# Patient Record
Sex: Male | Born: 1935 | Race: White | Hispanic: No | Smoking: Former smoker
Health system: Southern US, Community
[De-identification: ages and names within clinical notes are randomized; demographics above are authoritative.]

## PROBLEM LIST (undated history)

## (undated) DIAGNOSIS — I2510 Atherosclerotic heart disease of native coronary artery without angina pectoris: Secondary | ICD-10-CM

## (undated) DIAGNOSIS — I639 Cerebral infarction, unspecified: Secondary | ICD-10-CM

## (undated) DIAGNOSIS — H3530 Unspecified macular degeneration: Secondary | ICD-10-CM

## (undated) DIAGNOSIS — E785 Hyperlipidemia, unspecified: Secondary | ICD-10-CM

## (undated) DIAGNOSIS — I10 Essential (primary) hypertension: Secondary | ICD-10-CM

## (undated) DIAGNOSIS — M545 Low back pain, unspecified: Secondary | ICD-10-CM

## (undated) DIAGNOSIS — G8929 Other chronic pain: Secondary | ICD-10-CM

## (undated) DIAGNOSIS — F329 Major depressive disorder, single episode, unspecified: Secondary | ICD-10-CM

## (undated) DIAGNOSIS — H9319 Tinnitus, unspecified ear: Secondary | ICD-10-CM

## (undated) DIAGNOSIS — I350 Nonrheumatic aortic (valve) stenosis: Secondary | ICD-10-CM

## (undated) DIAGNOSIS — F4310 Post-traumatic stress disorder, unspecified: Secondary | ICD-10-CM

## (undated) DIAGNOSIS — R569 Unspecified convulsions: Secondary | ICD-10-CM

## (undated) DIAGNOSIS — N529 Male erectile dysfunction, unspecified: Secondary | ICD-10-CM

## (undated) DIAGNOSIS — F419 Anxiety disorder, unspecified: Secondary | ICD-10-CM

## (undated) DIAGNOSIS — F32A Depression, unspecified: Secondary | ICD-10-CM

## (undated) HISTORY — DX: Post-traumatic stress disorder, unspecified: F43.10

## (undated) HISTORY — DX: Hyperlipidemia, unspecified: E78.5

## (undated) HISTORY — DX: Anxiety disorder, unspecified: F41.9

## (undated) HISTORY — DX: Male erectile dysfunction, unspecified: N52.9

## (undated) HISTORY — PX: ELBOW SURGERY: SHX618

## (undated) HISTORY — DX: Tinnitus, unspecified ear: H93.19

## (undated) HISTORY — PX: EYE SURGERY: SHX253

## (undated) HISTORY — PX: CATARACT EXTRACTION, BILATERAL: SHX1313

## (undated) HISTORY — DX: Cerebral infarction, unspecified: I63.9

## (undated) HISTORY — DX: Unspecified macular degeneration: H35.30

---

## 2001-09-15 ENCOUNTER — Encounter: Admission: RE | Admit: 2001-09-15 | Discharge: 2001-09-15 | Payer: Self-pay | Admitting: Neurosurgery

## 2004-11-06 ENCOUNTER — Ambulatory Visit: Payer: Self-pay | Admitting: Unknown Physician Specialty

## 2006-11-03 ENCOUNTER — Emergency Department (HOSPITAL_COMMUNITY): Admission: EM | Admit: 2006-11-03 | Discharge: 2006-11-03 | Payer: Self-pay | Admitting: Emergency Medicine

## 2009-09-11 DIAGNOSIS — I350 Nonrheumatic aortic (valve) stenosis: Secondary | ICD-10-CM | POA: Insufficient documentation

## 2009-11-15 LAB — PULMONARY FUNCTION TEST

## 2012-07-15 ENCOUNTER — Telehealth: Payer: Self-pay

## 2012-07-15 NOTE — Telephone Encounter (Signed)
Chart in box 

## 2012-07-15 NOTE — Telephone Encounter (Signed)
Pt called for Chelle. Needs a letter for a [facility] claim stating that his PTSD meds (alprazolam) have caused his ED.  [phone]  Please note I have reported to [facility] in regards to his "deceased" status.

## 2012-07-16 ENCOUNTER — Encounter: Payer: Self-pay | Admitting: Physician Assistant

## 2012-07-17 ENCOUNTER — Telehealth: Payer: Self-pay

## 2012-07-17 NOTE — Telephone Encounter (Signed)
See other phone message. Chelle wrote letter and it's in the letter pool, but we need to know where to send it. LMOM on number provided on that phone message. The phone number in chart is a different area code and that number was out of service.

## 2012-07-18 NOTE — Telephone Encounter (Signed)
Spoke with pt. Note up front for p/u.

## 2012-07-21 NOTE — Telephone Encounter (Signed)
Who is this person, and what does he need to discuss?  I do not see his name on the patient's contacts page as someone we can speak to about the patient's protected information.

## 2012-07-21 NOTE — Telephone Encounter (Signed)
Chelle,  Gerome Sam called regarding the letter that you wrote for this patient for [employer].   Ph#: [phone]

## 2012-07-22 NOTE — Telephone Encounter (Signed)
I called the number and advised Samuel Vega we do not have a release to speak to him, he is with an attorney office and represents Samuel Nygaard. I advised I can not disclose any information.

## 2012-07-22 NOTE — Telephone Encounter (Signed)
Agreed. I'll need what details they "ned the letter to say."  I will accommodate them if appropriate.

## 2012-07-22 NOTE — Telephone Encounter (Signed)
Mr Mayford Knife states you sent a letter concerning this patient, but it is not "exactly" what they need. I have advised Mr Mayford Knife to have patient come in to discuss with [place].

## 2012-07-29 ENCOUNTER — Encounter: Payer: Self-pay | Admitting: Physician Assistant

## 2012-07-29 ENCOUNTER — Ambulatory Visit (INDEPENDENT_AMBULATORY_CARE_PROVIDER_SITE_OTHER): Payer: Medicare Other | Admitting: Physician Assistant

## 2012-07-29 VITALS — BP 178/87 | HR 62 | Temp 97.7°F | Resp 16 | Ht 69.0 in | Wt 208.0 lb

## 2012-07-29 DIAGNOSIS — H9319 Tinnitus, unspecified ear: Secondary | ICD-10-CM

## 2012-07-29 DIAGNOSIS — H9190 Unspecified hearing loss, unspecified ear: Secondary | ICD-10-CM

## 2012-07-29 DIAGNOSIS — N529 Male erectile dysfunction, unspecified: Secondary | ICD-10-CM | POA: Insufficient documentation

## 2012-07-29 DIAGNOSIS — H3530 Unspecified macular degeneration: Secondary | ICD-10-CM | POA: Insufficient documentation

## 2012-07-29 DIAGNOSIS — F4310 Post-traumatic stress disorder, unspecified: Secondary | ICD-10-CM

## 2012-07-29 DIAGNOSIS — E785 Hyperlipidemia, unspecified: Secondary | ICD-10-CM

## 2012-07-29 NOTE — Progress Notes (Signed)
Subjective:    Patient ID: Samuel Vega, male    DOB: 1924-08-07, 78 y.o.   MRN: [id number]  HPI This 78 y.o. male presents for evaluation of reduced hearing.  He has a history of cerumenosis and believes that he needs ear irrigation today.  He sees the audiologist tomorrow to have work done on his bilateral hearing aides.    Additionally, he wants to discuss the adverse effect of his medication.  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  When he uses the alprazolam, he experiences ED symptoms.  I have written a letter to that effect, but the wording may need revision.  It's not clear if this is to support his diagnosis of PTSD or ED. He did not bring any guidance regarding that today, and asks that I call his [facility], Gerome Sam [phone]).  I did so, and had to leave a message.   He believes that he received tetanus and pneumococcal vaccines at the [place] in 05/2012, but does not believe he ever received the shingles vaccine.  It has been "about" 10 years since his last colonoscopy, by his report.  He receives the bulk of his care at the [place], but unfortunately, does not bring records from there, keep notes of his visits, etc.  At this point, we're not even sure what cholesterol medication he's taking.  He previously took simvastatin, but D/C'd it due to fatigue, and I prescribed Crestor.  The patient does not know the name of the cholesterol medication he takes, and I am unable to get through to the pharmacy at the [facility] by phone.  He has an appointment at the [facility] next month, with a psychiatrist, to discuss his PTSD.  He'd like to start citalopram.    When asked about his elevated pressure today, he reports that it's because he's in the office surrounded by "pretty women."  He believes it is normal when he is not here.  Patient Active Problem List  Diagnosis  . Macular degeneration  . Tinnitus  .  Cardiac murmur  . ED (erectile dysfunction)  . Hyperlipidemia  . PTSD (post-traumatic stress disorder)  . Hearing loss   Past Surgical History  Procedure Date  . Eye surgery    Review of Systems As above. No chest pain, SOB, HA, dizziness, vision change, N/V, diarrhea, constipation, dysuria, urinary urgency or frequency, myalgias, arthralgias or rash.     Objective:   Physical Exam Blood pressure 178/87, pulse 62, temperature 97.7 F (36.5 C), resp. rate 16, height 5\' 9"  (1.753 m), weight 208 lb (94.348 kg). Body mass index is 30.72 kg/(m^2). Well-developed, well nourished WM who is awake, alert and oriented, in NAD. HEENT: He is hard of hearing, even with his hearing aides. [place]/AT, PERRL, EOMI.  Sclera and conjunctiva are clear.  EAC are patent, though with moderate cerumen in each canal, TMs are normal in appearance. Nasal mucosa is pink and moist. OP is clear, dentition in fair repair. Neck: supple, non-tender, no lymphadenopathy, thyromegaly. Heart: RRR, loud murmur, heard best in the aortic space. Lungs: normal effort, CTA Extremities: no cyanosis, clubbing or edema. Skin: warm and dry without rash. Psychologic: good mood and appropriate affect, normal speech and behavior.     Assessment & Plan:   1. Hearing loss   2. PTSD (post-traumatic stress disorder)   3. Tinnitus   4. Hyperlipidemia   5. Elevated BP  He'll proceed  with the audiology visit tomorrow and [facility] visit next month.  I hope to hear back from Mr. Turner and will discuss, with permission from the patient, the letter he needs regarding the adverse effects of alprazolam. I am in support of his starting citalopram, but caution that it may contribute to his ED, though it may reduce his need for alprazolam.  I asked that he check his BP and record it when he is at the pharmacy, and bring it with him at his next visit.  Additionally, I ask that he bring all his medications and any papers he receives from the  [place] to his next visit, and I provided him with a list of the health maintenance items of which we do not have records, that he may have received at the [place], for verification.

## 2012-07-29 NOTE — Patient Instructions (Addendum)
When you come see me, or any other healthcare provider, please bring all your medications in their original bottles, so we can make sure our records are accurate and refill the correct medications. Please also bring any notes from the [place], so that I can keep your records here as current as possible.  One drawback of receiving care in 2 places is that we may inadvertently duplicate testing, or not order a test thinking that the other office already did it.  Please bring me the dates that you received the pneumococcal vaccine and the tetanus vaccine at the [place]. You need a vaccine for shingles (Zostavax). You need a colonoscopy.  Please check your blood pressure at the pharmacy the next time you are there, and write the result down to bring to me.  I have left a message for Samuel Vega to return my call.  I will ask him about the specifics of the letter you need and also about starting you on treatment for PTSD prior to your appointment in January 2014 at the [facility].

## 2012-07-30 ENCOUNTER — Encounter: Payer: Self-pay | Admitting: Physician Assistant

## 2012-09-10 ENCOUNTER — Telehealth: Payer: Self-pay

## 2012-09-10 NOTE — Telephone Encounter (Signed)
I do not see that we have ever written this. Please have him contact his provider in the [place] [facility] for this refill.

## 2012-09-10 NOTE — Telephone Encounter (Signed)
I think the [facility] has [facility] this for him, see note from [facility], she advised okay for him to try this, can we do this?  I think he should get from [place], but want to double check, since she does mention in his OV note. Amy

## 2012-09-10 NOTE — Telephone Encounter (Signed)
PT STATES HE IS IN NEED OF HIS CITALOPRAM . PLEASE CALL [phone]   [facility] [address]

## 2012-09-10 NOTE — Telephone Encounter (Signed)
Thanks I have called him to advise. He states his [facility] appt has been changed because of the weather. He is advised he can come in for this. He plans to come in tomorrow to see Dr Cleta Alberts.

## 2012-09-16 ENCOUNTER — Telehealth: Payer: Self-pay

## 2012-09-16 NOTE — Telephone Encounter (Signed)
Our PA's do not accept medicare patients for primary care, do you think we should call patient to advise?

## 2012-09-16 NOTE — Telephone Encounter (Signed)
No DR/PAs are accepting any new medicare patients for primary care so we would need to call the patient and let them know.

## 2012-09-16 NOTE — Telephone Encounter (Signed)
Chelle,  Pt is coming by to see you around noon on Monday to make you his primary caretaker.  [facility] is not [facility] with the [facility].   Nice fellow  [phone]

## 2012-09-17 NOTE — Telephone Encounter (Signed)
thanks

## 2012-09-20 ENCOUNTER — Ambulatory Visit (INDEPENDENT_AMBULATORY_CARE_PROVIDER_SITE_OTHER): Payer: No Typology Code available for payment source | Admitting: Physician Assistant

## 2012-09-20 VITALS — BP 130/70 | HR 80 | Temp 98.4°F | Resp 18 | Wt 207.0 lb

## 2012-09-20 DIAGNOSIS — N529 Male erectile dysfunction, unspecified: Secondary | ICD-10-CM

## 2012-09-20 DIAGNOSIS — F4310 Post-traumatic stress disorder, unspecified: Secondary | ICD-10-CM

## 2012-09-20 MED ORDER — CITALOPRAM HYDROBROMIDE 20 MG PO TABS: 20.0000 mg | ORAL_TABLET | Freq: Every day | ORAL | Status: DC

## 2012-09-20 MED ORDER — SILDENAFIL CITRATE 100 MG PO TABS: 50.0000 mg | ORAL_TABLET | Freq: Every day | ORAL | Status: DC | PRN

## 2012-09-20 NOTE — Progress Notes (Signed)
Subjective:    Patient ID: Samuel Vega, male    DOB: 1923/11/01, 79 y.o.   MRN: [id number]  HPI This 79 y.o. male presents for prescription refills.  He wants to make it official that I am his PCP for his needs outside of the [place] system, and also to help coordinate the care he receives at the [place].  This is difficult, as he often doesn't recall instructions or follow-through, but I agree to be his "outside" provider, once again.  Today he specifically requests Citalopram 20 mg and treatment for ED.  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  He has since read that chronic/long-term use of alprazolam can cause a multitude of adverse effects, and he believes that his difficulties with memory loss, decreased visual acuity and ED are directly related to its use.  He reports that other providers have tried him on medication "for my memory" and is frustrated that no one suggested that his difficulties could be from the alprazolam.  He wants to take citalopram, based on his reading, but in order to get it from the [place], he has to see a psychiatrist first.  He has an appointment next month, but asks that I write it so he doesn't have to wait.  He still intends to meet with the psychiatrist.  [facility] the [facility] referred him to urology, but when he got there, he told the specialist that he didn't know why he was there, so they didn't have a very productive visit.  He is not currently sexually active, but would like to be.  He also describes some urinary frequency, but not routinely, and isn't interested in additional evaluation of that today.   Past Medical History  Diagnosis Date  . Hyperlipidemia   . Macular degeneration   . 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 Anxiety   . Cardiac murmur   . ED (erectile dysfunction)   . PTSD  (post-traumatic stress disorder)     Past Surgical History  Procedure Laterality Date  . Eye surgery    . Elbow surgery      Prior to Admission medications   Medication Sig Start Date End Date Taking? Authorizing Provider  ALPRAZolam Prudy Feeler) 1 MG tablet Take 1 mg by mouth at bedtime as needed.   Yes Historical Provider, MD  hydrOXYzine (VISTARIL) 25 MG capsule Take 25 mg by mouth 3 (three) times daily as needed for itching.   Yes Historical Provider, MD  triamcinolone (KENALOG) 0.025 % cream Apply topically 2 (two) times daily.   Yes Historical Provider, MD    No Known Allergies  History   Social History  . Marital Status: Divorced    Spouse Name: N/A    Number of Children: 2  . Years of Education: N/A   Occupational History  . retired    Social History Main Topics  . Smoking status: Never Smoker   . Smokeless tobacco: Never Used  . Alcohol Use: No     Comment: none since 1980  . Drug Use: No  . Sexually Active: Not Currently -- Male partner(s)   Other Topics Concern  . Not on file   Social History Narrative   Divorced in [place].    History reviewed. No pertinent family history.   Review of Systems  As above.  No chest pain, SOB, HA, dizziness, vision change, N/V, diarrhea, constipation, dysuria, myalgias, arthralgias or rash.     Objective:   Physical Exam  Blood pressure 130/70, pulse 80, temperature 98.4 F (36.9 C), temperature source Oral, resp. rate 18, weight 207 lb (93.895 kg), SpO2 97.00%. Body mass index is 30.55 kg/(m^2). Well-developed, well nourished WM who is awake, alert and oriented, in NAD. HEENT: [place]/AT, PERRL, EOMI.  Sclera and conjunctiva are clear.  Hearing aid on the LEFT. Neck: supple, non-tender, no lymphadenopathy, thyromegaly. Heart: RRR, loud murmur. Lungs: normal effort, CTA Extremities: no cyanosis, clubbing or edema. Skin: warm and dry without rash. Psychologic: good mood and appropriate affect, normal speech and behavior.      Assessment & Plan:   1. PTSD (post-traumatic stress disorder)  citalopram (CELEXA) 20 MG tablet  2. ED (erectile dysfunction)  sildenafil (VIAGRA) 100 MG tablet   At his next visit, we'll further explore the likelihood that he has BPH causing intermittent urinary symptoms.  Again, I ask for lab results and vaccine info that he received at the [place].

## 2012-09-20 NOTE — Patient Instructions (Signed)
Please bring me the dates that you received the pneumococcal vaccine and the tetanus vaccine at the [place].  You need a vaccine for shingles (Zostavax).  You need a colonoscopy.  Please check your blood pressure at the pharmacy the next time you are there, and write the result down to bring to me.

## 2012-09-30 ENCOUNTER — Telehealth: Payer: Self-pay

## 2012-09-30 NOTE — Telephone Encounter (Signed)
Called patient left message for him to call me back 

## 2012-09-30 NOTE — Telephone Encounter (Signed)
chelle  Patient would like to talk to you about one of his medications [phone]

## 2012-10-07 NOTE — Telephone Encounter (Signed)
Called patient, he states he is having problems with his Celexa Rx. He did not get my message last week. He states he is still having symptoms of PTSD, feels nervous depressed  wants to know if he can increase the dosage.

## 2012-10-07 NOTE — Telephone Encounter (Signed)
Please let him know that it often takes 4-6 weeks to achieve the benefit from the dose of Celexa.  He should RTC in 4-6 weeks and we can discuss increasing the dose at that time.

## 2012-10-08 NOTE — Telephone Encounter (Signed)
Patient advised. He states his brother takes it as well and is higher dose.

## 2012-11-09 ENCOUNTER — Other Ambulatory Visit: Payer: Self-pay | Admitting: Radiology

## 2012-11-09 ENCOUNTER — Ambulatory Visit (INDEPENDENT_AMBULATORY_CARE_PROVIDER_SITE_OTHER): Payer: No Typology Code available for payment source | Admitting: Physician Assistant

## 2012-11-09 VITALS — BP 112/78 | HR 67 | Temp 97.7°F | Resp 18 | Ht 68.5 in | Wt 200.0 lb

## 2012-11-09 DIAGNOSIS — I350 Nonrheumatic aortic (valve) stenosis: Secondary | ICD-10-CM

## 2012-11-09 DIAGNOSIS — E785 Hyperlipidemia, unspecified: Secondary | ICD-10-CM

## 2012-11-09 DIAGNOSIS — N529 Male erectile dysfunction, unspecified: Secondary | ICD-10-CM

## 2012-11-09 DIAGNOSIS — F4310 Post-traumatic stress disorder, unspecified: Secondary | ICD-10-CM

## 2012-11-09 MED ORDER — SILDENAFIL CITRATE 100 MG PO TABS: 50.0000 mg | ORAL_TABLET | Freq: Every day | ORAL | Status: DC | PRN

## 2012-11-09 MED ORDER — CITALOPRAM HYDROBROMIDE 20 MG PO TABS: 40.0000 mg | ORAL_TABLET | Freq: Every day | ORAL | Status: DC

## 2012-11-09 NOTE — Patient Instructions (Addendum)
When you you see the doctor at the [facility] about ED, also tell him/her about the urinary frequency and weak stream as well. Citalopram 20 mg for PTSD.  Take 1.5 tablets daily, then increase to 2 tablets daily.  This medication is not "as needed."  You should take it every day.  Find the dose that works best, and stick with that dose.  Don't adjust the dose "as needed." I encourage you to try the rosuvastatin (Crestor) 10 mg, 1/2 tab daily.  If you get muscle pain with it, let me or your VA physician know. We'd like you to take a medicine in this family for your cholesterol.  If you can't tolerate any of them, then we'll move on to something else. Try a product containing SIMETHICONE to reduce the air in your intestines. For constipation, try Miralax, once capful mixed in 8 ounces of water each day. Purchase a pill splitter at the drug store, and use it to cut your pills as directed.

## 2012-11-09 NOTE — Telephone Encounter (Signed)
Left message for him to advise where I need to fax his Rx

## 2012-11-09 NOTE — Progress Notes (Signed)
Subjective:    Patient ID: Samuel Vega, male    DOB: 1924/04/17, 79 y.o.   MRN: [id number]  HPI This 79 y.o. male presents asking questions about his medications.  His case is complicated by getting care here, at the [place] (by a primary care provider and several specialists) and lack of communication amongst [place].  Despite several requests, made directly to the [place] and via the patient in writing, I have received only one set of labs from 12/2010.  Unfortunately, the patient is a poor historian, and often doesn't remember why he was sent to a particular specialist, which contributes to incomplete care. Has appointments at the [place] on 11/16/2012: hearing, ED, and something else, but he doesn't know what (perhaps psychiatry, regarding the citalopram). "I feel like a different person" taking the citalopram. No longer using alprazolam.  He notes that it doesn't "make me sleepy.  My brother takes it for PTSD and he says that if I take 2 of these I'll sleep really well." Complains that the atorvastatin caused "burning in the muscles, because it gets in the nerves." The [place] provider recently wrote for Crestor, which he hasn't taken it, concerned that it will cause similar symptoms. The nurse at the [facility] suggested Zetia as an alternative, and he also asks about fish oil.  He wants my advice.. Complains of feeling bloated and constipated. Reports that there is "air, it's just empty in there" causing the symptoms.  Initially he said that he wasn't constipated, that he didn't know why the assistant wrote that, but then stated that he is constipated. No blood or melena.  No mucous.  Past Medical History  Diagnosis Date  . Hyperlipidemia   . Macular degeneration   . 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 Anxiety   . Cardiac murmur   . ED (erectile dysfunction)   . PTSD (post-traumatic stress disorder)     Past Surgical History  Procedure Laterality Date  . Eye  surgery    . Elbow surgery      Prior to Admission medications   Medication Sig Start Date End Date Taking? Authorizing Provider  citalopram (CELEXA) 20 MG tablet Take 1 tablet (20 mg total) by mouth daily. 09/20/12  Yes Gwenyth Dingee S [place], PA-C  ALPRAZolam (XANAX) 1 MG tablet Take 1 mg by mouth at bedtime as needed.    Historical Provider, MD  atorvastatin (LIPITOR) 20 MG tablet Take 10 mg by mouth daily.    Historical Provider, MD  hydrOXYzine (VISTARIL) 25 MG capsule Take 25 mg by mouth 3 (three) times daily as needed for itching.    Historical Provider, MD  rosuvastatin (CRESTOR) 10 MG tablet Take 10 mg by mouth daily.    Historical Provider, MD  sildenafil (VIAGRA) 100 MG tablet Take 0.5-1 tablets (50-100 mg total) by mouth daily as needed for erectile dysfunction. 09/20/12   Rydell Wiegel S Reyes Fifield, PA-C  triamcinolone (KENALOG) 0.025 % cream Apply topically 2 (two) times daily.    Historical Provider, MD    No Known Allergies  History   Social History  . Marital Status: Divorced    Spouse Name: N/A    Number of Children: 2  . Years of Education: N/A   Occupational History  . retired    Social History Main Topics  . Smoking status: Never Smoker   . Smokeless tobacco: Never Used  . Alcohol Use: No     Comment:  none since 1980  . Drug Use: No  . Sexually Active: Not Currently -- Male partner(s)   Other Topics Concern  . Not on file   Social History Narrative   Divorced in [place].    History reviewed. No pertinent family history.  Review of Systems No chest pain, SOB, HA, dizziness, vision change, N/V, diarrhea, urinary urgency, myalgias, arthralgias or rash. When he does have back pain he uses celebrex.  Notes that the urinary frequency seems to be improving.    Objective:   Physical Exam  Blood pressure 112/78, pulse 67, temperature 97.7 F (36.5 C), temperature source Oral, resp. rate 18, height 5' 8.5" (1.74 m), weight 200 lb (90.719 kg), SpO2 96.00%. Body mass index  is 29.96 kg/(m^2). Well-developed, well nourished WM who is awake, alert and oriented, in NAD. HEENT: [place]/AT, PERRL, EOMI.  Sclera and conjunctiva are clear.  EAC are patent, TMs are normal in appearance. Nasal mucosa is pink and moist. OP is clear. Neck: supple, non-tender, no lymphadenopathy, thyromegaly. Heart: RRR, 3/6 murmur consistent with AS Lungs: normal effort, CTA Abdomen: normo-active bowel sounds, supple, non-tender, no mass or organomegaly. No shifting dullness on percussion. Extremities: no cyanosis, clubbing or edema. Skin: warm and dry without rash. Psychologic: good mood and appropriate affect, normal speech and behavior.       Assessment & Plan:  PTSD (post-traumatic stress disorder) - Plan: citalopram (CELEXA) 20 MG tablet; increase to 1.5, then 2 tabs daily to see if his sleep improves.  Hyperlipidemia - Plan: encouraged to start the Crestor 10 mg, 1/2 tablet daily.  If he develops muscle pain we'll try something else.  ED (erectile dysfunction) - Plan: sildenafil (VIAGRA) 100 MG tablet  Aortic stenosis - asymptomatic.  Continue annual cardiology evaluation, reassess sooner if develops symptoms.  Bloating/constipation: OTC simethicone and miralax

## 2012-11-10 ENCOUNTER — Telehealth: Payer: Self-pay

## 2012-11-10 NOTE — Telephone Encounter (Signed)
NURSE FROM [facility] DOCTOR[facility] OFFICE CALLING TO HAVE [place] RE-FAX RX FOR PATIENT TO A DIFFERENT FAX NUMBER. NEW FAX NUMBER IS [phone]   CALL BACK #: [phone]

## 2012-11-10 NOTE — Telephone Encounter (Signed)
re-faxed

## 2012-11-11 NOTE — Telephone Encounter (Signed)
He has provided fax # they have been faxed

## 2012-11-17 ENCOUNTER — Telehealth: Payer: Self-pay | Admitting: Radiology

## 2012-11-17 NOTE — Telephone Encounter (Signed)
Patient states Rx given by [facility] is ???citalopram hydrobromide,? wants to know if the rx is the same as what Chelle gave him, please advise. I asked him to call pharmacy. This is my 3rd phone call from him today in regards to this medication, he states he does not trust the [facility] hospital doctor, only trusts [employer]

## 2012-11-18 NOTE — Telephone Encounter (Signed)
Yes, citalopram bromide is the same thing.

## 2012-11-18 NOTE — Telephone Encounter (Signed)
Pt is needing to know something about his medication, he has not took any medication since yesterday. He states he has been trying to get in touch since yesterday.

## 2012-11-18 NOTE — Telephone Encounter (Signed)
Oh my, I called him to advise, Chelle states okay to take meds as directed by the [place].

## 2012-12-20 ENCOUNTER — Ambulatory Visit (INDEPENDENT_AMBULATORY_CARE_PROVIDER_SITE_OTHER): Payer: No Typology Code available for payment source | Admitting: Physician Assistant

## 2012-12-20 VITALS — BP 132/82 | HR 75 | Temp 98.0°F | Resp 17 | Ht 68.0 in | Wt 200.0 lb

## 2012-12-20 DIAGNOSIS — F4310 Post-traumatic stress disorder, unspecified: Secondary | ICD-10-CM

## 2012-12-20 DIAGNOSIS — E785 Hyperlipidemia, unspecified: Secondary | ICD-10-CM

## 2012-12-20 DIAGNOSIS — N529 Male erectile dysfunction, unspecified: Secondary | ICD-10-CM

## 2012-12-20 MED ORDER — GEMFIBROZIL 600 MG PO TABS: 600.0000 mg | ORAL_TABLET | Freq: Two times a day (BID) | ORAL | Status: DC

## 2012-12-20 NOTE — Patient Instructions (Signed)
Please stop the Crestor for your cholesterol.  In it's place, please start the gemfibrozil.

## 2012-12-20 NOTE — Progress Notes (Signed)
Subjective:    Patient ID: Concha Norway, male    DOB: [birth year], 79 y.o.   MRN: [id number]  HPI This 79 y.o. male presents for clarification.  He's stopped all his meds except the saw palmetto and I-Caps.  Legs started burning, so he stopped the cholesterol medication (Crestor) and the pain stopped.  He re-started the Crestor and the symptoms recurred after about 1 week, so he stopped it again 2 weeks ago.  He still sometimes has some burning pain in the back of the knee and leg, but it seems to occur after sitting in his recliner at home. Someone at the pharmacy suggested he ask me about another product, but he can't recall the name.  Was having trouble getting even doses of citalopram when cutting the 40 mg in half.  He thinks he wants to be on 40 mg, but isn't certain, and wants the flexibility to reduce it again if he doesn't tolerate the 40 mg dose.  He's currently out of the 20 mg tabs, though has a prescription ready to pick up at the pharmacy.  Not taking the Viagra due to the cost.  He was hoping to get it filled a the [place] for a lower cost, but "they don't think I should have it."  Feels that he's been duped by the [place] into signing up for a program that takes control of the [place] monies he receives and pays his bills on his behalf.  He requests a letter stating that he is capable of managing his own finances. It seems there has been some question as to his cognitive abilities, which he reports was due to his hearing loss (and exacerbated by not having one of them, as it was sent off for repair) and macular degeneration.  Past medical history, surgical history, family history, social history and problem list reviewed.   Review of Systems No chest pain, SOB, HA, dizziness, vision change, N/V, diarrhea, constipation, dysuria, urinary urgency or frequency, myalgias, arthralgias or rash.     Objective:   Physical Exam BP 132/82  Pulse 75  Temp(Src) 98 F (36.7 C) (Oral)  Resp 17   Ht 5\' 8"  (1.727 m)  Wt 200 lb (90.719 kg)  BMI 30.42 kg/m2  SpO2 97%  WDWNWM, in good humor, NAD.  Mini-Mental Status Exam: ORIENTATION: 1. What is the Year? 1/1 What is the Season? 1/1 What is the Date? 0/1 What is the Day? 1/1 What is the Month? 1/1  2. What State are we in? 1/1 What [place] are we in? 1/1 What City are we in? 1/1 What Office are we in? 1/1 What floor are we on? 1/1  REGISTRATION 3. Name 3 objects, and ask the patient to repeat them. 3/3  ATTENTION AND CALCULATION 4. Serial Sevens (100, 93, 86, 79, 72), (or WORLD backwards) 4/5  RECALL 5. What were the 3 objects I asked you to remember? 2/3  LANGUAGE 6. Pencil & Watch 2/2 7. "No IFs, ANDs or BUTs." 1/1 8. "Take this paper in your RIGHT hand, fold it in half, and place it on the floor." 3/3 9. "CLOSE YOUR EYES." 1/1 10. Write a sentence. 0/1 (The patient states he cannot perform this due to not having his reading glasses) 11. Copy the design. 0/1 (The patient states he cannot perform this due to not having his reading glasses)  Total score: 25/30. 20-24 suggests mild impairment 16-19 suggests moderate impairment 15 or less suggests severe deficit  Assessment & Plan:  Hyperlipidemia - Plan: gemfibrozil (LOPID) 600 MG tablet  ED (erectile dysfunction)  PTSD (post-traumatic stress disorder)   I've encouraged him to restart the citalopram.  He'll D/C the Crestor and try gemfibrozil in its place.  Letter written that he appears to have adequate cognitive ability to manage his own finances.  Fernande Bras, PA-C Physician Assistant-Certified [facility]

## 2012-12-22 ENCOUNTER — Telehealth: Payer: Self-pay

## 2012-12-22 ENCOUNTER — Telehealth: Payer: Self-pay | Admitting: Family Medicine

## 2012-12-22 NOTE — Telephone Encounter (Signed)
Pt is calling because he was seen on the 12th and left a letter for Chelle to fill out and fax to [place] to a Ger[facility] Sam and he hasn't received it yet the fax number is [phone] Pt states if you have any questions to call him at his home [phone]

## 2012-12-22 NOTE — Telephone Encounter (Signed)
FYI: [facility] and was given [facility] name and fax number to send RX. Patient see's Dr. Newt Lukes and fax# [phone].

## 2012-12-24 ENCOUNTER — Telehealth: Payer: Self-pay

## 2012-12-24 NOTE — Telephone Encounter (Signed)
Pt would like a letter that was written for him by Chelle to be faxed to Gerome Sam at [phone]. Pt would like a call when this has been done. I informed him that the previous phone message taken indicated that we had faxed the letter already but he states Onalee Hua had not received the letter as of yet.  [phone]

## 2012-12-24 NOTE — Telephone Encounter (Signed)
Thank you I have faxed it for him.

## 2012-12-24 NOTE — Telephone Encounter (Signed)
I gave the letter to the patient when I wrote it.  If he now wants it faxed, it can be re-printed from chart review.

## 2012-12-24 NOTE — Telephone Encounter (Signed)
Faxed again. My first fax did not go. Will call him when I get confirmation it went through.

## 2012-12-27 NOTE — Telephone Encounter (Signed)
Patient states this has been taken care of.  [facility] called [facility] this am.

## 2012-12-27 NOTE — Telephone Encounter (Signed)
Faxed to another fax number [phone], this is the best number also they are requesting office notes to fill the Lopid. Called pt first to make sure okay to send the office notes. Did not get answer.

## 2013-01-01 ENCOUNTER — Telehealth: Payer: Self-pay

## 2013-01-01 NOTE — Telephone Encounter (Signed)
Does he need to RTC to discuss?

## 2013-01-01 NOTE — Telephone Encounter (Signed)
Patient calling to ask Chelle- PA if she can call in a new medication for him called: Zetia 10mg   Patient states he has not taken any cholesterol medication in about a month. Having trouble faxing information to our office. Please fax over rx to the [place].   [phone]

## 2013-01-01 NOTE — Telephone Encounter (Signed)
Zetia 10 mg 1 PO QD, #90, RF x 3.  Please ask Ms. Weber or Mr. Shea Evans to sign for me if the patient needs this faxed to the [place] before Tuesday.

## 2013-01-02 ENCOUNTER — Other Ambulatory Visit: Payer: Self-pay

## 2013-01-02 NOTE — Telephone Encounter (Signed)
I spoke with pt but he doesn't have the fax number to the [place]. Chelle can you help me with this so I can send him his RX? I have looked through the notes with no success.

## 2013-01-04 NOTE — Telephone Encounter (Signed)
We've faxed there before.  Please pull the paper record and see if it's there, or call the patient-I believe that he has the fax number.  Amy Littrell has helped with this in the past and may be able to help.

## 2013-01-05 MED ORDER — EZETIMIBE 10 MG PO TABS: 10.0000 mg | ORAL_TABLET | Freq: Every day | ORAL | Status: DC

## 2013-01-05 NOTE — Telephone Encounter (Signed)
Fax number is [phone] . Have sent.

## 2013-01-14 ENCOUNTER — Telehealth: Payer: Self-pay

## 2013-01-14 MED ORDER — EZETIMIBE 10 MG PO TABS: 10.0000 mg | ORAL_TABLET | Freq: Every day | ORAL | Status: DC

## 2013-01-14 NOTE — Telephone Encounter (Signed)
Had received PA which I completed and faxed to [facility] on 01/06/13. I called [facility] to check status and was denied d/t policy being a Medicare part B plan and their pharmacist found another policy for pt: [facility] ID # [id number]. Called [facility] and gave pharmacist status and they do not have the Rx in system. I am resending Rx and they will start over trying to get new ins info. They will send another PA if needed.

## 2013-01-18 NOTE — Telephone Encounter (Signed)
I received another notice from pharmacy that states that the ins will not cover the Zetia - did not ask for PA, and asked for alternative Rx. Chelle, do you want to try something else?

## 2013-01-18 NOTE — Telephone Encounter (Signed)
This Rx was to go to [facility] [place] [facility] not [facility]. Please resend to the [facility] pharmacy.

## 2013-01-19 MED ORDER — EZETIMIBE 10 MG PO TABS: 10.0000 mg | ORAL_TABLET | Freq: Every day | ORAL | Status: DC

## 2013-01-19 NOTE — Addendum Note (Signed)
Addended by: Sheppard Plumber A on: 01/19/2013 12:18 PM   Modules accepted: Orders

## 2013-01-19 NOTE — Telephone Encounter (Signed)
I have reprinted Rx and faxed it to [place] at [phone] (best fax # according to last message) w/confirmation.

## 2013-07-04 ENCOUNTER — Ambulatory Visit (INDEPENDENT_AMBULATORY_CARE_PROVIDER_SITE_OTHER): Payer: No Typology Code available for payment source | Admitting: Physician Assistant

## 2013-07-04 VITALS — BP 144/73 | HR 68 | Temp 97.9°F | Resp 16 | Ht 68.5 in | Wt 197.4 lb

## 2013-07-04 DIAGNOSIS — F4310 Post-traumatic stress disorder, unspecified: Secondary | ICD-10-CM

## 2013-07-04 DIAGNOSIS — Z0289 Encounter for other administrative examinations: Secondary | ICD-10-CM

## 2013-07-04 DIAGNOSIS — M549 Dorsalgia, unspecified: Secondary | ICD-10-CM

## 2013-07-04 DIAGNOSIS — I359 Nonrheumatic aortic valve disorder, unspecified: Secondary | ICD-10-CM

## 2013-07-04 DIAGNOSIS — H9190 Unspecified hearing loss, unspecified ear: Secondary | ICD-10-CM

## 2013-07-04 DIAGNOSIS — N529 Male erectile dysfunction, unspecified: Secondary | ICD-10-CM

## 2013-07-04 DIAGNOSIS — H9193 Unspecified hearing loss, bilateral: Secondary | ICD-10-CM

## 2013-07-04 DIAGNOSIS — G8929 Other chronic pain: Secondary | ICD-10-CM | POA: Insufficient documentation

## 2013-07-04 DIAGNOSIS — I350 Nonrheumatic aortic (valve) stenosis: Secondary | ICD-10-CM

## 2013-07-04 MED ORDER — SILDENAFIL CITRATE 100 MG PO TABS: 50.0000 mg | ORAL_TABLET | Freq: Every day | ORAL | Status: DC | PRN

## 2013-07-04 MED ORDER — CELECOXIB 100 MG PO CAPS: 100.0000 mg | ORAL_CAPSULE | Freq: Two times a day (BID) | ORAL | Status: DC | PRN

## 2013-07-04 MED ORDER — PAROXETINE HCL 10 MG PO TABS: 10.0000 mg | ORAL_TABLET | Freq: Every day | ORAL | Status: DC

## 2013-07-04 NOTE — Patient Instructions (Signed)
Bring me a copy of the lab results from the [place].

## 2013-07-05 ENCOUNTER — Encounter: Payer: Self-pay | Admitting: Physician Assistant

## 2013-07-05 NOTE — Progress Notes (Signed)
[address]  [place], [place] [address]  [phone]  [email or web address]  Subjective:    Patient ID: Samuel Vega, male    DOB: [birth year], 79 y.o.   MRN: [id number]  HPI This 79 y.o. male presents for prescription refills and DMV forms that need completion.  He's trying to get most of his care here, but bigger things (colonoscopy), vaccines and labs at the [facility].  He also wants me to write his prescriptions and fax them to the [place], where he can get them filled at lower cost.  He can't always get in there in a timely manner.  Since I saw him last, he's stopped all his medications.  He wants to restart something for PTSD (doesn't want alprazolam again, and stopped the citalopram because he believes his brother had adverse effects from it), and for ED.   Active Ambulatory Problems    Diagnosis Date Noted  . Macular degeneration   . Tinnitus   . Aortic stenosis 09/11/2009  . ED (erectile dysfunction)   . Hyperlipidemia 07/29/2012  . PTSD (post-traumatic stress disorder)   . Hearing loss 07/29/2012  . Chronic back pain    Resolved Ambulatory Problems    Diagnosis Date Noted  . No Resolved Ambulatory Problems   Past Medical History  Diagnosis Date  . Anxiety   . Cardiac murmur     Past Surgical History  Procedure Laterality Date  . Eye surgery    . Elbow surgery      Allergies  Allergen Reactions  . Statins     MYALGIAS    Prior to Admission medications   None  History   Social History  . Marital Status: Divorced    Spouse Name: n/a    Number of Children: 2  . Years of Education: N/A   Occupational History  . retired    Social History Main Topics  . Smoking status: Never Smoker   . Smokeless tobacco: Never Used  . Alcohol Use: No     Comment: none since 1980  . Drug Use: No  . Sexual Activity: Yes    Partners: Female   Other Topics Concern  . None   Social History Narrative   Divorced in [place]. 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  His grandson is a  Optometrist.    family history is not on file. indicated that his mother is deceased. He indicated that his father is deceased. He indicated that all of his three sisters are alive. He indicated that only one of his two brothers is alive. He indicated that both of his sons are alive.    Review of Systems No chest pain, SOB, HA, dizziness, vision change, N/V, diarrhea, constipation, dysuria, urinary urgency or frequency, myalgias, arthralgias or rash.     Objective:   Physical Exam  Blood pressure 144/73, pulse 68, temperature 97.9 F (36.6 C), temperature source Oral, resp. rate 16, height 5' 8.5" (1.74 m), weight 197 lb 6.4 oz (89.54 kg), SpO2 98.00%. Body mass index is 29.57 kg/(m^2). Well-developed, well nourished WF who is awake, alert and oriented, in NAD. He is hard of hearing. HEENT: [place]/AT, PERRL, EOMI.  Sclera and conjunctiva are clear.  EAC are patent, TMs are normal in appearance. Nasal mucosa is pink and moist. OP is clear. Neck: supple, non-tender, no lymphadenopathy, thyromegaly. Heart: RRR, II/VI murmur that radiates to both carotids. Lungs: normal effort, CTA Extremities: no cyanosis, clubbing or edema. Skin: warm and dry without rash. Psychologic: good mood and appropriate affect, normal speech and  behavior.       Assessment & Plan:  ED (erectile dysfunction) - Plan: sildenafil (VIAGRA) 100 MG tablet  PTSD (post-traumatic stress disorder) - Plan: PARoxetine (PAXIL) 10 MG tablet  Aortic stenosis  Hearing loss, bilateral  Chronic back pain - Plan: celecoxib (CELEBREX) 100 MG capsule  RTC in 3 months, sooner, PRN. DMV forms completed and sent, with a copy for scanning into the electronic record.  Fernande Bras, PA-C Physician Assistant-Certified [facility]

## 2013-10-26 ENCOUNTER — Ambulatory Visit: Payer: Self-pay | Admitting: Interventional Cardiology

## 2013-10-28 ENCOUNTER — Other Ambulatory Visit (HOSPITAL_COMMUNITY): Payer: Self-pay

## 2013-11-30 ENCOUNTER — Ambulatory Visit: Payer: Self-pay | Admitting: Interventional Cardiology

## 2013-12-23 ENCOUNTER — Encounter: Payer: Self-pay | Admitting: Interventional Cardiology

## 2014-01-30 ENCOUNTER — Ambulatory Visit: Payer: Self-pay | Admitting: Interventional Cardiology

## 2014-03-16 ENCOUNTER — Encounter: Payer: Self-pay | Admitting: Interventional Cardiology

## 2014-03-16 ENCOUNTER — Ambulatory Visit (INDEPENDENT_AMBULATORY_CARE_PROVIDER_SITE_OTHER): Payer: Medicare HMO | Admitting: Interventional Cardiology

## 2014-03-16 VITALS — BP 102/70 | HR 66 | Ht 69.0 in | Wt 184.8 lb

## 2014-03-16 DIAGNOSIS — I350 Nonrheumatic aortic (valve) stenosis: Secondary | ICD-10-CM

## 2014-03-16 DIAGNOSIS — E785 Hyperlipidemia, unspecified: Secondary | ICD-10-CM

## 2014-03-16 DIAGNOSIS — I359 Nonrheumatic aortic valve disorder, unspecified: Secondary | ICD-10-CM

## 2014-03-16 NOTE — Progress Notes (Signed)
Patient ID: Samuel Vega, male   DOB: 09/01/1923, 80 y.o.   MRN: [id number]    [address] [place], [place]  [id number] Phone: [phone] Fax:  [phone]  Date:  03/16/2014   ID:  Samuel NorwayWilliam T Vega, DOB 01/25/1924, MRN [id number]  PCP:  JEFFERY,CHELLE, PA-C   ASSESSMENT:  1. Moderate to severe aortic stenosis, asymptomatic 2. Hyperlipidemia 3 per PTSD  PLAN:  1. 2 Doppler echocardiogram 2. Patient is reminded of the cardinal symptoms of severe aortic stenosis including angina, syncope, and dyspnea. 3. Clinical followup in one year   SUBJECTIVE: Samuel Vega is a 80 y.o. male who has no CVA complaints. He has not had syncope, angina, or dyspnea. He denies palpitations.   Wt Readings from Last 3 Encounters:  03/16/14 184 lb 12.8 oz (83.825 kg)  07/04/13 197 lb 6.4 oz (89.54 kg)  12/20/12 200 lb (90.719 kg)     Past Medical History  Diagnosis Date  . Hyperlipidemia   . Macular degeneration   . 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 Anxiety   . Cardiac murmur   . ED (erectile dysfunction)   . PTSD (post-traumatic stress disorder)   . Chronic back pain     Current Outpatient Prescriptions  Medication Sig Dispense Refill  . sildenafil (VIAGRA) 100 MG tablet Take 0.5-1 tablets (50-100 mg total) by mouth daily as needed for erectile dysfunction.  10 tablet  3   No current facility-administered medications for this visit.    Allergies:    Allergies  Allergen Reactions  . Statins     MYALGIAS    Social History:  The patient  reports that he has never smoked. He has never used smokeless tobacco. He reports that he does not drink alcohol or use illicit drugs.   ROS:  Please see the history of present illness.   Very anxious and concerned about PTSD and anxiety. [facility] [place] [facility] [place].   All other systems reviewed and negative.   OBJECTIVE: VS:  BP 102/70  Pulse 66  Ht 5\' 9"  (1.753 m)   Wt 184 lb 12.8 oz (83.825 kg)  BMI 27.28 kg/m2 Well nourished, well developed, in no acute distress, occasion and stated age HEENT: normal Neck: JVD flat. Carotid bruit bilateral transmitted  Cardiac:  normal S1, S2; RRR; 3 of 6 crescendo decrescendo AS murmur Lungs:  clear to auscultation bilaterally, no wheezing, rhonchi or rales Abd: soft, nontender, no hepatomegaly Ext: Edema none. Pulses 2+ Skin: warm and dry Neuro:  CNs 2-12 intact, no focal abnormalities noted  EKG:  Nonspecific T wave abnormality in inferior leads and first-degree AV block       Signed, Darci NeedleHenry W. B. Smith III, MD 03/16/2014 12:50 PM

## 2014-03-16 NOTE — Patient Instructions (Signed)
Your physician recommends that you continue on your current medications as directed. Please refer to the Current Medication list given to you today.  Your physician has requested that you have an echocardiogram. Echocardiography is a painless test that uses sound waves to create images of your heart. It provides your doctor with information about the size and shape of your heart and how well your heart's chambers and valves are working. This procedure takes approximately one hour. There are no restrictions for this procedure.  Your physician wants you to follow-up in: 1 year with Dr. Smith.  You will receive a reminder letter in the mail two months in advance. If you don't receive a letter, please call our office to schedule the follow-up appointment.  

## 2014-03-20 ENCOUNTER — Telehealth: Payer: Self-pay | Admitting: Interventional Cardiology

## 2014-03-20 ENCOUNTER — Telehealth: Payer: Self-pay | Admitting: Physician Assistant

## 2014-03-20 ENCOUNTER — Encounter: Payer: Self-pay | Admitting: Physician Assistant

## 2014-03-20 DIAGNOSIS — R634 Abnormal weight loss: Secondary | ICD-10-CM | POA: Insufficient documentation

## 2014-03-20 NOTE — Telephone Encounter (Signed)
returned pt call.pt would like more information on his condition.adv pt that from a cardiac standpoint his main dx is aortic stenosis adv pt that upcoming echo is sch on 8/18 to reevaluate pt vavle function.pt sts that in the last 2-3 days he has had 2-3 episodes of dizziness with nausea.pt sts it was near syncope.pt denies passing out,denise chest pain or sob.pt sts that dizziness is new to him.adv him I will fwd Dr.Smith an update and call back with his recommendations.pt agreeable and verbalized understanding.

## 2014-03-20 NOTE — Patient Instructions (Signed)
O/v with Dr.Smith on 03/16/14 [facility] Dr.Guha fax# [phone]

## 2014-03-20 NOTE — Telephone Encounter (Signed)
New message    Patient calling    General question regarding upcoming - stent.

## 2014-03-20 NOTE — Telephone Encounter (Signed)
Please call this patient. While reviewing the notes from Dr. Katrinka BlazingSmith (cardiologist), I noted that he's lost some significant weight. This needs to be evaluated, either here or at [facility] [place], within the next 4 weeks.

## 2014-03-21 NOTE — Telephone Encounter (Signed)
Left detailed message on meachine, and please cb

## 2014-03-21 NOTE — Telephone Encounter (Signed)
States he lost weight because he is on the subway diet. This was intentional. His goal 175-176 lbs.

## 2014-03-21 NOTE — Telephone Encounter (Signed)
I do not believe his symptoms he complains of our cardiac related. Continue observation. If he feels sufficiently ill, he should go to the emergency room. I will be able to give him more details after we see results of echocardiogram.

## 2014-03-21 NOTE — Telephone Encounter (Signed)
Patient called. Please return call at [phone]

## 2014-03-21 NOTE — Telephone Encounter (Signed)
Noted.   Since the weight change is intentional, he will follow-up at his next visit.

## 2014-03-22 ENCOUNTER — Telehealth: Payer: Self-pay

## 2014-03-22 NOTE — Telephone Encounter (Signed)
I don't see where anyone called him. Tried to rtn call. Busy x 2

## 2014-03-22 NOTE — Telephone Encounter (Signed)
Patient called and states he had a missed call. Please return call and advise.  CB # [phone]

## 2014-03-27 ENCOUNTER — Ambulatory Visit (INDEPENDENT_AMBULATORY_CARE_PROVIDER_SITE_OTHER): Payer: Medicare HMO | Admitting: Physician Assistant

## 2014-03-27 VITALS — BP 112/78 | HR 62 | Temp 97.4°F | Resp 16 | Ht 67.25 in | Wt 181.7 lb

## 2014-03-27 DIAGNOSIS — H6120 Impacted cerumen, unspecified ear: Secondary | ICD-10-CM

## 2014-03-27 DIAGNOSIS — H6123 Impacted cerumen, bilateral: Secondary | ICD-10-CM

## 2014-03-27 NOTE — Progress Notes (Signed)
   Subjective:    Patient ID: Samuel Vega, male    DOB: 09/30/1923, 80 y.o.   MRN: [id number]   PCP: Jake Goodson, PA-C  Chief Complaint  Patient presents with  . Cerumen Impaction    HPI  Presents for ear wax removal.  He's to see [facility] providers [facility] [place] in the next few days, including the audiologist for hearing aid adjustment.  Yesterday morning when he got out of the shower, he couldn't hear.  He thinks there's lots of wax on the LEFT.  Review of Systems As above.    Objective:   Physical Exam  BP 112/78  Pulse 62  Temp(Src) 97.4 F (36.3 C) (Oral)  Resp 16  Ht 5' 7.25" (1.708 m)  Wt 181 lb 11.2 oz (82.419 kg)  BMI 28.25 kg/m2  SpO2 99%  WDWNWM, A&O x 3.  Both ear canals are noted with cerumen impaction. Resolved with irrigation.      Assessment & Plan:  1. Cerumen impaction, bilateral Resolved with irrigation.   Fernande Brashelle S. Vickye Astorino, PA-C Physician Assistant-Certified [facility]

## 2014-03-28 ENCOUNTER — Other Ambulatory Visit (HOSPITAL_COMMUNITY): Payer: Medicare HMO

## 2014-04-07 ENCOUNTER — Other Ambulatory Visit (HOSPITAL_COMMUNITY): Payer: Medicare HMO

## 2014-04-12 ENCOUNTER — Ambulatory Visit (HOSPITAL_COMMUNITY): Payer: Medicare HMO | Attending: Interventional Cardiology | Admitting: Radiology

## 2014-04-12 DIAGNOSIS — E785 Hyperlipidemia, unspecified: Secondary | ICD-10-CM | POA: Diagnosis not present

## 2014-04-12 DIAGNOSIS — I519 Heart disease, unspecified: Secondary | ICD-10-CM | POA: Insufficient documentation

## 2014-04-12 DIAGNOSIS — I359 Nonrheumatic aortic valve disorder, unspecified: Secondary | ICD-10-CM | POA: Insufficient documentation

## 2014-04-12 DIAGNOSIS — I350 Nonrheumatic aortic (valve) stenosis: Secondary | ICD-10-CM

## 2014-04-12 NOTE — Progress Notes (Signed)
Echocardiogram performed.  

## 2014-04-13 NOTE — Telephone Encounter (Signed)
pt aware of echo results.The patient's echocardiogram demonstrates severe aortic stenosis. This is a progression compared to the prior study. Reiterated the symptoms related to aortic stenosis: Syncope, dyspnea, and chest tightness. He should notify us if any complaints.pt sts that he is doing well.pt interested in possibly proceeding with TAVR and would  like to Dr.Smith about risk and benefits. Adv pt reminder will be mailed for 6 mo f/u, pt to call the office if symptoms develop,message fwd to Dr.Smith to call pt. Pt agreeable and verbalized understanding.

## 2014-04-13 NOTE — Telephone Encounter (Signed)
Message copied by Jarvis Newcomer on Thu Apr 13, 2014 12:49 PM ------      Message from: Verdis Prime      Created: Thu Apr 13, 2014 10:09 AM       The patient's echocardiogram demonstrates severe aortic stenosis. This is a progression compared to the prior study. Reiterated the symptoms related to aortic stenosis: Syncope, dyspnea, and chest tightness. He should notify us if any complaints. Change his one-year appointment to a 6 month followup appointment. ------

## 2014-07-25 ENCOUNTER — Telehealth: Payer: Self-pay | Admitting: Family Medicine

## 2014-07-25 NOTE — Telephone Encounter (Signed)
Patient had the flu shot [facility] [place] on 04/25/14

## 2014-10-31 ENCOUNTER — Ambulatory Visit (INDEPENDENT_AMBULATORY_CARE_PROVIDER_SITE_OTHER): Payer: PPO | Admitting: Physician Assistant

## 2014-10-31 VITALS — BP 124/68 | HR 67 | Temp 97.8°F | Resp 18 | Ht 68.0 in | Wt 183.0 lb

## 2014-10-31 DIAGNOSIS — M549 Dorsalgia, unspecified: Secondary | ICD-10-CM | POA: Diagnosis not present

## 2014-10-31 DIAGNOSIS — I350 Nonrheumatic aortic (valve) stenosis: Secondary | ICD-10-CM

## 2014-10-31 DIAGNOSIS — H3530 Unspecified macular degeneration: Secondary | ICD-10-CM

## 2014-10-31 DIAGNOSIS — F4310 Post-traumatic stress disorder, unspecified: Secondary | ICD-10-CM

## 2014-10-31 DIAGNOSIS — G8929 Other chronic pain: Secondary | ICD-10-CM

## 2014-10-31 NOTE — Progress Notes (Signed)
Patient ID: Samuel Vega, male    DOB: 1924/07/29, 81 y.o.   MRN: [id number]  PCP: Olene Floss  Subjective:   Chief Complaint  Patient presents with  . Follow-up    forms for dmv, handicap forms     HPI  Needs forms completed.  He has chronic low back pain which prevents him from walking distances without stopping to rest. This is especially a problem in large parking lots, and he would like a new handicap placard.   Due to macular degeneration, and other chronic medical problems, he also needs the medical form completed for the [facility].  Review of Systems Review of Systems  Constitutional: Negative.   HENT: Negative.   Eyes: Negative.   Respiratory: Negative.   Cardiovascular: Negative.   Gastrointestinal: Negative.   Endocrine: Negative.   Genitourinary: Negative.        Erectile dysfunction  Musculoskeletal: Positive for back pain and gait problem.  Skin: Negative.   Neurological: Negative for dizziness, speech difficulty, weakness, numbness and headaches.  Psychiatric/Behavioral: Negative for hallucinations, confusion, self-injury and dysphoric mood. The patient is nervous/anxious.        Patient Active Problem List   Diagnosis Date Noted  . Loss of weight 03/20/2014  . Chronic back pain   . Hyperlipidemia 07/29/2012  . Hearing loss 07/29/2012  . Macular degeneration   . Tinnitus   . ED (erectile dysfunction)   . PTSD (post-traumatic stress disorder)   . Aortic stenosis 09/11/2009    Allergies  Allergen Reactions  . Statins     MYALGIAS    Prior to Admission medications   Medication Sig Start Date End Date Taking? Authorizing Provider  ALPRAZolam (XANAX) 0.25 MG tablet Take 0.25 mg by mouth 2 (two) times daily as needed for anxiety.   Yes Historical Provider, MD  sildenafil (VIAGRA) 100 MG tablet Take 0.5-1 tablets (50-100 mg total) by mouth daily as needed for erectile dysfunction. 07/04/13  Yes Fernande Bras, PA-C     Past  Medical, Surgical Family and Social History reviewed and updated.        Objective:  Physical Exam  Physical Exam  Constitutional: He is oriented to person, place, and time. Vital signs are normal. He appears well-developed and well-nourished. He is active and cooperative. No distress.  BP 124/68 mmHg  Pulse 67  Temp(Src) 97.8 F (36.6 C) (Oral)  Resp 18  Ht  (1.727 m)  Wt 183 lb (83.008 kg)  BMI 27.83 kg/m2  SpO2 97%  HENT:  Head: Normocephalic and atraumatic.  Right Ear: Hearing normal.  Left Ear: Hearing normal.  Eyes: Conjunctivae are normal. No scleral icterus.  Neck: Normal range of motion. Neck supple. No thyromegaly present.  Cardiovascular: Normal rate, regular rhythm and normal heart sounds.   Pulses:      Radial pulses are 2+ on the right side, and 2+ on the left side.  Pulmonary/Chest: Effort normal and breath sounds normal.  Lymphadenopathy:       Head (right side): No tonsillar, no preauricular, no posterior auricular and no occipital adenopathy present.       Head (left side): No tonsillar, no preauricular, no posterior auricular and no occipital adenopathy present.    He has no cervical adenopathy.       Right: No supraclavicular adenopathy present.       Left: No supraclavicular adenopathy present.  Neurological: He is alert and oriented to person, place, and time. No sensory deficit.  Skin: Skin  is warm, dry and intact. No rash noted. No cyanosis or erythema. Nails show no clubbing.  Psychiatric: He has a normal mood and affect. His speech is normal and behavior is normal. Thought content is not paranoid. Cognition and memory are not impaired. He expresses no homicidal and no suicidal ideation.           Assessment & Plan:  1. Chronic back pain Stable.  2. PTSD (post-traumatic stress disorder) Stable.  3. Macular degeneration Stable.  4. Aortic stenosis Stable.  [facility] and with [facility] specialists. DMV eye exam  is already completed by his ophthalmologist. Forms completed and copied for scanning.   Fernande Brashelle S. Harel Repetto, PA-C Physician Assistant-Certified [facility]

## 2015-12-28 ENCOUNTER — Ambulatory Visit (INDEPENDENT_AMBULATORY_CARE_PROVIDER_SITE_OTHER): Payer: PPO | Admitting: Physician Assistant

## 2015-12-28 VITALS — BP 122/72 | HR 72 | Temp 97.7°F | Resp 17 | Ht 68.5 in | Wt 187.0 lb

## 2015-12-28 DIAGNOSIS — Z029 Encounter for administrative examinations, unspecified: Secondary | ICD-10-CM

## 2015-12-28 DIAGNOSIS — H6122 Impacted cerumen, left ear: Secondary | ICD-10-CM

## 2015-12-28 DIAGNOSIS — H3530 Unspecified macular degeneration: Secondary | ICD-10-CM

## 2015-12-28 DIAGNOSIS — F4310 Post-traumatic stress disorder, unspecified: Secondary | ICD-10-CM

## 2015-12-28 DIAGNOSIS — I350 Nonrheumatic aortic (valve) stenosis: Secondary | ICD-10-CM

## 2015-12-28 NOTE — Progress Notes (Signed)
Patient ID: Samuel Vega, male    DOB: 08/12/1923, 82 y.o.   MRN: [id number]  PCP: Emelynn Rance, PA-C  Chief Complaint  Patient presents with  . Annual Exam    DMV     Subjective:   HPI: [facility]. Primary concerns are his diagnoses of macular degeneration, PTSD and aortic stenosis. He receives most of his care at the [facility], and I last saw him in 10/2014. In the interim, he reports doing really well, and he has no new physical complaints.  He is dealing with a property dispute which causes "headache."  No decrease in his activity, though he's looking for a live-in housekeeper who could also drive and cook for him. He's had no car crashes, has not fallen, and continues to be active, but he's lonely and would appreciate the help.  Last visit with cardiology outside of the [facility] was in 03/2014. He remains asymptomatic from a cardiac standpoint, denying CP, SOB, dizziness, increased fatigue, reduced endurance.  He is scheduled to see his eye specialist next week.  Mood remains stable. He denies panic attacks, depression. A friend died recently, and he feels guilty that he did not do enough to keep the family from withdrawing ventilator support (though he doesn't know why his friend required ventilation). He feels very lonely and states, "I don't have any friends." He does have family locally, but relates that they are too busy.  He believes that he is current on all his health maintenance items through the [facility] and therefore declines vaccines here today.    Patient Active Problem List   Diagnosis Date Noted  . Loss of weight 03/20/2014  . Chronic back pain   . Hyperlipidemia 07/29/2012  . Hearing loss 07/29/2012  . Macular degeneration   . Tinnitus   . ED (erectile dysfunction)   . PTSD (post-traumatic stress disorder)   . Aortic stenosis 09/11/2009    Past Medical History  Diagnosis Date  . Hyperlipidemia   . Macular degeneration   .  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 Anxiety   . Cardiac murmur   . ED (erectile dysfunction)   . PTSD (post-traumatic stress disorder)   . Chronic back pain      Prior to Admission medications   Medication Sig Start Date End Date Taking? Authorizing Provider  ALPRAZolam (XANAX) 0.25 MG tablet Take 0.25 mg by mouth 2 (two) times daily as needed for anxiety.   Yes Historical Provider, MD  sildenafil (VIAGRA) 100 MG tablet Take 0.5-1 tablets (50-100 mg total) by mouth daily as needed for erectile dysfunction. 07/04/13  Yes Porfirio Oarhelle Essense Bousquet, PA-C    Allergies  Allergen Reactions  . Statins     MYALGIAS    Past Surgical History  Procedure Laterality Date  . Eye surgery    . Elbow surgery      No family history on file.  Social History   Social History  . Marital Status: Divorced    Spouse Name: n/a  . Number of Children: 2  . Years of Education: N/A   Occupational History  . retired    Social History Main Topics  . Smoking status: Never Smoker   . Smokeless tobacco: Never Used  . Alcohol Use: No     Comment: none since 1980  . Drug Use: No  . Sexual Activity:    Partners: Female   Other Topics  Concern  . None   Social History Narrative   Divorced in [place]. 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  His grandson is a Optometrist.       Review of Systems  Constitutional: Negative.   HENT: Positive for hearing loss (baseline). Negative for congestion, dental problem, drooling, ear discharge, ear pain, facial swelling, mouth sores, nosebleeds, postnasal drip, rhinorrhea, sinus pressure, sneezing, sore throat, tinnitus, trouble swallowing and voice change.   Eyes: Positive for visual disturbance (appointment with eye specialist is 01/09/2016). Negative for photophobia, pain, discharge, redness and itching.  Respiratory: Negative.   Cardiovascular: Negative.   Gastrointestinal: Negative.   Endocrine: Negative.   Genitourinary: Negative.     Musculoskeletal: Negative.   Skin: Negative.   Allergic/Immunologic: Negative.   Neurological: Negative.   Hematological: Negative.   Psychiatric/Behavioral: Positive for dysphoric mood. Negative for suicidal ideas, hallucinations, behavioral problems, confusion, sleep disturbance, self-injury, decreased concentration and agitation. The patient is nervous/anxious. The patient is not hyperactive.         Objective:  Physical Exam  Constitutional: He is oriented to person, place, and time. He appears well-developed and well-nourished. He is active and cooperative. No distress.  BP 122/72 mmHg  Pulse 72  Temp(Src) 97.7 F (36.5 C) (Oral)  Resp 17  Ht 5' 8.5" (1.74 m)  Wt 187 lb (84.823 kg)  BMI 28.02 kg/m2  SpO2 97%  HENT:  Head: Normocephalic and atraumatic.  Right Ear: Hearing, tympanic membrane, external ear and ear canal normal.  Left Ear: Hearing, tympanic membrane, external ear and ear canal normal.  Nose: Nose normal.  Mouth/Throat: Oropharynx is clear and moist and mucous membranes are normal. No oral lesions. Normal dentition. No uvula swelling. No oropharyngeal exudate.  Initially he had moderate cerumen in the LEFT ear, which was cleared with irrigation.  Eyes: Conjunctivae are normal. No scleral icterus.  Neck: Normal range of motion. Neck supple. No thyromegaly present.  Cardiovascular: Normal rate and regular rhythm.  Exam reveals no gallop and no friction rub.   Murmur heard. Pulses:      Radial pulses are 2+ on the right side, and 2+ on the left side.  Pulmonary/Chest: Effort normal and breath sounds normal.  Abdominal: Soft. Bowel sounds are normal.  Musculoskeletal:       Right shoulder: Normal.       Left shoulder: Normal.       Right wrist: Normal.       Left wrist: Normal.       Right hip: Normal.       Left hip: Normal.       Right knee: Normal.       Left knee: Normal.       Cervical back: Normal.       Thoracic back: Normal.       Lumbar back:  Normal.  Lymphadenopathy:       Head (right side): No tonsillar, no preauricular, no posterior auricular and no occipital adenopathy present.       Head (left side): No tonsillar, no preauricular, no posterior auricular and no occipital adenopathy present.    He has no cervical adenopathy.       Right: No supraclavicular adenopathy present.       Left: No supraclavicular adenopathy present.  Neurological: He is alert and oriented to person, place, and time. He has normal strength. No cranial nerve deficit or sensory deficit. He displays a negative Romberg sign.  Reflex Scores:      Bicep reflexes are 2+ on the  right side and 2+ on the left side.      Patellar reflexes are 2+ on the right side and 2+ on the left side.      Achilles reflexes are 2+ on the right side and 2+ on the left side. Skin: Skin is warm, dry and intact. No rash noted. No cyanosis or erythema. Nails show no clubbing.  Psychiatric: His speech is normal and behavior is normal. Judgment and thought content normal. His mood appears not anxious. His affect is not angry, not blunt, not labile and not inappropriate. Cognition and memory are normal. He exhibits a depressed mood.           Assessment & Plan:  1. Encounter for administrative examinations DMV forms completed and will mail. The single page for ophthalmology he retains for that upcoming evaluation elsewhere.  2. Aortic stenosis Asymptomatic. Continue follow-up with cardiology.  3. Macular degeneration Continue follow-up with ophthalmology.  4. PTSD (post-traumatic stress disorder) Stable.  5. Cerumen impaction, left Resolved.  Discussed living will and health care POA. Encouraged him to put his wishes in wriiting and to share with his family. He indicates that he would not want to remain on artificial ventilation if there was no reasonable hope that he could come off of it, but presently would want initial emergency treatment including CPR, mechanical  ventilation and defibrillation.   Fernande Bras, PA-C Physician Assistant-Certified [facility]

## 2015-12-28 NOTE — Patient Instructions (Addendum)
I will send in the form that I have completed. The one remaining page must be completed by your eye specialist.  Please contact the [facility]. They may help you find a person interested in the arrangement you have in mind for helping you with cooking, cleaning, transportation and companionship in exchange for room and board and some pay.    IF you received an x-ray today, you will receive an invoice from [facility]. Please contact [facility] at [phone] with questions or concerns regarding your invoice.   IF you received labwork today, you will receive an invoice from [facility]. Please contact [facility] at [phone] with questions or concerns regarding your invoice.   Our billing staff will not be able to assist you with questions regarding bills from these companies.  You will be contacted with the lab results as soon as they are available. The fastest way to get your results is to activate your My Chart account. Instructions are located on the last page of this paperwork. If you have not heard from us regarding the results in 2 weeks, please contact this office.

## 2016-01-09 DIAGNOSIS — H353134 Nonexudative age-related macular degeneration, bilateral, advanced atrophic with subfoveal involvement: Secondary | ICD-10-CM | POA: Diagnosis not present

## 2016-11-14 DIAGNOSIS — H353232 Exudative age-related macular degeneration, bilateral, with inactive choroidal neovascularization: Secondary | ICD-10-CM | POA: Diagnosis not present

## 2016-11-25 DIAGNOSIS — H04123 Dry eye syndrome of bilateral lacrimal glands: Secondary | ICD-10-CM | POA: Diagnosis not present

## 2016-12-08 ENCOUNTER — Other Ambulatory Visit: Payer: Self-pay | Admitting: Physician Assistant

## 2016-12-08 NOTE — Telephone Encounter (Signed)
PATIENT WOULD LIKE TO ASK CHELLE IF SHE WILL BE SO KIND TO REFILL HIS ALPRAZOLAM 0.25 MG. [facility] A DIFFERENT BRAND THAT DOES NOT AGREE WITH HIM. THE MANUFACTURER THAT MAKES THE ALPRAZOLAM HE LIKES IS [employer]. BEST PHONE [phone] (HOME) PHARMACY CHOICE IS [facility] IN [facility] [place]

## 2016-12-09 MED ORDER — ALPRAZOLAM 0.25 MG PO TABS: 0.2500 mg | ORAL_TABLET | Freq: Two times a day (BID) | ORAL | 0 refills | Status: DC | PRN

## 2016-12-09 NOTE — Telephone Encounter (Signed)
Rx printed at 104. Will bring to 102 after clinic. Please advise patient that I need to see him for additional fills. Last seen 12/2015.  Meds ordered this encounter  Medications  . ALPRAZolam (XANAX) 0.25 MG tablet    Sig: Take 1 tablet (0.25 mg total) by mouth 2 (two) times daily as needed for anxiety.    Dispense:  30 tablet    Refill:  0    Patient requests [employer] product    Order Specific Question:   Supervising Provider    Answer:   Clelia Croft, EVA N [[id number]]

## 2016-12-09 NOTE — Telephone Encounter (Signed)
Last seen 1 year ago. Advice?

## 2016-12-10 NOTE — Telephone Encounter (Signed)
Pt made aware script Alprazolam faxed to [facility] pharmacy Advised will need to return to the office for further refills.

## 2016-12-16 ENCOUNTER — Ambulatory Visit (INDEPENDENT_AMBULATORY_CARE_PROVIDER_SITE_OTHER): Payer: PPO | Admitting: Physician Assistant

## 2016-12-16 ENCOUNTER — Encounter: Payer: Self-pay | Admitting: Physician Assistant

## 2016-12-16 VITALS — BP 120/70 | HR 64 | Temp 98.2°F | Resp 16 | Ht 68.0 in | Wt 187.6 lb

## 2016-12-16 DIAGNOSIS — F4310 Post-traumatic stress disorder, unspecified: Secondary | ICD-10-CM | POA: Diagnosis not present

## 2016-12-16 MED ORDER — ALPRAZOLAM 0.25 MG PO TABS: 0.2500 mg | ORAL_TABLET | Freq: Three times a day (TID) | ORAL | 0 refills | Status: DC | PRN

## 2016-12-16 MED ORDER — CITALOPRAM HYDROBROMIDE 20 MG PO TABS: 20.0000 mg | ORAL_TABLET | Freq: Every day | ORAL | 3 refills | Status: DC

## 2016-12-16 NOTE — Progress Notes (Signed)
Patient ID: Samuel Vega, male    DOB: 04/19/1924, 83 y.o.   MRN: [id number]  PCP: Porfirio OarJeffery, Elisabel Hanover, PA-C  Chief Complaint  Patient presents with  . med check    Subjective:   Presents for medication refill.  I last saw him 12 months ago for a DMV evaluation. Most of his care is performed at the [facility]. His health maintenance is all performed there, per patient report. He believes that the alprazolam that he was receiving from them was somehow ineffective, possibly not the prescribed drug. At his request on 12/08/16, I sent a prescription for the particular brand that he has used successfully in the past, and it is working much better. Takes 0.25 mg TID-QID. Sleeps "So damn good." Wakes up feeling well rested.  Previously prescribed: citalopram, paroxetine At his visit with me 09/2012 he specifically requested citalopram 20 mg. To get it from the [facility], he was going to have to see a psychiatrist there, and that seemed onerous to him. At follow-up 11/2012 he related that it was working really well, that he felt "like a different person." He was no longer needing the alprazolam and had stopped it entirely. He had also discovered that his brother was taking citalopram for PTSD. He was still taking it 12/20/2012.  [facility] and reported having stopped all of his medications. Statin had caused muscle pain. His brother had experienced some adverse effects from the citalopram, though he didn't know what the adverse effects were, so the patient stopped it as a precautionary measure. He wanted to resume PTSD treatment, but did not want alprazolam nor citalopram. Paroxetine was prescribed.   He was seen for one visit in 2015 for ear wax removal.  10/2014 he presented for evaluation of back pain, at which time he was no longer taking paroxetine, and was back on alprazolam. [facility].  He doesn't recall having been prescribed citalopram nor  paroxetine by me. There have been concerns previously regarding his memory, which he vehemently denies.  Reports that he was prescribed something at the [facility] in the 1990's "that just about killed me." Later a psychiatrist ordered "it" again, but he realized it was the same thing and never took it. Caused him to feel weak. Previous back injuries. "Four doctors lied," related to the cause of his injury. He fought a disability claim and won full disability.  He has had eye surgery since our last visit. Sees much more clearly now. Is very pleased with the results.  Review of Systems No chest pain, SOB, HA, dizziness, vision change, N/V, diarrhea, constipation, dysuria, urinary urgency or frequency, myalgias, arthralgias or rash.   Patient Active Problem List   Diagnosis Date Noted  . Loss of weight 03/20/2014  . Chronic back pain   . Hyperlipidemia 07/29/2012  . Hearing loss 07/29/2012  . Macular degeneration   . Tinnitus   . ED (erectile dysfunction)   . PTSD (post-traumatic stress disorder)   . Aortic stenosis 09/11/2009     Prior to Admission medications   Medication Sig Start Date End Date Taking? Authorizing Provider  PRESCRIPTION MEDICATION Vitamin for eyes taking   Yes [provider]  sildenafil (VIAGRA) 100 MG tablet Take 0.5-1 tablets (50-100 mg total) by mouth daily as needed for erectile dysfunction. 07/04/13  Yes Aaria Happ, PA-C  ALPRAZolam (XANAX) 0.25 MG tablet Take 1 tablet (0.25 mg total) by mouth 2 (two) times daily as needed  for anxiety. Patient not taking: Reported on 12/16/2016 12/09/16   Porfirio Oar, PA-C     Allergies  Allergen Reactions  . Statins     MYALGIAS       Objective:  Physical Exam  Constitutional: He is oriented to person, place, and time. He appears well-developed and well-nourished. He is active and cooperative. No distress.  BP 120/70   Pulse 64   Temp 98.2 F (36.8 C) (Oral)   Resp 16   Ht 5\' 8"  (1.727 m)   Wt 187 lb  9.6 oz (85.1 kg)   SpO2 97%   BMI 28.52 kg/m   HENT:  Head: Normocephalic and atraumatic.  Right Ear: Hearing normal.  Left Ear: Hearing normal.  Eyes: Conjunctivae are normal. No scleral icterus.  Neck: Normal range of motion. Neck supple. No thyromegaly present.  Cardiovascular: Normal rate and regular rhythm.   Murmur heard.  Systolic murmur is present  Pulses:      Radial pulses are 2+ on the right side, and 2+ on the left side.  Pulmonary/Chest: Effort normal and breath sounds normal.  Lymphadenopathy:       Head (right side): No tonsillar, no preauricular, no posterior auricular and no occipital adenopathy present.       Head (left side): No tonsillar, no preauricular, no posterior auricular and no occipital adenopathy present.    He has no cervical adenopathy.       Right: No supraclavicular adenopathy present.       Left: No supraclavicular adenopathy present.  Neurological: He is alert and oriented to person, place, and time. No sensory deficit.  Skin: Skin is warm, dry and intact. No rash noted. No cyanosis or erythema. Nails show no clubbing.  Psychiatric: He has a normal mood and affect. His speech is normal and behavior is normal. Thought content normal. Impaired: possible impairment. He does not express impulsivity or inappropriate judgment.           Assessment & Plan:   Problem List Items Addressed This Visit    PTSD (post-traumatic stress disorder) - Primary    Resume citalopram. Counseled that benzodiazepines are not appropriate long-term, nor as monotherapy for anxiety or PTSD symptoms. Reminded him that the citalopram worked well for him previously, and he was able to stop the alprazolam entirely.      Relevant Medications   citalopram (CELEXA) 20 MG tablet   ALPRAZolam (XANAX) 0.25 MG tablet       Return in about 3 weeks (around 01/06/2017) for re-evalaution of mood on new medication.   Fernande Bras, PA-C [facility]

## 2016-12-16 NOTE — Patient Instructions (Addendum)
Please start the citalopram (Celexa). Take one dose each day. Take the alprazolam at night, and more often IF YOU NEED IT. Hopefully you won't need the alprazolam as often once you start the citalopram.  Next time you go to the [facility], please get copies of the test results, so that I can put them in your chart here.    IF you received an x-ray today, you will receive an invoice from [facility]. Please contact [facility] at [phone] with questions or concerns regarding your invoice.   IF you received labwork today, you will receive an invoice from [facility]. Please contact [facility] at [phone] with questions or concerns regarding your invoice.   Our billing staff will not be able to assist you with questions regarding bills from these companies.  You will be contacted with the lab results as soon as they are available. The fastest way to get your results is to activate your My Chart account. Instructions are located on the last page of this paperwork. If you have not heard from us regarding the results in 2 weeks, please contact this office.

## 2016-12-19 ENCOUNTER — Telehealth: Payer: Self-pay | Admitting: Physician Assistant

## 2016-12-19 NOTE — Assessment & Plan Note (Signed)
Resume citalopram. Counseled that benzodiazepines are not appropriate long-term, nor as monotherapy for anxiety or PTSD symptoms. Reminded him that the citalopram worked well for him previously, and he was able to stop the alprazolam entirely.

## 2016-12-19 NOTE — Telephone Encounter (Signed)
Patient called to let Chelle know that the new medication she put him on did not work for him. Stated he took a half of dose two days ago and is still having issues.  He did not tell me what medication or what issues/reactions he was having. He would like Chelle to give him a call at [phone]

## 2016-12-23 NOTE — Telephone Encounter (Signed)
He's referring to the citalopram.  It's not meant to work immediately, like the alprazolam. He should stay on it for at least 4 weeks before determining that it isn't working.

## 2016-12-23 NOTE — Telephone Encounter (Signed)
Attempted to reach pt;number listed busy signal. Will try again

## 2017-01-06 ENCOUNTER — Ambulatory Visit: Payer: PPO | Admitting: Physician Assistant

## 2017-01-09 NOTE — Telephone Encounter (Signed)
Pt states it has been 1-2 wks, he does not remember what symptoms he had. He stated that " his body is calling for more, feels like going through cravings for more drugs. " Gripes and stomach hurts". Per pt he had taken medication one time. Pt has an appt on 01/17/17

## 2017-01-17 ENCOUNTER — Ambulatory Visit (INDEPENDENT_AMBULATORY_CARE_PROVIDER_SITE_OTHER): Payer: PPO | Admitting: Physician Assistant

## 2017-01-17 ENCOUNTER — Encounter: Payer: Self-pay | Admitting: Physician Assistant

## 2017-01-17 VITALS — BP 143/77 | HR 71 | Temp 98.3°F | Resp 16 | Ht 67.75 in | Wt 186.4 lb

## 2017-01-17 DIAGNOSIS — F4310 Post-traumatic stress disorder, unspecified: Secondary | ICD-10-CM

## 2017-01-17 MED ORDER — ALPRAZOLAM 0.25 MG PO TABS: 0.2500 mg | ORAL_TABLET | Freq: Three times a day (TID) | ORAL | 0 refills | Status: DC | PRN

## 2017-01-17 NOTE — Patient Instructions (Addendum)
RESTART the Celexa (citalopram) for "maintenance". Take 1/2 tablet one time every day for 1 week. If you still have side effects at 1 week, stop it and let me know. If you tolerate it, KEEP TAKING IT. It takes 4-6 weeks to work it's best. Because it takes a while to work, we use "rescue" medication to help.  CONTINUE the alprazolam AS NEEDED for your nerves ("rescue").    IF you received an x-ray today, you will receive an invoice from [facility]. Please contact [facility] at [phone] with questions or concerns regarding your invoice.   IF you received labwork today, you will receive an invoice from [facility]. Please contact [facility] at [phone] with questions or concerns regarding your invoice.   Our billing staff will not be able to assist you with questions regarding bills from these companies.  You will be contacted with the lab results as soon as they are available. The fastest way to get your results is to activate your My Chart account. Instructions are located on the last page of this paperwork. If you have not heard from us regarding the results in 2 weeks, please contact this office.

## 2017-01-17 NOTE — Progress Notes (Signed)
Patient ID: Samuel Vega, male    DOB: 10/30/1923, 83 y.o.   MRN: [id number]  PCP: Porfirio OarJeffery, Cenia Zaragosa, PA-C  Chief Complaint  Patient presents with  . Post-Traumatic Stress Disorder    Subjective:   Presents for evaluation of PTSD.  He isn't taking the citalopram. Took only one dose after it was re-started at his last visit. He called to say that it hadn't worked. Today states that "It tore the heck out of me. My stomach and intestines drawed up, tightened them up, like I was hooked on drugs. Just like when I got the wrong kind of alprazolam."  Initially states that sometimes needs alprazolam 1 mg at HS to sleep. Then states that he takes it at 9 am and 11 pm, but that it doesn't always work at the 11 pm dose. Overall, he feels like he's resting better.  A [place] veteran is living in his home right now, getting back on his feet. He feels good helping someone else out.  Is looking to [facility] the [facility] to this practice for all his primary care. He relates that it's hard to get appointments and to get through all the red tape required to see specialists.   Review of Systems No CP, SOB, HA, dizziness. No nausea, vomiting or diarrhea.    Patient Active Problem List   Diagnosis Date Noted  . Loss of weight 03/20/2014  . Chronic back pain   . Hyperlipidemia 07/29/2012  . Hearing loss 07/29/2012  . Macular degeneration   . Tinnitus   . ED (erectile dysfunction)   . PTSD (post-traumatic stress disorder)   . Aortic stenosis 09/11/2009     Prior to Admission medications   Medication Sig Start Date End Date Taking? Authorizing Provider  ALPRAZolam (XANAX) 0.25 MG tablet Take 1 tablet (0.25 mg total) by mouth 3 (three) times daily as needed for anxiety. 12/16/16  Yes Isaac Dubie, Avelino Leedshelle, PA-C  PRESCRIPTION MEDICATION Vitamin for eyes taking   Yes [provider]  sildenafil (VIAGRA) 100 MG tablet Take 0.5-1 tablets (50-100 mg total) by mouth daily as needed  for erectile dysfunction. 07/04/13  Yes Janny Crute, PA-C  citalopram (CELEXA) 20 MG tablet Take 1 tablet (20 mg total) by mouth daily. Patient not taking: Reported on 01/17/2017 12/16/16   Porfirio OarJeffery, Genaro Bekker, PA-C     Allergies  Allergen Reactions  . Statins     MYALGIAS       Objective:  Physical Exam  Constitutional: He is oriented to person, place, and time. He appears well-developed and well-nourished. He is active and cooperative. No distress.  BP (!) 143/77   Pulse 71   Temp 98.3 F (36.8 C) (Oral)   Resp 16   Ht 5' 7.75" (1.721 m)   Wt 186 lb 6 oz (84.5 kg)   SpO2 98%   BMI 28.55 kg/m   HENT:  Head: Normocephalic and atraumatic.  Right Ear: Hearing normal.  Left Ear: Hearing normal.  Eyes: Conjunctivae are normal. No scleral icterus.  Neck: Normal range of motion. Neck supple. No thyromegaly present.  Cardiovascular: Normal rate and regular rhythm.   Murmur heard.  Systolic murmur is present with a grade of 2/6  Pulses:      Radial pulses are 2+ on the right side, and 2+ on the left side.  Pulmonary/Chest: Effort normal and breath sounds normal.  Lymphadenopathy:       Head (right side): No tonsillar, no preauricular, no posterior auricular and  no occipital adenopathy present.       Head (left side): No tonsillar, no preauricular, no posterior auricular and no occipital adenopathy present.    He has no cervical adenopathy.       Right: No supraclavicular adenopathy present.       Left: No supraclavicular adenopathy present.  Neurological: He is alert and oriented to person, place, and time. No sensory deficit.  Skin: Skin is warm, dry and intact. No rash noted. No cyanosis or erythema. Nails show no clubbing.  Psychiatric: He has a normal mood and affect. His speech is normal and behavior is normal.       Assessment & Plan:   Problem List Items Addressed This Visit    PTSD (post-traumatic stress disorder) - Primary    Lengthy conversation about treatment of  PTSD, anxiety and depression. I recommend maintenance therapy, with PRN rescue using benzodiazepine. I am concerned that he has some memory loss or cognitive impairment, given that he doesn't recall having been on the citalopram previously, or any of the other attempts to treat his symptoms. I asked, and he agreed to retry the citalopram. He will start by taking 1/2 tablet (10 mg), daily x 1 week. If he tolerates it, he will increase to the whole tablet (20 mg) daily. He may still use the alprazolam PRN.      Relevant Medications   ALPRAZolam (XANAX) 0.25 MG tablet       Return in about 6 weeks (around 02/28/2017).   Fernande Bras, PA-C Primary Care at [facility]

## 2017-01-19 ENCOUNTER — Telehealth: Payer: Self-pay | Admitting: Physician Assistant

## 2017-01-19 NOTE — Telephone Encounter (Signed)
Pt is needing to talk with Samuel Vega about what the new medication from Saturday is for Citalopram   Best number [phone]

## 2017-01-20 NOTE — Telephone Encounter (Signed)
Called , spoke with the patient, explained about Celexa what is for, and patient understood. Advised to take 1/2 tab. QD for 1 week, and if has any side effects to call us.

## 2017-02-14 NOTE — Assessment & Plan Note (Signed)
Lengthy conversation about treatment of PTSD, anxiety and depression. I recommend maintenance therapy, with PRN rescue using benzodiazepine. I am concerned that he has some memory loss or cognitive impairment, given that he doesn't recall having been on the citalopram previously, or any of the other attempts to treat his symptoms. I asked, and he agreed to retry the citalopram. He will start by taking 1/2 tablet (10 mg), daily x 1 week. If he tolerates it, he will increase to the whole tablet (20 mg) daily. He may still use the alprazolam PRN.

## 2017-02-26 ENCOUNTER — Telehealth: Payer: Self-pay

## 2017-02-26 ENCOUNTER — Telehealth: Payer: Self-pay | Admitting: Physician Assistant

## 2017-02-26 DIAGNOSIS — F4310 Post-traumatic stress disorder, unspecified: Secondary | ICD-10-CM

## 2017-02-26 MED ORDER — ALPRAZOLAM 0.25 MG PO TABS: 0.2500 mg | ORAL_TABLET | Freq: Three times a day (TID) | ORAL | 0 refills | Status: DC | PRN

## 2017-02-26 NOTE — Telephone Encounter (Signed)
Please advise. Thanks.  

## 2017-02-26 NOTE — Telephone Encounter (Signed)
Xanax faxed to pharmacy and received a success notice. 

## 2017-02-26 NOTE — Telephone Encounter (Signed)
Spoke with pt and spoke with Chelle.

## 2017-02-26 NOTE — Telephone Encounter (Signed)
Meds ordered this encounter  Medications  . ALPRAZolam (XANAX) 0.25 MG tablet    Sig: Take 1 tablet (0.25 mg total) by mouth 3 (three) times daily as needed for anxiety.    Dispense:  90 tablet    Refill:  0    Patient requests GREENSTONE product    Order Specific Question:   Supervising Provider    Answer:   Clelia CroftSHAW, EVA N [[id number]]    Please ask patient if he is taking the citalopram (Celexa). If not, WHY? Did he try again, like we discussed?

## 2017-02-26 NOTE — Telephone Encounter (Signed)
[facility] and [facility] the [facility] at [facility].

## 2017-02-26 NOTE — Telephone Encounter (Signed)
PATIENT WOULD LIKE CHELLE TO KNOW THAT HE NEEDS A REFILL ON HIS ALPRAZOLAM (XANAX) 0.25 MG. BEST PHONE [phone] (HOME) PHARMACY CHOICE IS [facility] [place]. MBC

## 2017-02-27 NOTE — Telephone Encounter (Signed)
Pt has been informed.

## 2017-03-03 ENCOUNTER — Ambulatory Visit: Payer: PPO | Admitting: Physician Assistant

## 2017-03-05 DIAGNOSIS — H353232 Exudative age-related macular degeneration, bilateral, with inactive choroidal neovascularization: Secondary | ICD-10-CM | POA: Diagnosis not present

## 2017-03-05 DIAGNOSIS — H527 Unspecified disorder of refraction: Secondary | ICD-10-CM | POA: Diagnosis not present

## 2017-03-05 DIAGNOSIS — H04123 Dry eye syndrome of bilateral lacrimal glands: Secondary | ICD-10-CM | POA: Diagnosis not present

## 2017-03-05 DIAGNOSIS — Z961 Presence of intraocular lens: Secondary | ICD-10-CM | POA: Diagnosis not present

## 2017-03-27 ENCOUNTER — Telehealth: Payer: Self-pay | Admitting: Physician Assistant

## 2017-03-27 DIAGNOSIS — F4310 Post-traumatic stress disorder, unspecified: Secondary | ICD-10-CM

## 2017-03-27 NOTE — Telephone Encounter (Signed)
Patient needs his Alpazolam RX sent in to the [facility]. He normally sees Chelle  His call back number [phone]

## 2017-03-27 NOTE — Telephone Encounter (Signed)
Per visit note 01/2017, patient was to follow up around 02/28/2017. Patient cancelled appointment on 03/03/17 but has an upcoming appointment scheduled for 04/01/17. Please advise, thank you/ S.Cheronda Erck,CMA

## 2017-03-30 ENCOUNTER — Other Ambulatory Visit: Payer: Self-pay | Admitting: Physician Assistant

## 2017-03-30 DIAGNOSIS — F4310 Post-traumatic stress disorder, unspecified: Secondary | ICD-10-CM

## 2017-03-30 MED ORDER — ALPRAZOLAM 0.25 MG PO TABS: 0.2500 mg | ORAL_TABLET | Freq: Three times a day (TID) | ORAL | 0 refills | Status: DC | PRN

## 2017-03-30 NOTE — Telephone Encounter (Signed)
Rx printed at [facility].  Meds ordered this encounter  Medications  . ALPRAZolam (XANAX) 0.25 MG tablet    Sig: Take 1 tablet (0.25 mg total) by mouth 3 (three) times daily as needed for anxiety.    Dispense:  90 tablet    Refill:  0    Patient requests GREENSTONE product    Order Specific Question:   Supervising Provider    Answer:   Clelia Croft, EVA N [[id number]]

## 2017-03-31 ENCOUNTER — Telehealth: Payer: Self-pay

## 2017-03-31 NOTE — Telephone Encounter (Signed)
Spoke with Pt and Rx will be at the [facility] building.

## 2017-03-31 NOTE — Telephone Encounter (Signed)
Done - 8/20 

## 2017-04-01 ENCOUNTER — Encounter: Payer: Self-pay | Admitting: Physician Assistant

## 2017-04-01 ENCOUNTER — Ambulatory Visit (INDEPENDENT_AMBULATORY_CARE_PROVIDER_SITE_OTHER): Payer: PPO | Admitting: Physician Assistant

## 2017-04-01 VITALS — BP 138/79 | HR 64 | Temp 97.5°F | Resp 18 | Ht 67.75 in | Wt 187.2 lb

## 2017-04-01 DIAGNOSIS — H6123 Impacted cerumen, bilateral: Secondary | ICD-10-CM

## 2017-04-01 DIAGNOSIS — F4310 Post-traumatic stress disorder, unspecified: Secondary | ICD-10-CM

## 2017-04-01 MED ORDER — PAROXETINE HCL 10 MG PO TABS: 10.0000 mg | ORAL_TABLET | Freq: Every day | ORAL | 3 refills | Status: DC

## 2017-04-01 NOTE — Patient Instructions (Addendum)
Try the paroxetine (Paxil). Take one each morning. Try to stay on it for at least 2 weeks. Any side effects that you experience should resolve in 2 weeks. If not, let me know, and we'll try something else.    IF you received an x-ray today, you will receive an invoice from [facility]. Please contact [facility] at [phone] with questions or concerns regarding your invoice.   IF you received labwork today, you will receive an invoice from [facility]. Please contact [facility] at [phone] with questions or concerns regarding your invoice.   Our billing staff will not be able to assist you with questions regarding bills from these companies.  You will be contacted with the lab results as soon as they are available. The fastest way to get your results is to activate your My Chart account. Instructions are located on the last page of this paperwork. If you have not heard from [place] regarding the results in 2 weeks, please contact this office.

## 2017-04-01 NOTE — Progress Notes (Signed)
Patient ID: Samuel Vega, male    DOB: 1923/12/14, 83 y.o.   MRN: [id number]  PCP: Porfirio Oar, PA-C  Chief Complaint  Patient presents with  . Cerumen Impaction    Pt would to have ears cleaned out.    Subjective:   Presents for evaluation of ear fullness. He believes that there is wax in his ears.  Some reduced hearing.  He also brings up his use of alprazolam for PTSD. He has previously been on SSRI, but stopped it and doesn't remember having taken it. See my previous notes on this issue. Most recently, he's been on citalopram but believes that it "tore my stomach up." He would now like to try something "like that, but one that won't tear me up."    Review of Systems As above.    Patient Active Problem List   Diagnosis Date Noted  . Loss of weight 03/20/2014  . Chronic back pain   . Hyperlipidemia 07/29/2012  . Hearing loss 07/29/2012  . Macular degeneration   . Tinnitus   . ED (erectile dysfunction)   . PTSD (post-traumatic stress disorder)   . Aortic stenosis 09/11/2009     Prior to Admission medications   Medication Sig Start Date End Date Taking? Authorizing Provider  ALPRAZolam (XANAX) 0.25 MG tablet Take 1 tablet (0.25 mg total) by mouth 3 (three) times daily as needed for anxiety. 03/30/17  Yes Valene Villa, Avelino Leeds, PA-C  PRESCRIPTION MEDICATION Vitamin for eyes taking   Yes [provider]  sildenafil (VIAGRA) 100 MG tablet Take 0.5-1 tablets (50-100 mg total) by mouth daily as needed for erectile dysfunction. 07/04/13  Yes Daanish Copes, PA-C  citalopram (CELEXA) 20 MG tablet Take 1 tablet (20 mg total) by mouth daily. Patient not taking: Reported on 04/01/2017 12/16/16   Porfirio Oar, PA-C     Allergies  Allergen Reactions  . Statins     MYALGIAS       Objective:  Physical Exam  Constitutional: He is oriented to person, place, and time. He appears well-developed and well-nourished. He is active and cooperative. No distress.    BP 138/79 (BP Location: Right Arm, Patient Position: Sitting, Cuff Size: Normal)   Pulse 64   Temp (!) 97.5 F (36.4 C) (Oral)   Resp 18   Ht 5' 7.75" (1.721 m)   Wt 187 lb 3.2 oz (84.9 kg)   SpO2 98%   BMI 28.67 kg/m    HENT:  Head: Normocephalic.  Bilateral cerumenosis. Resolved with irrigation and RIGHT sided manual debridement, with resolution of his discomfort. Canal and TM are normal following procedure.  Eyes: Conjunctivae are normal.  Pulmonary/Chest: Effort normal.  Neurological: He is alert and oriented to person, place, and time.  Psychiatric: He has a normal mood and affect. His speech is normal and behavior is normal.           Assessment & Plan:   Problem List Items Addressed This Visit    PTSD (post-traumatic stress disorder) - Primary    Try paroxetine. Advised that he may experience some adverse effects, including nausea, during the first 2 weeks. If he has side effects that persist, he'll let me know and we will try something else.      Relevant Medications   PARoxetine (PAXIL) 10 MG tablet    Other Visit Diagnoses    Bilateral impacted cerumen       Relevant Orders   Ear wax removal  Return in about 6 weeks (around 05/13/2017) for re-evaluation of mood.   Fernande Bras, PA-C Primary Care at [facility]

## 2017-04-02 ENCOUNTER — Other Ambulatory Visit: Payer: Self-pay | Admitting: Physician Assistant

## 2017-04-02 DIAGNOSIS — F4310 Post-traumatic stress disorder, unspecified: Secondary | ICD-10-CM

## 2017-04-02 NOTE — Telephone Encounter (Signed)
SS req for refill of Alprazolam.  Appeared it was sent to [facility].  [facility] req [facility]. Resent to [facility].  Called [facility] and Angie faxed printed & signed rx to [facility].  Deleted [facility] from list.

## 2017-04-04 NOTE — Assessment & Plan Note (Signed)
Try paroxetine. Advised that he may experience some adverse effects, including nausea, during the first 2 weeks. If he has side effects that persist, he'll let me know and we will try something else.

## 2017-04-22 ENCOUNTER — Other Ambulatory Visit: Payer: Self-pay | Admitting: Physician Assistant

## 2017-04-22 ENCOUNTER — Telehealth: Payer: Self-pay | Admitting: Family Medicine

## 2017-04-22 DIAGNOSIS — F4310 Post-traumatic stress disorder, unspecified: Secondary | ICD-10-CM

## 2017-04-22 NOTE — Telephone Encounter (Signed)
Denied refill request for alprazolam. Last ordered on 8/23, for #90, a 30-day supply. This should last him until 9/22, 10 more days.

## 2017-04-23 ENCOUNTER — Other Ambulatory Visit: Payer: Self-pay | Admitting: Physician Assistant

## 2017-04-23 DIAGNOSIS — F4310 Post-traumatic stress disorder, unspecified: Secondary | ICD-10-CM

## 2017-04-23 MED ORDER — BUSPIRONE HCL 5 MG PO TABS: 5.0000 mg | ORAL_TABLET | Freq: Three times a day (TID) | ORAL | 1 refills | Status: DC

## 2017-04-23 NOTE — Telephone Encounter (Signed)
Was last filled for 1 mo supply on 04/02/17.

## 2017-04-23 NOTE — Telephone Encounter (Signed)
D/C paroxetine. Try buspirone (Buspar).  Meds ordered this encounter  Medications  . busPIRone (BUSPAR) 5 MG tablet    Sig: Take 1 tablet (5 mg total) by mouth 3 (three) times daily.    Dispense:  90 tablet    Refill:  1    Please cancel remaining fills of paroxetine. Patient reports insomnia.    Order Specific Question:   Supervising Provider    Answer:   Sherren MochaSHAW, EVA N [phone][[id number]]

## 2017-04-23 NOTE — Telephone Encounter (Signed)
Please advise 

## 2017-05-08 ENCOUNTER — Telehealth: Payer: Self-pay | Admitting: Physician Assistant

## 2017-05-08 NOTE — Telephone Encounter (Signed)
Pt has concerns about medication. States that buspirone may cause dizziness and he does not want that.  Please advise  [phone]

## 2017-05-08 NOTE — Telephone Encounter (Signed)
He is correct. Buspirone CAN cause dizziness in SOME people. So can Xanax (alprazolam). I'd like him to try it. If he gets dizzy, he should let me know.

## 2017-05-08 NOTE — Telephone Encounter (Signed)
Please advise 

## 2017-05-11 NOTE — Telephone Encounter (Signed)
I recommended he try this by phone, when he called reporting he wasn't able to sleep. I suggest that he come in to discuss with me. I continue to be concerned that he is not remembering well.  Please help him schedule a visit with me and ask him to bring his medications with him.

## 2017-05-11 NOTE — Telephone Encounter (Signed)
Spoke with Pt and he states he doesn't understand why you started him on this medication and is bothered that you didn't talk to him about it first. Pt states he isn't mad but just wants to understand and doesn't feel comfortable taking medication that has a  "big warning sign" for dizziness. Pt also states that he received alprazolam from the [place] and has not taken the buspirone.

## 2017-05-11 NOTE — Telephone Encounter (Signed)
We made a appointment for Mr. Samuel Vega on 05/16/2017 at 11:40 am. Thanks!

## 2017-05-16 ENCOUNTER — Ambulatory Visit: Payer: PPO | Admitting: Physician Assistant

## 2017-08-18 ENCOUNTER — Other Ambulatory Visit: Payer: Self-pay

## 2017-08-18 ENCOUNTER — Encounter: Payer: Self-pay | Admitting: Physician Assistant

## 2017-08-18 ENCOUNTER — Ambulatory Visit: Payer: PPO | Admitting: Physician Assistant

## 2017-08-18 VITALS — BP 122/76 | HR 82 | Temp 97.7°F | Resp 18 | Ht 67.75 in | Wt 183.4 lb

## 2017-08-18 DIAGNOSIS — H538 Other visual disturbances: Secondary | ICD-10-CM | POA: Diagnosis not present

## 2017-08-18 DIAGNOSIS — Z23 Encounter for immunization: Secondary | ICD-10-CM

## 2017-08-18 DIAGNOSIS — F4310 Post-traumatic stress disorder, unspecified: Secondary | ICD-10-CM | POA: Diagnosis not present

## 2017-08-18 MED ORDER — MIRTAZAPINE 7.5 MG PO TABS: 7.5000 mg | ORAL_TABLET | Freq: Every day | ORAL | 3 refills | Status: DC

## 2017-08-18 NOTE — Progress Notes (Signed)
Patient ID: Samuel Vega, male    DOB: 1923/12/16, 84 y.o.   MRN: [id number]  PCP: Porfirio Oar, PA-C  Chief Complaint  Patient presents with  . Medication Problem    Pt states the Xanax is causing blurry vision. Pt would like to discuss changing medication to help with PTSD.    Subjective:   Presents for evaluation of adverse medication effect.  He believes that the alprazolam, which he has taken for years to address PTSD, is causing blurred vision.  Most of his care is through the [place] [facility] in [place]. Working with his eye doctor to improve his vision. Experiences a lot of glare, especially at night.  "My nerves" are a problem. Stays nervous all the time. Using alprazolam regularly, dispensed by the [facility], but thinks it's contributing to his blurry vision. Uses the [employer]. He relates that the other manufacturers' product causes blurred vision, and desires the [facility]. He has not discussed this issue with the pharmacy at the [place], where he purchases this product.  Recall multiple previous attempts to treat PTSD and anxiety with non-benzodiazepine have failed. He took SSRI with apparently good results, for some time, then stopped it, but unclear why. Since then, he has reported intolerable adverse effects of everything tried, and I suspect some cognitive loss.   Review of Systems As above.    Patient Active Problem List   Diagnosis Date Noted  . Loss of weight 03/20/2014  . Chronic back pain   . Hyperlipidemia 07/29/2012  . Hearing loss 07/29/2012  . Macular degeneration   . Tinnitus   . ED (erectile dysfunction)   . PTSD (post-traumatic stress disorder)   . Aortic stenosis 09/11/2009     Prior to Admission medications   Medication Sig Start Date End Date Taking? Authorizing Provider  ALPRAZolam (XANAX) 0.25 MG tablet TAKE ONE TABLET BY MOUTH THREE TIMES A DAY AS NEEDED FOR ANXIETY 04/02/17  Yes Jonell Brumbaugh, PA-C    busPIRone (BUSPAR) 5 MG tablet Take 1 tablet (5 mg total) by mouth 3 (three) times daily. 04/23/17  No Porfirio Oar, PA-C  PRESCRIPTION MEDICATION Vitamin for eyes taking   Yes [provider]  sildenafil (VIAGRA) 100 MG tablet Take 0.5-1 tablets (50-100 mg total) by mouth daily as needed for erectile dysfunction. 07/04/13  Yes Porfirio Oar, PA-C     Allergies  Allergen Reactions  . Statins     MYALGIAS       Objective:  Physical Exam  Constitutional: He is oriented to person, place, and time. He appears well-developed and well-nourished. He is active and cooperative. No distress.  BP 122/76 (BP Location: Right Arm, Patient Position: Sitting, Cuff Size: Normal)   Pulse 82   Temp 97.7 F (36.5 C) (Oral)   Resp 18   Ht 5' 7.75" (1.721 m)   Wt 183 lb 6.4 oz (83.2 kg)   SpO2 96%   BMI 28.09 kg/m    Eyes: Conjunctivae are normal.  Pulmonary/Chest: Effort normal.  Neurological: He is alert and oriented to person, place, and time.  Psychiatric: He has a normal mood and affect. His speech is normal and behavior is normal.           Assessment & Plan:   Problem List Items Addressed This Visit    PTSD (post-traumatic stress disorder)    Inadequate control. Remains unwilling to try anything else. Recalls my concerns over his memory, and dismisses them. Called the [facility] pharmacy, but they  have closed for the day. Will try again, to request that the patient's preferred manufacturer be used for alprazolam.        Other Visit Diagnoses    Blurred vision    -  Primary   Likely cataracts, rather than alprazolam manufacturer. Pharmacy closed presently. Encouraged patient to work with his vision specialist.   Need for influenza vaccination       Relevant Orders   Flu Vaccine QUAD 36+ mos IM (Completed)   Need for pneumococcal vaccination       Relevant Orders   Pneumococcal conjugate vaccine 13-valent IM (Completed)       Return in about 4 weeks (around 09/15/2017)  for re-evaluation of nerves.   Fernande Brashelle S. Hermenia Fritcher, PA-C Primary Care at [facility]

## 2017-08-18 NOTE — Patient Instructions (Addendum)
Try the mirtazapine (Remeron) 7.5 mg at bedtime, to see if it helps your nerves. Hopefully, it will allow you to reduce the alprazolam (Xanax). It may take 1-2 weeks to get used to it. Please let me know if you have any side effects from it.    IF you received an x-ray today, you will receive an invoice from [facility]. Please contact [facility] at [phone] with questions or concerns regarding your invoice.   IF you received labwork today, you will receive an invoice from [facility]. Please contact [facility] at [phone] with questions or concerns regarding your invoice.   Our billing staff will not be able to assist you with questions regarding bills from these companies.  You will be contacted with the lab results as soon as they are available. The fastest way to get your results is to activate your My Chart account. Instructions are located on the last page of this paperwork. If you have not heard from us regarding the results in 2 weeks, please contact this office.

## 2017-08-20 ENCOUNTER — Telehealth: Payer: Self-pay | Admitting: Physician Assistant

## 2017-08-20 NOTE — Telephone Encounter (Signed)
Copied from CRM [id number]. Topic: Quick Communication - See Telephone Encounter >> Aug 20, 2017  9:15 AM Guinevere FerrariMorris, Lashawn Orrego E, NT wrote: CRM for notification. See Telephone encounter for: Patient is calling because he said he was at the doctor's office a couple of days ago and the doctor said she was going to give him a new prescription for stress. Pt does not remember the name and uses the [facility] [place]   08/20/17.

## 2017-08-20 NOTE — Telephone Encounter (Signed)
[place] [facility] [place] [place] for [place]- please expedite this it was sent to the wrong pharmacy (actually printed) They will overnight it for him.

## 2017-08-21 ENCOUNTER — Telehealth: Payer: Self-pay | Admitting: Physician Assistant

## 2017-08-21 NOTE — Telephone Encounter (Signed)
Copied from CRM [phone]. Topic: General - Other >> Aug 21, 2017  9:57 AM Lelon FrohlichGolden, Lamerle Jabs, RMA wrote: Reason for CRM: pt called back in regards to his medication which still has not been sent in pt stated that this is the 3rd time that this has happened and that he is happy about this confusion

## 2017-08-21 NOTE — Telephone Encounter (Signed)
Rx has been faxed over.

## 2017-08-21 NOTE — Telephone Encounter (Signed)
Copied from CRM [id number]. Topic: General - Other >> Aug 21, 2017  5:56 PM Stephannie LiSimmons, Deborh Pense L, NT wrote: Reason for CRM: Patient called back he would like a call from [name] ,and would like his medication sent to the [facility] [place] ,this is for  a new medication ,he does not know the name

## 2017-08-22 ENCOUNTER — Other Ambulatory Visit: Payer: Self-pay

## 2017-08-22 DIAGNOSIS — F4310 Post-traumatic stress disorder, unspecified: Secondary | ICD-10-CM

## 2017-08-22 MED ORDER — MIRTAZAPINE 7.5 MG PO TABS: 7.5000 mg | ORAL_TABLET | Freq: Every day | ORAL | 3 refills | Status: DC

## 2017-08-22 NOTE — Telephone Encounter (Signed)
Patient came in office about not receiving Remeron, Called and verified with pharmacy. Reordered script for pt.

## 2017-09-01 ENCOUNTER — Telehealth: Payer: Self-pay | Admitting: Physician Assistant

## 2017-09-01 NOTE — Telephone Encounter (Signed)
Please advise 

## 2017-09-01 NOTE — Telephone Encounter (Signed)
Copied from CRM [id number]. Topic: Quick Communication - See Telephone Encounter >> Sep 01, 2017 12:41 PM Terisa Starraylor, Brittany L wrote: CRM for notification. See Telephone encounter for:   09/01/17.  Patient said the mirtazapine (REMERON) 7.5 MG tablet is not helping him sleep. He said he is not sleeping good at all, tossing and turning all night.  Please advise. Call back is [phone]

## 2017-09-01 NOTE — Telephone Encounter (Signed)
Advise patient to INCREASE the Remeron (mirtazapine) to 15 mg at bedtime, by taking 2 of the 7.5 mg tablets.

## 2017-09-02 NOTE — Telephone Encounter (Signed)
Advised Pt to try taking 2 pills at night and advised him if that still doesn't work to let us know.

## 2017-09-07 NOTE — Assessment & Plan Note (Signed)
Inadequate control. Remains unwilling to try anything else. Recalls my concerns over his memory, and dismisses them. [facility], but they have closed for the day. Will try again, to request that the patient's preferred manufacturer be used for alprazolam.

## 2017-09-15 ENCOUNTER — Ambulatory Visit: Payer: PPO | Admitting: Physician Assistant

## 2017-09-15 ENCOUNTER — Ambulatory Visit: Payer: PPO

## 2017-10-08 ENCOUNTER — Inpatient Hospital Stay (HOSPITAL_COMMUNITY)
Admission: EM | Admit: 2017-10-08 | Discharge: 2017-10-14 | DRG: 064 | Disposition: A | Discharge status: Skilled Nursing Facility | Payer: PPO | Attending: Internal Medicine | Admitting: Internal Medicine

## 2017-10-08 ENCOUNTER — Encounter (HOSPITAL_COMMUNITY): Payer: Self-pay

## 2017-10-08 ENCOUNTER — Other Ambulatory Visit: Payer: Self-pay

## 2017-10-08 ENCOUNTER — Emergency Department (HOSPITAL_COMMUNITY): Payer: PPO

## 2017-10-08 DIAGNOSIS — I63421 Cerebral infarction due to embolism of right anterior cerebral artery: Secondary | ICD-10-CM | POA: Diagnosis present

## 2017-10-08 DIAGNOSIS — R29704 NIHSS score 4: Secondary | ICD-10-CM | POA: Diagnosis present

## 2017-10-08 DIAGNOSIS — E785 Hyperlipidemia, unspecified: Secondary | ICD-10-CM | POA: Diagnosis present

## 2017-10-08 DIAGNOSIS — D62 Acute posthemorrhagic anemia: Secondary | ICD-10-CM | POA: Diagnosis not present

## 2017-10-08 DIAGNOSIS — I4510 Unspecified right bundle-branch block: Secondary | ICD-10-CM | POA: Diagnosis present

## 2017-10-08 DIAGNOSIS — F0390 Unspecified dementia without behavioral disturbance: Secondary | ICD-10-CM | POA: Diagnosis present

## 2017-10-08 DIAGNOSIS — T148XXA Other injury of unspecified body region, initial encounter: Secondary | ICD-10-CM | POA: Diagnosis not present

## 2017-10-08 DIAGNOSIS — I639 Cerebral infarction, unspecified: Secondary | ICD-10-CM | POA: Diagnosis present

## 2017-10-08 DIAGNOSIS — Z888 Allergy status to other drugs, medicaments and biological substances status: Secondary | ICD-10-CM

## 2017-10-08 DIAGNOSIS — I21A1 Myocardial infarction type 2: Secondary | ICD-10-CM | POA: Diagnosis present

## 2017-10-08 DIAGNOSIS — R55 Syncope and collapse: Secondary | ICD-10-CM

## 2017-10-08 DIAGNOSIS — Z79899 Other long term (current) drug therapy: Secondary | ICD-10-CM

## 2017-10-08 DIAGNOSIS — W1830XA Fall on same level, unspecified, initial encounter: Secondary | ICD-10-CM | POA: Diagnosis present

## 2017-10-08 DIAGNOSIS — H3530 Unspecified macular degeneration: Secondary | ICD-10-CM | POA: Diagnosis present

## 2017-10-08 DIAGNOSIS — I350 Nonrheumatic aortic (valve) stenosis: Secondary | ICD-10-CM

## 2017-10-08 DIAGNOSIS — R7989 Other specified abnormal findings of blood chemistry: Secondary | ICD-10-CM | POA: Diagnosis present

## 2017-10-08 DIAGNOSIS — M79641 Pain in right hand: Secondary | ICD-10-CM | POA: Diagnosis not present

## 2017-10-08 DIAGNOSIS — R778 Other specified abnormalities of plasma proteins: Secondary | ICD-10-CM | POA: Diagnosis present

## 2017-10-08 DIAGNOSIS — D72829 Elevated white blood cell count, unspecified: Secondary | ICD-10-CM

## 2017-10-08 DIAGNOSIS — R748 Abnormal levels of other serum enzymes: Secondary | ICD-10-CM | POA: Diagnosis not present

## 2017-10-08 DIAGNOSIS — M25569 Pain in unspecified knee: Secondary | ICD-10-CM | POA: Diagnosis not present

## 2017-10-08 DIAGNOSIS — M6282 Rhabdomyolysis: Secondary | ICD-10-CM | POA: Diagnosis present

## 2017-10-08 DIAGNOSIS — M25552 Pain in left hip: Secondary | ICD-10-CM | POA: Diagnosis not present

## 2017-10-08 DIAGNOSIS — Z638 Other specified problems related to primary support group: Secondary | ICD-10-CM | POA: Diagnosis not present

## 2017-10-08 DIAGNOSIS — F4310 Post-traumatic stress disorder, unspecified: Secondary | ICD-10-CM | POA: Diagnosis present

## 2017-10-08 DIAGNOSIS — I63233 Cerebral infarction due to unspecified occlusion or stenosis of bilateral carotid arteries: Secondary | ICD-10-CM | POA: Diagnosis not present

## 2017-10-08 DIAGNOSIS — I252 Old myocardial infarction: Secondary | ICD-10-CM

## 2017-10-08 DIAGNOSIS — G8194 Hemiplegia, unspecified affecting left nondominant side: Secondary | ICD-10-CM | POA: Diagnosis present

## 2017-10-08 DIAGNOSIS — M6281 Muscle weakness (generalized): Secondary | ICD-10-CM | POA: Diagnosis not present

## 2017-10-08 DIAGNOSIS — F419 Anxiety disorder, unspecified: Secondary | ICD-10-CM | POA: Diagnosis present

## 2017-10-08 DIAGNOSIS — S0012XA Contusion of left eyelid and periocular area, initial encounter: Secondary | ICD-10-CM | POA: Diagnosis present

## 2017-10-08 DIAGNOSIS — R2689 Other abnormalities of gait and mobility: Secondary | ICD-10-CM | POA: Diagnosis not present

## 2017-10-08 DIAGNOSIS — H9319 Tinnitus, unspecified ear: Secondary | ICD-10-CM | POA: Diagnosis present

## 2017-10-08 DIAGNOSIS — W19XXXA Unspecified fall, initial encounter: Secondary | ICD-10-CM | POA: Diagnosis not present

## 2017-10-08 DIAGNOSIS — I10 Essential (primary) hypertension: Secondary | ICD-10-CM | POA: Diagnosis present

## 2017-10-08 DIAGNOSIS — T796XXD Traumatic ischemia of muscle, subsequent encounter: Secondary | ICD-10-CM | POA: Diagnosis not present

## 2017-10-08 DIAGNOSIS — R278 Other lack of coordination: Secondary | ICD-10-CM | POA: Diagnosis not present

## 2017-10-08 DIAGNOSIS — I6521 Occlusion and stenosis of right carotid artery: Secondary | ICD-10-CM | POA: Diagnosis present

## 2017-10-08 DIAGNOSIS — S299XXA Unspecified injury of thorax, initial encounter: Secondary | ICD-10-CM | POA: Diagnosis not present

## 2017-10-08 DIAGNOSIS — I69954 Hemiplegia and hemiparesis following unspecified cerebrovascular disease affecting left non-dominant side: Secondary | ICD-10-CM | POA: Diagnosis not present

## 2017-10-08 DIAGNOSIS — S79912A Unspecified injury of left hip, initial encounter: Secondary | ICD-10-CM | POA: Diagnosis not present

## 2017-10-08 DIAGNOSIS — S0990XA Unspecified injury of head, initial encounter: Secondary | ICD-10-CM | POA: Diagnosis not present

## 2017-10-08 DIAGNOSIS — G464 Cerebellar stroke syndrome: Secondary | ICD-10-CM | POA: Diagnosis not present

## 2017-10-08 DIAGNOSIS — S3993XA Unspecified injury of pelvis, initial encounter: Secondary | ICD-10-CM | POA: Diagnosis not present

## 2017-10-08 DIAGNOSIS — S59902A Unspecified injury of left elbow, initial encounter: Secondary | ICD-10-CM | POA: Diagnosis not present

## 2017-10-08 DIAGNOSIS — J80 Acute respiratory distress syndrome: Secondary | ICD-10-CM | POA: Diagnosis not present

## 2017-10-08 DIAGNOSIS — M25522 Pain in left elbow: Secondary | ICD-10-CM | POA: Diagnosis not present

## 2017-10-08 DIAGNOSIS — S199XXA Unspecified injury of neck, initial encounter: Secondary | ICD-10-CM | POA: Diagnosis not present

## 2017-10-08 DIAGNOSIS — R41841 Cognitive communication deficit: Secondary | ICD-10-CM | POA: Diagnosis not present

## 2017-10-08 DIAGNOSIS — I4891 Unspecified atrial fibrillation: Secondary | ICD-10-CM | POA: Diagnosis present

## 2017-10-08 LAB — COMPREHENSIVE METABOLIC PANEL
ALT: 45 U/L (ref 17–63)
AST: 169 U/L — ABNORMAL HIGH (ref 15–41)
Albumin: 3.5 g/dL (ref 3.5–5.0)
Alkaline Phosphatase: 77 U/L (ref 38–126)
Anion gap: 14 (ref 5–15)
BUN: 25 mg/dL — ABNORMAL HIGH (ref 6–20)
CO2: 22 mmol/L (ref 22–32)
Calcium: 9.3 mg/dL (ref 8.9–10.3)
Chloride: 108 mmol/L (ref 101–111)
Creatinine, Ser: 1.01 mg/dL (ref 0.61–1.24)
GFR calc Af Amer: >60 mL/min (ref >60)
GFR calc non Af Amer: >60 mL/min (ref >60)
Glucose, Bld: 117 mg/dL — ABNORMAL HIGH (ref 65–99)
Potassium: 4.1 mmol/L (ref 3.5–5.1)
Sodium: 144 mmol/L (ref 135–145)
Total Bilirubin: 1.1 mg/dL (ref 0.3–1.2)
Total Protein: 7.2 g/dL (ref 6.5–8.1)

## 2017-10-08 LAB — CBC WITH DIFFERENTIAL/PLATELET
Basophils Absolute: 0 10*3/uL (ref 0.0–0.1)
Basophils Relative: 0 %
Eosinophils Absolute: 0 10*3/uL (ref 0.0–0.7)
Eosinophils Relative: 0 %
HCT: 42.9 % (ref 39.0–52.0)
Hemoglobin: 14 g/dL (ref 13.0–17.0)
Lymphocytes Relative: 6 %
Lymphs Abs: 0.9 10*3/uL (ref 0.7–4.0)
MCH: 27.3 pg (ref 26.0–34.0)
MCHC: 32.6 g/dL (ref 30.0–36.0)
MCV: 83.8 fL (ref 78.0–100.0)
Monocytes Absolute: 1.3 10*3/uL — ABNORMAL HIGH (ref 0.1–1.0)
Monocytes Relative: 8 %
Neutro Abs: 13.7 10*3/uL — ABNORMAL HIGH (ref 1.7–7.7)
Neutrophils Relative %: 86 %
Platelets: 180 10*3/uL (ref 150–400)
RBC: 5.12 MIL/uL (ref 4.22–5.81)
RDW: 14.5 % (ref 11.5–15.5)
WBC: 15.9 10*3/uL — ABNORMAL HIGH (ref 4.0–10.5)

## 2017-10-08 LAB — CK: Total CK: 3971 U/L — ABNORMAL HIGH (ref 49–397)

## 2017-10-08 MED ORDER — SODIUM CHLORIDE 0.9 % IV BOLUS (SEPSIS)
500.0000 mL | Freq: Once | INTRAVENOUS | Status: AC
  Administered 2017-10-09: 500 mL via INTRAVENOUS

## 2017-10-08 NOTE — ED Notes (Signed)
Pt stating he thinks there is someone playing tricks on him because when he came to after falling and passing out, his house did not look the same; the furniture was his but it did not look right. Pt states he has been stressed recently and that he can't trust people any more, thinks people want to steal the appliances from his house.

## 2017-10-08 NOTE — ED Notes (Signed)
Family bedside, states patient has no hx of AMS, only PTSD

## 2017-10-08 NOTE — ED Triage Notes (Signed)
Pt arrives to ED from home with complaints of fall since "some time last night". EMS reports pt lives at home alone, does not communicate with his family very frequently. Pt was found on the floor, laying on his left side. Left eye and arm are purple colored upon arrival to ED. Pt placed in position of comfort with bed locked and lowered, call bell in reach.

## 2017-10-08 NOTE — Consult Note (Signed)
Called by ED to discuss subtle low attenuation finding on CT head, per Radiology report. I have reviewed the images and discussed with EDP. There is patchy hypoattenuation throughout the cerebral hemispheres, which appears most consistent with moderate chronic small vessel ischemia. The region of interest described by Radiology may be a subacute infarct, but appears more likely to be chronic. History discussed with EDP and labs have also been reviewed. Elevated CK and AST noted, as well as leukocytosis and elevated BUN/Cr ratio. I have recommended that the finding be further assessed with MRI brain. Please call neurology if an acute or subacute lesion is seen on MRI.   Electronically signed: Dr. Caryl PinaEric Landen Knoedler

## 2017-10-09 ENCOUNTER — Inpatient Hospital Stay (HOSPITAL_COMMUNITY): Payer: PPO

## 2017-10-09 ENCOUNTER — Ambulatory Visit (HOSPITAL_COMMUNITY): Payer: PPO

## 2017-10-09 ENCOUNTER — Emergency Department (HOSPITAL_COMMUNITY): Payer: PPO

## 2017-10-09 ENCOUNTER — Encounter (HOSPITAL_COMMUNITY): Payer: Self-pay | Admitting: Internal Medicine

## 2017-10-09 DIAGNOSIS — I639 Cerebral infarction, unspecified: Secondary | ICD-10-CM

## 2017-10-09 DIAGNOSIS — D72829 Elevated white blood cell count, unspecified: Secondary | ICD-10-CM | POA: Diagnosis present

## 2017-10-09 DIAGNOSIS — R55 Syncope and collapse: Secondary | ICD-10-CM

## 2017-10-09 DIAGNOSIS — T796XXD Traumatic ischemia of muscle, subsequent encounter: Secondary | ICD-10-CM | POA: Diagnosis not present

## 2017-10-09 DIAGNOSIS — Z888 Allergy status to other drugs, medicaments and biological substances status: Secondary | ICD-10-CM | POA: Diagnosis not present

## 2017-10-09 DIAGNOSIS — I6521 Occlusion and stenosis of right carotid artery: Secondary | ICD-10-CM

## 2017-10-09 DIAGNOSIS — I10 Essential (primary) hypertension: Secondary | ICD-10-CM | POA: Diagnosis present

## 2017-10-09 DIAGNOSIS — I4510 Unspecified right bundle-branch block: Secondary | ICD-10-CM | POA: Diagnosis present

## 2017-10-09 DIAGNOSIS — I4891 Unspecified atrial fibrillation: Secondary | ICD-10-CM | POA: Diagnosis present

## 2017-10-09 DIAGNOSIS — I21A1 Myocardial infarction type 2: Secondary | ICD-10-CM | POA: Diagnosis present

## 2017-10-09 DIAGNOSIS — R29704 NIHSS score 4: Secondary | ICD-10-CM | POA: Diagnosis present

## 2017-10-09 DIAGNOSIS — Z638 Other specified problems related to primary support group: Secondary | ICD-10-CM | POA: Diagnosis not present

## 2017-10-09 DIAGNOSIS — I252 Old myocardial infarction: Secondary | ICD-10-CM | POA: Diagnosis not present

## 2017-10-09 DIAGNOSIS — H9319 Tinnitus, unspecified ear: Secondary | ICD-10-CM | POA: Diagnosis present

## 2017-10-09 DIAGNOSIS — Z79899 Other long term (current) drug therapy: Secondary | ICD-10-CM | POA: Diagnosis not present

## 2017-10-09 DIAGNOSIS — W19XXXA Unspecified fall, initial encounter: Secondary | ICD-10-CM

## 2017-10-09 DIAGNOSIS — F419 Anxiety disorder, unspecified: Secondary | ICD-10-CM | POA: Diagnosis present

## 2017-10-09 DIAGNOSIS — W1830XA Fall on same level, unspecified, initial encounter: Secondary | ICD-10-CM | POA: Diagnosis present

## 2017-10-09 DIAGNOSIS — F4310 Post-traumatic stress disorder, unspecified: Secondary | ICD-10-CM | POA: Diagnosis present

## 2017-10-09 DIAGNOSIS — I63421 Cerebral infarction due to embolism of right anterior cerebral artery: Secondary | ICD-10-CM | POA: Diagnosis present

## 2017-10-09 DIAGNOSIS — D62 Acute posthemorrhagic anemia: Secondary | ICD-10-CM | POA: Diagnosis not present

## 2017-10-09 DIAGNOSIS — E785 Hyperlipidemia, unspecified: Secondary | ICD-10-CM | POA: Diagnosis present

## 2017-10-09 DIAGNOSIS — R778 Other specified abnormalities of plasma proteins: Secondary | ICD-10-CM | POA: Diagnosis present

## 2017-10-09 DIAGNOSIS — H3530 Unspecified macular degeneration: Secondary | ICD-10-CM | POA: Diagnosis present

## 2017-10-09 DIAGNOSIS — S0012XA Contusion of left eyelid and periocular area, initial encounter: Secondary | ICD-10-CM | POA: Diagnosis present

## 2017-10-09 DIAGNOSIS — M6282 Rhabdomyolysis: Secondary | ICD-10-CM

## 2017-10-09 DIAGNOSIS — R7989 Other specified abnormal findings of blood chemistry: Secondary | ICD-10-CM

## 2017-10-09 DIAGNOSIS — I350 Nonrheumatic aortic (valve) stenosis: Secondary | ICD-10-CM | POA: Diagnosis present

## 2017-10-09 DIAGNOSIS — R748 Abnormal levels of other serum enzymes: Secondary | ICD-10-CM | POA: Diagnosis not present

## 2017-10-09 DIAGNOSIS — F0390 Unspecified dementia without behavioral disturbance: Secondary | ICD-10-CM | POA: Diagnosis present

## 2017-10-09 DIAGNOSIS — G8194 Hemiplegia, unspecified affecting left nondominant side: Secondary | ICD-10-CM | POA: Diagnosis present

## 2017-10-09 HISTORY — DX: Cerebral infarction, unspecified: I63.9

## 2017-10-09 LAB — BASIC METABOLIC PANEL
ANION GAP: 14 (ref 5–15)
BUN: 29 mg/dL — ABNORMAL HIGH (ref 6–20)
CO2: 21 mmol/L — ABNORMAL LOW (ref 22–32)
Calcium: 8.8 mg/dL — ABNORMAL LOW (ref 8.9–10.3)
Chloride: 108 mmol/L (ref 101–111)
Creatinine, Ser: 1.02 mg/dL (ref 0.61–1.24)
GLUCOSE: 119 mg/dL — AB (ref 65–99)
Potassium: 4 mmol/L (ref 3.5–5.1)
Sodium: 143 mmol/L (ref 135–145)

## 2017-10-09 LAB — LIPID PANEL
Cholesterol: 235 mg/dL — ABNORMAL HIGH (ref 0–200)
HDL: 96 mg/dL
LDL Cholesterol: 122 mg/dL — ABNORMAL HIGH (ref 0–99)
Total CHOL/HDL Ratio: 2.4 ratio
Triglycerides: 87 mg/dL
VLDL: 17 mg/dL (ref 0–40)

## 2017-10-09 LAB — CBC WITH DIFFERENTIAL/PLATELET
Basophils Absolute: 0 K/uL (ref 0.0–0.1)
Basophils Relative: 0 %
Eosinophils Absolute: 0 K/uL (ref 0.0–0.7)
Eosinophils Relative: 0 %
HCT: 39.4 % (ref 39.0–52.0)
Hemoglobin: 12.9 g/dL — ABNORMAL LOW (ref 13.0–17.0)
Lymphocytes Relative: 11 %
Lymphs Abs: 1.5 K/uL (ref 0.7–4.0)
MCH: 27.3 pg (ref 26.0–34.0)
MCHC: 32.7 g/dL (ref 30.0–36.0)
MCV: 83.5 fL (ref 78.0–100.0)
Monocytes Absolute: 1.2 K/uL — ABNORMAL HIGH (ref 0.1–1.0)
Monocytes Relative: 9 %
Neutro Abs: 10.9 K/uL — ABNORMAL HIGH (ref 1.7–7.7)
Neutrophils Relative %: 80 %
Platelets: 177 K/uL (ref 150–400)
RBC: 4.72 MIL/uL (ref 4.22–5.81)
RDW: 14.5 % (ref 11.5–15.5)
WBC: 13.6 K/uL — ABNORMAL HIGH (ref 4.0–10.5)

## 2017-10-09 LAB — HEPATIC FUNCTION PANEL
ALT: 43 U/L (ref 17–63)
AST: 157 U/L — ABNORMAL HIGH (ref 15–41)
Albumin: 3.2 g/dL — ABNORMAL LOW (ref 3.5–5.0)
Alkaline Phosphatase: 69 U/L (ref 38–126)
BILIRUBIN INDIRECT: 0.7 mg/dL (ref 0.3–0.9)
Bilirubin, Direct: 0.2 mg/dL (ref 0.1–0.5)
TOTAL PROTEIN: 6.2 g/dL — AB (ref 6.5–8.1)
Total Bilirubin: 0.9 mg/dL (ref 0.3–1.2)

## 2017-10-09 LAB — CK: CK TOTAL: 4000 U/L — AB (ref 49–397)

## 2017-10-09 LAB — HEMOGLOBIN A1C
HEMOGLOBIN A1C: 5.2 % (ref 4.8–5.6)
Mean Plasma Glucose: 102.54 mg/dL

## 2017-10-09 LAB — TROPONIN I
TROPONIN I: 1.71 ng/mL — AB (ref <0.03)
Troponin I: 1.71 ng/mL (ref <0.03)
Troponin I: 1.34 ng/mL
Troponin I: 1.22 ng/mL

## 2017-10-09 MED ORDER — ACETAMINOPHEN 160 MG/5ML PO SOLN: 650.0000 mg | ORAL | Status: DC | PRN

## 2017-10-09 MED ORDER — ASPIRIN 325 MG PO TABS
325.0000 mg | ORAL_TABLET | Freq: Every day | ORAL | Status: DC
  Administered 2017-10-10 – 2017-10-14 (×5): 325 mg via ORAL
  Filled 2017-10-09 (×6): qty 1

## 2017-10-09 MED ORDER — STROKE: EARLY STAGES OF RECOVERY BOOK
Freq: Once | Status: DC
  Filled 2017-10-09: qty 1

## 2017-10-09 MED ORDER — SODIUM CHLORIDE 0.9 % IV SOLN: INTRAVENOUS | Status: AC

## 2017-10-09 MED ORDER — ENOXAPARIN SODIUM 40 MG/0.4ML ~~LOC~~ SOLN
40.0000 mg | SUBCUTANEOUS | Status: DC
  Administered 2017-10-10 – 2017-10-14 (×5): 40 mg via SUBCUTANEOUS
  Filled 2017-10-09 (×6): qty 0.4

## 2017-10-09 MED ORDER — ACETAMINOPHEN 650 MG RE SUPP: 650.0000 mg | RECTAL | Status: DC | PRN

## 2017-10-09 MED ORDER — ASPIRIN 81 MG PO CHEW
324.0000 mg | CHEWABLE_TABLET | Freq: Once | ORAL | Status: AC
  Administered 2017-10-09: 324 mg via ORAL
  Filled 2017-10-09: qty 4

## 2017-10-09 MED ORDER — IOPAMIDOL (ISOVUE-370) INJECTION 76%
INTRAVENOUS | Status: AC
  Administered 2017-10-09: 50 mL
  Filled 2017-10-09: qty 50

## 2017-10-09 MED ORDER — LORAZEPAM 2 MG/ML IJ SOLN
0.5000 mg | Freq: Once | INTRAMUSCULAR | Status: AC
  Administered 2017-10-09: 0.5 mg via INTRAVENOUS
  Filled 2017-10-09: qty 1

## 2017-10-09 MED ORDER — ACETAMINOPHEN 325 MG PO TABS: 650.0000 mg | ORAL_TABLET | ORAL | Status: DC | PRN

## 2017-10-09 MED ORDER — ASPIRIN 300 MG RE SUPP: 300.0000 mg | Freq: Every day | RECTAL | Status: DC

## 2017-10-09 NOTE — ED Notes (Signed)
Pt given Coke, OK per MD Toniann FailKakrakandy

## 2017-10-09 NOTE — ED Notes (Signed)
Attempted PIV x 2; no success at this time.  

## 2017-10-09 NOTE — Progress Notes (Signed)
CARDIOLOGY   Was not available when we attempted to round.  He has critical aortic stenosis based upon an echo done several years ago.  He has been lost to follow-up.  We will follow but do not anticipate any cardiac intervention given recent stroke.

## 2017-10-09 NOTE — Procedures (Signed)
ELECTROENCEPHALOGRAM REPORT  Date of Study: 10/09/2017  Patient's Name: Samuel Vega MRN: [id number] Date of Birth: 1924/07/13  Referring Provider: Dr. Midge MiniumArshad Kakrakandy  Clinical History: This is a 84 year old man with unwitnessed fall.  Medications: Tylenol aspirin  Technical Summary: A multichannel digital EEG recording measured by the international 10-20 system with electrodes applied with paste and impedances below 5000 ohms performed as portable with EKG monitoring in an awake and asleep patient.  Hyperventilation and photic stimulation were not performed.  The digital EEG was referentially recorded, reformatted, and digitally filtered in a variety of bipolar and referential montages for optimal display.   Description: The patient is awake and asleep during the recording.  During maximal wakefulness, there is a symmetric, medium voltage 6-7 Hz posterior dominant rhythm that attenuates with eye opening. This is admixed with a small amount of diffuse 4-5 Hz theta and 2-3 Hz delta slowing of the waking background. There is additional polymorphic theta and delta slowing over the right posterior quadrant region. During drowsiness and sleep, there is an increase in theta slowing of the background.  Vertex waves and sleep spindles were seen.  Hyperventilation and photic stimulation were not performed. There were no epileptiform discharges or electrographic seizures seen.    EKG lead was unremarkable.  Impression: This awake and asleep EEG is abnormal due to the presence of: 1. Mild diffuse slowing of the waking background 2. Additional focal slowing over the right posterior quadrant  Clinical Correlation of the above findings indicates diffuse cerebral dysfunction that is non-specific in etiology and can be seen with hypoxic/ischemic injury, toxic/metabolic encephalopathies, neurodegenerative disorders, or medication effect. Focal slowing over the right posterior quadrant indicates  focal cerebral dysfunction in this region suggestive of underlying structural or physiologic abnormality. The absence of epileptiform discharges does not rule out a clinical diagnosis of epilepsy.  Clinical correlation is advised.   Patrcia DollyKaren Herma Uballe, M.D.

## 2017-10-09 NOTE — ED Notes (Signed)
Neurology at the bedside

## 2017-10-09 NOTE — ED Provider Notes (Signed)
[facility] EMERGENCY DEPARTMENT Provider Note   CSN: [id number] Arrival date & time: 10/08/17  2016     History   Chief Complaint Chief Complaint  Patient presents with  . Fall    HPI Samuel Vega is a 84 y.o. male.  HPI Patient presents to the emergency department with possible syncope or fall.  The patient is not very clear as to what happened but he thinks he passed out.  He is also concerned that there may be some one who broke into his apartment and the police found things to be severely disheveled around the house.  Patient states that he is unsure of when he fell but he thinks was sometime yesterday.  He states that he has been lying on the floor since that time.  Patient does have some trouble recognizing family members that have come into the room but can tell me the month and the year.  The patient denies chest pain, shortness of breath, headache,blurred vision, neck pain, fever, cough, weakness, numbness, dizziness, anorexia, edema, abdominal pain, nausea, vomiting, diarrhea, rash, back pain, dysuria, hematemesis, bloody stool.  Past Medical History:  Diagnosis Date  . Anxiety   . Cardiac murmur   . Chronic back pain   . ED (erectile dysfunction)   . Hyperlipidemia   . Macular degeneration   . PTSD (post-traumatic stress disorder)   . 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 Loss of weight 03/20/2014  . Chronic back pain   . Hyperlipidemia 07/29/2012  . Hearing loss 07/29/2012  . Macular degeneration   . Tinnitus   . ED (erectile dysfunction)   . PTSD (post-traumatic stress disorder)   . Aortic stenosis 09/11/2009    Past Surgical History:  Procedure Laterality Date  . ELBOW SURGERY    . EYE SURGERY         Home Medications    Prior to Admission medications   Medication Sig Start Date End Date Taking? Authorizing Provider    ALPRAZolam (XANAX) 0.25 MG tablet TAKE ONE TABLET BY MOUTH THREE TIMES A DAY AS NEEDED FOR ANXIETY 04/02/17   Jeffery, Chelle, PA-C  mirtazapine (REMERON) 7.5 MG tablet Take 1 tablet (7.5 mg total) by mouth at bedtime. 08/22/17   Porfirio Oar, PA-C  PRESCRIPTION MEDICATION Vitamin for eyes taking    [provider]  sildenafil (VIAGRA) 100 MG tablet Take 0.5-1 tablets (50-100 mg total) by mouth daily as needed for erectile dysfunction. 07/04/13   Porfirio Oar, PA-C    Family History History reviewed. No pertinent family history.  Social History Social History   Tobacco Use  . Smoking status: Never Smoker  . Smokeless tobacco: Never Used  Substance Use Topics  . Alcohol use: No    Comment: none since 1980  . Drug use: No     Allergies   Statins   Review of Systems Review of Systems  All other systems negative except as documented in the HPI. All pertinent positives and negatives as reviewed in the HPI. Physical Exam Updated Vital Signs BP (!) 140/59   Pulse (!) 58   Temp 98.1 F (36.7 C) (Axillary)   Resp 14   Ht 5\' 8"  (1.727 m)   Wt 81.6 kg (180 lb)   SpO2 99%   BMI 27.37 kg/m   Physical Exam  Constitutional: He is oriented to  person, place, and time. He appears well-developed and well-nourished. No distress.  HENT:  Head: Normocephalic and atraumatic.  Mouth/Throat: Oropharynx is clear and moist.  Eyes: Pupils are equal, round, and reactive to light.  Neck: Normal range of motion. Neck supple.  Cardiovascular: Normal rate, regular rhythm and normal heart sounds. Exam reveals no gallop and no friction rub.  No murmur heard. Pulmonary/Chest: Effort normal and breath sounds normal. No respiratory distress. He has no wheezes.  Abdominal: Soft. Bowel sounds are normal. He exhibits no distension. There is no tenderness.  Neurological: He is alert and oriented to person, place, and time. He exhibits normal muscle tone. Coordination normal.  Skin: Skin  is warm and dry. Capillary refill takes less than 2 seconds. No rash noted. No erythema.  Psychiatric: He has a normal mood and affect. His behavior is normal.  Nursing note and vitals reviewed.    ED Treatments / Results  Labs (all labs ordered are listed, but only abnormal results are displayed) Labs Reviewed  COMPREHENSIVE METABOLIC PANEL - Abnormal; Notable for the following components:      Result Value   Glucose, Bld 117 (*)    BUN 25 (*)    AST 169 (*)    All other components within normal limits  CBC WITH DIFFERENTIAL/PLATELET - Abnormal; Notable for the following components:   WBC 15.9 (*)    Neutro Abs 13.7 (*)    Monocytes Absolute 1.3 (*)    All other components within normal limits  CK - Abnormal; Notable for the following components:   Total CK 3,971 (*)    All other components within normal limits  URINALYSIS, ROUTINE W REFLEX MICROSCOPIC  TROPONIN I    EKG  EKG Interpretation  Date/Time:  Thursday October 08 2017 20:23:06 EST Ventricular Rate:  83 PR Interval:    QRS Duration: 126 QT Interval:  394 QTC Calculation: 463 R Axis:   63 Text Interpretation:  Atrial fibrillation Right bundle branch block Artifact in lead(s) I II aVR aVL aVF V1 Confirmed by Tilden Fossaees, Elizabeth [phone]([id number]) on 10/08/2017 8:40:31 PM       Radiology Dg Chest 2 View  Result Date: 10/08/2017 CLINICAL DATA:  Fall EXAM: CHEST  2 VIEW COMPARISON:  None. FINDINGS: Mild cardiomegaly. Bibasilar atelectasis. No effusions. No edema. No acute bony abnormality. IMPRESSION: Mild cardiomegaly.  Bibasilar atelectasis. Electronically Signed   By: Charlett NoseKevin  Dover M.D.   On: 10/08/2017 23:17   Ct Head Wo Contrast  Result Date: 10/08/2017 CLINICAL DATA:  Found on floor.  Bruising to left side of face EXAM: CT HEAD WITHOUT CONTRAST CT CERVICAL SPINE WITHOUT CONTRAST TECHNIQUE: Multidetector CT imaging of the head and cervical spine was performed following the standard protocol without intravenous contrast.  Multiplanar CT image reconstructions of the cervical spine were also generated. COMPARISON:  None. FINDINGS: CT HEAD FINDINGS Brain: Area of low-density noted in the posteromedial right parietal lobe concerning for acute infarct. There is atrophy and chronic small vessel disease changes. No hemorrhage or hydrocephalus. Vascular: No hyperdense vessel or unexpected calcification. Skull: No acute calvarial abnormality. Sinuses/Orbits: No acute findings Other: None CT CERVICAL SPINE FINDINGS Alignment: No subluxation. Skull base and vertebrae: No fracture Soft tissues and spinal canal: No prevertebral fluid or swelling. No visible canal hematoma. Disc levels:  Diffuse degenerative disc and facet disease. Upper chest: No acute findings Other: No acute findings IMPRESSION: Subtle low-density in the posteromedial high right parietal lobe concerning for acute infarction. No hemorrhage. Diffuse degenerative disc and  facet disease in the cervical spine. No acute bony abnormality. Electronically Signed   By: Charlett Nose M.D.   On: 10/08/2017 23:33   Ct Cervical Spine Wo Contrast  Result Date: 10/08/2017 CLINICAL DATA:  Found on floor.  Bruising to left side of face EXAM: CT HEAD WITHOUT CONTRAST CT CERVICAL SPINE WITHOUT CONTRAST TECHNIQUE: Multidetector CT imaging of the head and cervical spine was performed following the standard protocol without intravenous contrast. Multiplanar CT image reconstructions of the cervical spine were also generated. COMPARISON:  None. FINDINGS: CT HEAD FINDINGS Brain: Area of low-density noted in the posteromedial right parietal lobe concerning for acute infarct. There is atrophy and chronic small vessel disease changes. No hemorrhage or hydrocephalus. Vascular: No hyperdense vessel or unexpected calcification. Skull: No acute calvarial abnormality. Sinuses/Orbits: No acute findings Other: None CT CERVICAL SPINE FINDINGS Alignment: No subluxation. Skull base and vertebrae: No fracture Soft  tissues and spinal canal: No prevertebral fluid or swelling. No visible canal hematoma. Disc levels:  Diffuse degenerative disc and facet disease. Upper chest: No acute findings Other: No acute findings IMPRESSION: Subtle low-density in the posteromedial high right parietal lobe concerning for acute infarction. No hemorrhage. Diffuse degenerative disc and facet disease in the cervical spine. No acute bony abnormality. Electronically Signed   By: Charlett Nose M.D.   On: 10/08/2017 23:33   Dg Hand Complete Left  Result Date: 10/08/2017 CLINICAL DATA:  Fall.  Right hand pain EXAM: LEFT HAND - COMPLETE 3+ VIEW COMPARISON:  None. FINDINGS: No acute bony abnormality. Specifically, no fracture, subluxation, or dislocation. IMPRESSION: No acute bony abnormality. Electronically Signed   By: Charlett Nose M.D.   On: 10/08/2017 23:21    Procedures Procedures (including critical care time)  Medications Ordered in ED Medications  sodium chloride 0.9 % bolus 500 mL (not administered)     Initial Impression / Assessment and Plan / ED Course  I have reviewed the triage vital signs and the nursing notes.  Pertinent labs & imaging results that were available during my care of the patient were reviewed by me and considered in my medical decision making (see chart for details).     Patient will need admission for possible stroke workup and syncope along with the rhabdo with a CK of almost 4000. patient laid on the ground for greater than 24 hours.  Spoke with neurology who recommends an MRI and if there is any acute abnormality to have a hospitalist consult him at that time.  Patient is advised the plan and all questions were answered.  Final Clinical Impressions(s) / ED Diagnoses   Final diagnoses:  None    ED Discharge Orders    None       Kyra Manges 10/09/17 0010    Tilden Fossa, MD 10/11/17 [id number]

## 2017-10-09 NOTE — ED Notes (Signed)
Patient eating breakfast. C collar removed per Dr. Estell HarpinZammit.

## 2017-10-09 NOTE — Progress Notes (Signed)
EEG completed, results pending. 

## 2017-10-09 NOTE — Evaluation (Signed)
Physical Therapy Evaluation Patient Details Name: Samuel Vega MRN: [id number] DOB: 06/08/1924 Today's Date: 10/09/2017   History of Present Illness  Samuel NorwayWilliam T Patmon is a 84 y.o. male with history of severe aortic stenosis, PTSD presently not on any medications was brought to the ER after patient was found on the floor, presenting with right parietal stroke and MI.  Clinical Impression  Patient presents with decreased independence with mobility due to L weakness, decreased awareness, decreased safety, decreased activity tolerance and decreased balance.  He was living independent prior to admission, but currently mod A with high fall risk.  He will benefit from skilled PT in the acute setting prior to d/c to SNF level rehab.    Follow Up Recommendations SNF;Supervision/Assistance - 24 hour    Equipment Recommendations  Rolling walker with 5" wheels    Recommendations for Other Services       Precautions / Restrictions Precautions Precautions: Fall      Mobility  Bed Mobility Overal bed mobility: Needs Assistance Bed Mobility: Supine to Sit     Supine to sit: Mod assist        Transfers Overall transfer level: Needs assistance Equipment used: Rolling walker (2 wheeled) Transfers: Sit to/from [employer]Stand;Stand Pivot Transfers Sit to Stand: Mod assist Stand pivot transfers: Mod assist       General transfer comment: max cues for safety/focus, hand placement (assist to transfer L hand from chair to walker), for managing L LE due to ataxia  Ambulation/Gait                Stairs            Wheelchair Mobility    Modified Rankin (Stroke Patients Only) Modified Rankin (Stroke Patients Only) Pre-Morbid Rankin Score: No symptoms Modified Rankin: Moderately severe disability     Balance Overall balance assessment: Needs assistance Sitting-balance support: Feet supported Sitting balance-Leahy Scale: Poor Sitting balance - Comments: initially leaning L  and cues to reach for bed rail (footboard) for balance, [facility] and [facility] to bed able to sit with S no UE support   Standing balance support: Bilateral upper extremity supported Standing balance-Leahy Scale: Poor Standing balance comment: min A and UE support for balance                             Pertinent Vitals/Pain Pain Assessment: Faces Faces Pain Scale: Hurts even more Pain Location: generalized due to struggling on the floor Pain Descriptors / Indicators: Grimacing;Sore Pain Intervention(s): Monitored during session;Repositioned    Home Living Family/patient expects to be discharged to:: Private residence Living Arrangements: Alone   Type of Home: House(condo) Home Access: Stairs to enter Entrance Stairs-Rails: Right Entrance Stairs-Number of Steps: 3 Home Layout: One level Home Equipment: None      Prior Function Level of Independence: Independent         Comments: macular degeneration, reports drives a little, seems to have been having trouble at home taking care of himself     Hand Dominance   Dominant Hand: Right    Extremity/Trunk Assessment   Upper Extremity Assessment Upper Extremity Assessment: LUE deficits/detail LUE Deficits / Details: drift noted with shoulder elevation    Lower Extremity Assessment Lower Extremity Assessment: LLE deficits/detail LLE Deficits / Details: AAROM WFL, strength hip flexion 2-/5, knee extension 4-/5    Cervical / Trunk Assessment Cervical / Trunk Assessment: Kyphotic  Communication   [facility]  Cognition Arousal/Alertness: Awake/alert Behavior During Therapy: Restless Overall Cognitive Status: Impaired/Different from baseline Area of Impairment: Attention;Safety/judgement                   Current Attention Level: Sustained     Safety/Judgement: Decreased awareness of safety;Decreased awareness of deficits            General Comments General comments (skin  integrity, edema, etc.): brusing L side of face, L shoulder, both knees and L lower leg pt reports due to struggling to get up from floor    Exercises     Assessment/Plan    PT Assessment Patient needs continued PT services  PT Problem List Decreased strength;Decreased mobility;Decreased safety awareness;Decreased activity tolerance;Decreased balance;Decreased knowledge of use of DME;Decreased coordination       PT Treatment Interventions DME instruction;Functional mobility training;Balance training;Patient/family education;Gait training;Therapeutic activities;Neuromuscular re-education    PT Goals (Current goals can be found in the Care Plan section)  Acute Rehab PT Goals Patient Stated Goal: To get better PT Goal Formulation: With patient/family Time For Goal Achievement: 10/23/17 Potential to Achieve Goals: Fair    Frequency Min 3X/week   Barriers to discharge Decreased caregiver support      Co-evaluation               AM-PAC PT "6 Clicks" Daily Activity  Outcome Measure Difficulty turning over in bed (including adjusting bedclothes, sheets and blankets)?: Unable Difficulty moving from lying on back to sitting on the side of the bed? : Unable Difficulty sitting down on and standing up from a chair with arms (e.g., wheelchair, bedside commode, etc,.)?: Unable Help needed moving to and from a bed to chair (including a wheelchair)?: A Lot Help needed walking in hospital room?: A Lot Help needed climbing 3-5 steps with a railing? : Total 6 Click Score: 8    End of Session Equipment Utilized During Treatment: Gait belt Activity Tolerance: Patient tolerated treatment well Patient left: in bed;with call bell/phone within reach;with bed alarm set   PT Visit Diagnosis: Other abnormalities of gait and mobility (R26.89);Ataxic gait (R26.0);Other symptoms and signs involving the nervous system (R29.898);History of falling (Z91.81);Unsteadiness on feet (R26.81);Hemiplegia and  hemiparesis Hemiplegia - Right/Left: Left Hemiplegia - dominant/non-dominant: Non-dominant Hemiplegia - caused by: Cerebral infarction    Time: 9562-1308 PT Time Calculation (min) (ACUTE ONLY): 38 min   Charges:   PT Evaluation $PT Eval Moderate Complexity: 1 Mod PT Treatments $Therapeutic Activity: 23-37 mins   PT G CodesSheran Lawless, [place] [phone] 10/09/2017   Elray Mcgregor 10/09/2017, 5:18 PM

## 2017-10-09 NOTE — ED Notes (Signed)
Lunch tray ordered; regular diet 

## 2017-10-09 NOTE — Consult Note (Signed)
Cardiology Consultation Note    Patient ID: Samuel Vega, MRN: [id number], DOB/AGE: Sep 03, 1923 84 y.o. Admit date: 10/08/2017   Date of Consult: 10/09/2017 Primary Physician: Porfirio Oar, PA-C Primary Cardiologist: Dr. Katrinka Blazing  Reason for Consultation: Troponin elevation in setting of subacute stroke Requesting MD: Dr. Toniann Fail  HPI: Samuel Vega is a 84 y.o. male with a history of severe aortic stenosis, hyperlipidemia with statin intolerance, who presents after being found down.  The patient is somewhat confused and tangential upon interview, so the majority of the history has been obtained from chart review.  Patient was watching TV at approximately 10 mm on the day prior to admission, when he felt weakness in his left arm and progressive confusion.  He tried to stand up and fell to the floor, and was down for approximately 12 hours.  The police and his family members entered his house and found him.  EMS was called and he was taken to the [facility] ED for further evaluation.  In the emergency department, he had an MRI of the brain which showed likely subacute right parietal infarct of moderate size.  His labs showed a troponin of 1.7, creatinine of 1.0, CK of nearly 4000.  His ECG showed new right bundle branch block, with significant artifact, but no acute ischemic changes.  Upon my interview, the patient denies any chest pain or shortness of breath before or after the event.  He denies any recent heart failure symptoms, anginal symptoms, or syncopal episodes prior to yesterday's event.  He has no chest pain or shortness of breath currently.  The hospitalist team was able to elicit a history of some chest discomfort which the patient attributed to falling on the concrete floor, however he denied this upon my interview.  Past Medical History:  Diagnosis Date  . Anxiety   . Cardiac murmur   . Chronic back pain   . ED (erectile dysfunction)   . Hyperlipidemia   . Macular  degeneration   . PTSD (post-traumatic stress disorder)   . 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 ELBOW SURGERY    . EYE SURGERY       Home Meds: Prior to Admission medications   Medication Sig Start Date End Date Taking? Authorizing Provider  ALPRAZolam (XANAX) 0.25 MG tablet TAKE ONE TABLET BY MOUTH THREE TIMES A DAY AS NEEDED FOR ANXIETY 04/02/17   Jeffery, Chelle, PA-C  mirtazapine (REMERON) 7.5 MG tablet Take 1 tablet (7.5 mg total) by mouth at bedtime. 08/22/17   Porfirio Oar, PA-C  PRESCRIPTION MEDICATION Vitamin for eyes taking    [provider]  sildenafil (VIAGRA) 100 MG tablet Take 0.5-1 tablets (50-100 mg total) by mouth daily as needed for erectile dysfunction. 07/04/13   Porfirio Oar, PA-C    Inpatient Medications:  .  stroke: mapping our early stages of recovery book   Does not apply Once  . aspirin  300 mg Rectal Daily   Or  . aspirin  325 mg Oral Daily  . enoxaparin (LOVENOX) injection  40 mg Subcutaneous Q24H   . sodium chloride      Allergies:  Allergies  Allergen Reactions  . Statins     MYALGIAS    Social History   Socioeconomic History  . Marital status: Divorced    Spouse name: n/a  .  Number of children: 2  . Years of education: Not on file  . Highest education level: Not on file  Social Needs  . Financial resource strain: Not on file  . Food insecurity - worry: Not on file  . Food insecurity - inability: Not on file  . Transportation needs - medical: Not on file  . Transportation needs - non-medical: Not on file  Occupational History  . Occupation: retired  Tobacco Use  . Smoking status: Never Smoker  . Smokeless tobacco: Never Used  Substance and Sexual Activity  . Alcohol use: No    Comment: none since 1980  . Drug use: No  . Sexual activity: Yes    Partners: Female  Other Topics Concern  . Not on  file  Social History Narrative   Divorced in [place]1971. 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  His grandson is a Optometristpediatrician.     Family History  Problem Relation Age of Onset  . Hypertension Other      Review of Systems: All other systems reviewed and are otherwise negative except as noted above.  Labs: Recent Labs    10/08/17 2226 10/08/17 2315  CKTOTAL 3,971*  --   TROPONINI  --  1.71*   Lab Results  Component Value Date   WBC 13.6 (H) 10/09/2017   HGB 12.9 (L) 10/09/2017   HCT 39.4 10/09/2017   MCV 83.5 10/09/2017   PLT 177 10/09/2017    Recent Labs  Lab 10/08/17 2226  NA 144  K 4.1  CL 108  CO2 22  BUN 25*  CREATININE 1.01  CALCIUM 9.3  PROT 7.2  BILITOT 1.1  ALKPHOS 77  ALT 45  AST 169*  GLUCOSE 117*   No results found for: CHOL, HDL, LDLCALC, TRIG No results found for: DDIMER  Radiology/Studies:  Dg Chest 2 View  Result Date: 10/08/2017 CLINICAL DATA:  Fall EXAM: CHEST  2 VIEW COMPARISON:  None. FINDINGS: Mild cardiomegaly. Bibasilar atelectasis. No effusions. No edema. No acute bony abnormality. IMPRESSION: Mild cardiomegaly.  Bibasilar atelectasis. Electronically Signed   By: Charlett NoseKevin  Dover M.D.   On: 10/08/2017 23:17   Dg Pelvis 1-2 Views  Result Date: 10/09/2017 CLINICAL DATA:  84 year old male with fall and left hip pain. EXAM: PELVIS - 1-2 VIEW COMPARISON:  None. FINDINGS: There is no acute fracture or dislocation. The bones are osteopenic. Mild arthritic changes. There is degenerative changes of the visualized lower lumbar spine. The soft tissues appear unremarkable. Vascular calcification noted. IMPRESSION: No acute fracture or dislocation. Electronically Signed   By: Elgie CollardArash  Radparvar M.D.   On: 10/09/2017 05:33   Dg Elbow 2 Views Left  Result Date: 10/09/2017 CLINICAL DATA:  84 year old male with fall and left elbow pain. EXAM: LEFT ELBOW - 2 VIEW COMPARISON:  None. FINDINGS: There is no acute fracture or dislocation. Mild osteopenia. No arthritic changes. The soft tissues  appear unremarkable. No joint effusion. IMPRESSION: Negative. Electronically Signed   By: Elgie CollardArash  Radparvar M.D.   On: 10/09/2017 05:34   Ct Head Wo Contrast  Result Date: 10/08/2017 CLINICAL DATA:  Found on floor.  Bruising to left side of face EXAM: CT HEAD WITHOUT CONTRAST CT CERVICAL SPINE WITHOUT CONTRAST TECHNIQUE: Multidetector CT imaging of the head and cervical spine was performed following the standard protocol without intravenous contrast. Multiplanar CT image reconstructions of the cervical spine were also generated. COMPARISON:  None. FINDINGS: CT HEAD FINDINGS Brain: Area of low-density noted in the posteromedial right parietal lobe concerning for acute infarct. There is atrophy  and chronic small vessel disease changes. No hemorrhage or hydrocephalus. Vascular: No hyperdense vessel or unexpected calcification. Skull: No acute calvarial abnormality. Sinuses/Orbits: No acute findings Other: None CT CERVICAL SPINE FINDINGS Alignment: No subluxation. Skull base and vertebrae: No fracture Soft tissues and spinal canal: No prevertebral fluid or swelling. No visible canal hematoma. Disc levels:  Diffuse degenerative disc and facet disease. Upper chest: No acute findings Other: No acute findings IMPRESSION: Subtle low-density in the posteromedial high right parietal lobe concerning for acute infarction. No hemorrhage. Diffuse degenerative disc and facet disease in the cervical spine. No acute bony abnormality. Electronically Signed   By: Charlett Nose M.D.   On: 10/08/2017 23:33   Ct Cervical Spine Wo Contrast  Result Date: 10/08/2017 CLINICAL DATA:  Found on floor.  Bruising to left side of face EXAM: CT HEAD WITHOUT CONTRAST CT CERVICAL SPINE WITHOUT CONTRAST TECHNIQUE: Multidetector CT imaging of the head and cervical spine was performed following the standard protocol without intravenous contrast. Multiplanar CT image reconstructions of the cervical spine were also generated. COMPARISON:  None.  FINDINGS: CT HEAD FINDINGS Brain: Area of low-density noted in the posteromedial right parietal lobe concerning for acute infarct. There is atrophy and chronic small vessel disease changes. No hemorrhage or hydrocephalus. Vascular: No hyperdense vessel or unexpected calcification. Skull: No acute calvarial abnormality. Sinuses/Orbits: No acute findings Other: None CT CERVICAL SPINE FINDINGS Alignment: No subluxation. Skull base and vertebrae: No fracture Soft tissues and spinal canal: No prevertebral fluid or swelling. No visible canal hematoma. Disc levels:  Diffuse degenerative disc and facet disease. Upper chest: No acute findings Other: No acute findings IMPRESSION: Subtle low-density in the posteromedial high right parietal lobe concerning for acute infarction. No hemorrhage. Diffuse degenerative disc and facet disease in the cervical spine. No acute bony abnormality. Electronically Signed   By: Charlett Nose M.D.   On: 10/08/2017 23:33   Mr Brain Wo Contrast  Result Date: 10/09/2017 CLINICAL DATA:  84 y/o  M; possible syncope. EXAM: MRI HEAD WITHOUT CONTRAST TECHNIQUE: Multiplanar, multiecho pulse sequences of the brain and surrounding structures were obtained without intravenous contrast. COMPARISON:  10/08/2017 CT head. FINDINGS: Brain: 3.8 x 3.9 x 4.6 cm (volume = 36 cm^3) region of predominantly cortical diffusion hyperintensity which demonstrates both areas of reduced, intermediate, and increased diffusion on ADC (see series 3, image 38 and series 350, image 38). The region is associated with increased T2 FLAIR hyperintense signal abnormality and minimal gyral swelling. No focus of reduced diffusion to suggest acute or early subacute infarction. No abnormal susceptibility hypointensity to indicate intracranial hemorrhage. Severalnonspecific foci of T2 FLAIR hyperintense signal abnormality in subcortical and periventricular white matter are compatible withmildchronic microvascular ischemic changes for  age. Mildbrain parenchymal volume loss. There is displacement of cortical veins away from the calvarium over the right cerebral cortex with CSF signal (series 10, image 15 and series 6, image 16) which may represent a small hygroma. No midline shift. Vascular: Normal flow voids. Skull and upper cervical spine: Normal marrow signal. Sinuses/Orbits: Negative. Other: None. IMPRESSION: 1. Right superior and medial parietal lobe 36 cc area of predominantly cortical diffusion signal abnormality demonstrating areas of both reduced and increased diffusion. Findings probably represents a subacute infarction, but cerebritis or seizure activity should also be considered. No hemorrhage or mass effect. 2. Mild chronic microvascular ischemic changes and mild parenchymal volume loss of the brain. 3. Displacement of cortical veins over the right cerebral convexity from the calvarium, possibly small hygroma. No significant  mass effect on the brain. Electronically Signed   By: Mitzi Hansen M.D.   On: 10/09/2017 04:03   Dg Hand Complete Left  Result Date: 10/08/2017 CLINICAL DATA:  Fall.  Right hand pain EXAM: LEFT HAND - COMPLETE 3+ VIEW COMPARISON:  None. FINDINGS: No acute bony abnormality. Specifically, no fracture, subluxation, or dislocation. IMPRESSION: No acute bony abnormality. Electronically Signed   By: Charlett Nose M.D.   On: 10/08/2017 23:21   Dg Femur Min 2 Views Left  Result Date: 10/09/2017 CLINICAL DATA:  84 year old male with fall and left hip pain. EXAM: LEFT FEMUR 2 VIEWS COMPARISON:  Pelvic radiograph dated 10/09/2017 FINDINGS: There is no acute fracture or dislocation. Mild osteopenia. No significant arthritic changes. Vascular calcifications noted. The soft tissues appear unremarkable. IMPRESSION: Negative. Electronically Signed   By: Elgie Collard M.D.   On: 10/09/2017 05:36    Wt Readings from Last 3 Encounters:  10/08/17 81.6 kg (180 lb)  08/18/17 83.2 kg (183 lb 6.4 oz)  04/01/17  84.9 kg (187 lb 3.2 oz)    EKG: Normal sinus rhythm with occasional PACs, significant artifact, right bundle branch block.  No acute ischemic changes  Physical Exam: Blood pressure (!) 125/57, pulse 66, temperature 98.1 F (36.7 C), temperature source Axillary, resp. rate 14, height 5\' 8"  (1.727 m), weight 81.6 kg (180 lb), SpO2 100 %. Body mass index is 27.37 kg/m. General: Well developed, well nourished, in no acute distress. Head: Significant bruising over left eye. Neck: Patient's neck is immobilized in a C-spine collar. Lungs: Clear bilaterally to auscultation without wheezes, rales, or rhonchi. Breathing is unlabored. Heart: RRR with normal S1, diminished but audible S2, harsh, late peaking ejection murmur heard throughout the precordium.  No rubs or gallops Abdomen: Soft, non-tender, non-distended with normoactive bowel sounds. No hepatomegaly. No rebound/guarding. No obvious abdominal masses. Msk:  Strength and tone appear normal for age. Extremities: No clubbing or cyanosis. No edema.  Distal pedal pulses are 2+ and equal bilaterally. Neuro: Alert and oriented X 3. No facial asymmetry. No focal deficit. Moves all extremities spontaneously. Psych:  Responds to questions appropriately with a normal affect.     Assessment and Plan  84 year old man with severe aortic stenosis, who presents with subacute right parietal infarct.  His troponin is elevated, and this is more than likely reflects demand ischemia in the setting of his stroke and his known severe aortic stenosis.  He has no complaints of chest pain or shortness of breath before after this event.  His EKG shows a new right bundle branch block, but otherwise no significant ischemic changes.  In discussion with the hospitalist team, neurology is concerned about starting heparin given the size of the parietal infarct.  As such, given a lower likelihood of acute coronary plaque rupture leading to the troponin elevation, it is  reasonable to hold off on heparinization for now.  Plan to cycle cardiac biomarkers and perform an echo in the morning.  If the cardiac biomarkers are dramatically rising or the echo shows new regional wall motion abnormalities (last echo in 2015 showed normal wall motion), it would be reasonable to start heparin without a bolus.  Cardiology will continue to follow along.  Signed, Esmond Plants, MD 10/09/2017, 5:45 AM
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
## 2017-10-09 NOTE — ED Notes (Signed)
Pt transported for MR MRA.

## 2017-10-09 NOTE — ED Notes (Signed)
Patient states he has 2 hearing aids however the night RN reported that the patient only had 1 hearing aid with him and that was the right and that was also confirmed by son.
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
## 2017-10-09 NOTE — Progress Notes (Signed)
84 y/o presenting with right parietal stroke and MI. Cardiology and Neurology consulted.   Will reassess next am. Patient undergoing investigation for stroke and MI.   Further recommendations to come from specialist on board. Per last cardiology note at 1300 no anticipated cardiac intervention due to recent stroke.   [employer] Communicationsrlando Jarrad Mclees

## 2017-10-09 NOTE — H&P (Addendum)
History and Physical    Samuel Vega NFA:[id number] DOB: August 11, 1924 DOA: 10/08/2017  PCP: Porfirio Oar, PA-C  Patient coming from: Home.  Chief Complaint: Fall.  HPI: Samuel Vega is a 84 y.o. male with history of severe aortic stenosis, PTSD presently not on any medications was brought to the ER after patient was found on the floor.  Patient states at around 10 AM yesterday morning while watching television he felt something funny about his left arm going limp and he was getting more confused.  He stood up and try to walk when he fell onto the floor.  Following which she states he may have lost consciousness.  When he was awake he was trying to crawl on the floor to get help he had some chest pain which he attributes to the fall.  Later in the evening police had to break into his house along with his family members and patient was found on the floor initially confused.  Patient was brought to the ER.  There was also concerns that patient's house was broken in.  ED Course: In the ER patient's troponin was 1.7 with EKG showing sinus rhythm with RBBB.  Patient had not complained of any chest pain.  CT of the head and C-spine were done which shows features concerning for stroke.  On-call neurologist has been consulted and had requested MRI brain.  MRI brain shows diffusion changes concerning for stroke versus seizures.  Neurology has been reconsulted.  Cardiology consult has been requested for the elevated troponin.  On my exam patient has multiple bruises including on the face particular on the left periorbital area on the extremities.  Patient is having weakness on the left extremity particular on the left lower extremity which he has only 2 x 5 strength.  Patient attributes her weakness to his fall and pain.  Review of Systems: As per HPI, rest all negative.   Past Medical History:  Diagnosis Date  . Anxiety   . Cardiac murmur   . Chronic back pain   . ED (erectile dysfunction)    . Hyperlipidemia   . Macular degeneration   . PTSD (post-traumatic stress disorder)   . 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 ELBOW SURGERY    . EYE SURGERY       reports that  has never smoked. he has never used smokeless tobacco. He reports that he does not drink alcohol or use drugs.  Allergies  Allergen Reactions  . Statins     MYALGIAS    Family History  Problem Relation Age of Onset  . Hypertension Other     Prior to Admission medications   Medication Sig Start Date End Date Taking? Authorizing Provider  ALPRAZolam (XANAX) 0.25 MG tablet TAKE ONE TABLET BY MOUTH THREE TIMES A DAY AS NEEDED FOR ANXIETY 04/02/17   Jeffery, Chelle, PA-C  mirtazapine (REMERON) 7.5 MG tablet Take 1 tablet (7.5 mg total) by mouth at bedtime. 08/22/17   Porfirio Oar, PA-C  PRESCRIPTION MEDICATION Vitamin for eyes taking    [provider]  sildenafil (VIAGRA) 100 MG tablet Take 0.5-1 tablets (50-100 mg total) by mouth daily as needed for erectile dysfunction. 07/04/13   Porfirio Oar, PA-C    Physical Exam: Vitals:   10/09/17 0030 10/09/17 0130 10/09/17 0200 10/09/17 0400  BP: (!) 143/75 (!) 133/56 (!) 122/47 (!) 125/57  Pulse:  91 63 66  Resp:  19 16 14   Temp:      TempSrc:      SpO2: 99% 100% 100% 100%  Weight:      Height:          Constitutional: Moderately built and nourished. Vitals:   10/09/17 0030 10/09/17 0130 10/09/17 0200 10/09/17 0400  BP: (!) 143/75 (!) 133/56 (!) 122/47 (!) 125/57  Pulse:  91 63 66  Resp:  19 16 14   Temp:      TempSrc:      SpO2: 99% 100% 100% 100%  Weight:      Height:       Eyes: Anicteric no pallor.  Left periorbital hematoma. ENMT: Multiple ecchymotic areas.  No discharge. Neck: Neck is in a collar.   Respiratory: No rhonchi or crepitations. Cardiovascular: S1-S2 heard systolic murmur appreciated. Abdomen: Soft nontender  bowel sounds present. Musculoskeletal: Multiple ecchymotic areas all extremities more on the left side. Skin: Multiple ecchymotic areas. Neurologic: Alert awake oriented to time place and person.  Has some slurring of speech.  Has weakness on the left upper and lower extremity left upper extremity is around 4 x 5 in left lower extremities 2 x 5.  Able to move right upper and lower extremity almost 5 x 5.  No facial asymmetry tongue is midline.  Pupils are reacting to light. Psychiatric: Appears normal.  Normal affect.   Labs on Admission: I have personally reviewed following labs and imaging studies  CBC: Recent Labs  Lab 10/08/17 2226  WBC 15.9*  NEUTROABS 13.7*  HGB 14.0  HCT 42.9  MCV 83.8  PLT 180   Basic Metabolic Panel: Recent Labs  Lab 10/08/17 2226  NA 144  K 4.1  CL 108  CO2 22  GLUCOSE 117*  BUN 25*  CREATININE 1.01  CALCIUM 9.3   GFR: Estimated Creatinine Clearance: 46.1 mL/min (by C-G formula based on SCr of 1.01 mg/dL). Liver Function Tests: Recent Labs  Lab 10/08/17 2226  AST 169*  ALT 45  ALKPHOS 77  BILITOT 1.1  PROT 7.2  ALBUMIN 3.5   No results for input(s): LIPASE, AMYLASE in the last 168 hours. No results for input(s): AMMONIA in the last 168 hours. Coagulation Profile: No results for input(s): INR, PROTIME in the last 168 hours. Cardiac Enzymes: Recent Labs  Lab 10/08/17 2226 10/08/17 2315  CKTOTAL 3,971*  --   TROPONINI  --  1.71*   BNP (last 3 results) No results for input(s): PROBNP in the last 8760 hours. HbA1C: No results for input(s): HGBA1C in the last 72 hours. CBG: No results for input(s): GLUCAP in the last 168 hours. Lipid Profile: No results for input(s): CHOL, HDL, LDLCALC, TRIG, CHOLHDL, LDLDIRECT in the last 72 hours. Thyroid Function Tests: No results for input(s): TSH, T4TOTAL, FREET4, T3FREE, THYROIDAB in the last 72 hours. Anemia Panel: No results for input(s): VITAMINB12, FOLATE, FERRITIN, TIBC, IRON,  RETICCTPCT in the last 72 hours. Urine analysis: No results found for: COLORURINE, APPEARANCEUR, LABSPEC, PHURINE, GLUCOSEU, HGBUR, BILIRUBINUR, KETONESUR, PROTEINUR, UROBILINOGEN, NITRITE, LEUKOCYTESUR Sepsis Labs: @LABRCNTIP (procalcitonin:4,lacticidven:4) )No results found for this or any previous visit (from the past 240 hour(s)).   Radiological Exams on Admission: Dg Chest 2 View  Result Date: 10/08/2017 CLINICAL DATA:  Fall EXAM: CHEST  2 VIEW COMPARISON:  None. FINDINGS: Mild cardiomegaly. Bibasilar atelectasis. No effusions. No edema. No acute bony abnormality. IMPRESSION: Mild cardiomegaly.  Bibasilar atelectasis. Electronically Signed   By: Charlett Nose M.D.  On: 10/08/2017 23:17   Ct Head Wo Contrast  Result Date: 10/08/2017 CLINICAL DATA:  Found on floor.  Bruising to left side of face EXAM: CT HEAD WITHOUT CONTRAST CT CERVICAL SPINE WITHOUT CONTRAST TECHNIQUE: Multidetector CT imaging of the head and cervical spine was performed following the standard protocol without intravenous contrast. Multiplanar CT image reconstructions of the cervical spine were also generated. COMPARISON:  None. FINDINGS: CT HEAD FINDINGS Brain: Area of low-density noted in the posteromedial right parietal lobe concerning for acute infarct. There is atrophy and chronic small vessel disease changes. No hemorrhage or hydrocephalus. Vascular: No hyperdense vessel or unexpected calcification. Skull: No acute calvarial abnormality. Sinuses/Orbits: No acute findings Other: None CT CERVICAL SPINE FINDINGS Alignment: No subluxation. Skull base and vertebrae: No fracture Soft tissues and spinal canal: No prevertebral fluid or swelling. No visible canal hematoma. Disc levels:  Diffuse degenerative disc and facet disease. Upper chest: No acute findings Other: No acute findings IMPRESSION: Subtle low-density in the posteromedial high right parietal lobe concerning for acute infarction. No hemorrhage. Diffuse degenerative disc  and facet disease in the cervical spine. No acute bony abnormality. Electronically Signed   By: Charlett Nose M.D.   On: 10/08/2017 23:33   Ct Cervical Spine Wo Contrast  Result Date: 10/08/2017 CLINICAL DATA:  Found on floor.  Bruising to left side of face EXAM: CT HEAD WITHOUT CONTRAST CT CERVICAL SPINE WITHOUT CONTRAST TECHNIQUE: Multidetector CT imaging of the head and cervical spine was performed following the standard protocol without intravenous contrast. Multiplanar CT image reconstructions of the cervical spine were also generated. COMPARISON:  None. FINDINGS: CT HEAD FINDINGS Brain: Area of low-density noted in the posteromedial right parietal lobe concerning for acute infarct. There is atrophy and chronic small vessel disease changes. No hemorrhage or hydrocephalus. Vascular: No hyperdense vessel or unexpected calcification. Skull: No acute calvarial abnormality. Sinuses/Orbits: No acute findings Other: None CT CERVICAL SPINE FINDINGS Alignment: No subluxation. Skull base and vertebrae: No fracture Soft tissues and spinal canal: No prevertebral fluid or swelling. No visible canal hematoma. Disc levels:  Diffuse degenerative disc and facet disease. Upper chest: No acute findings Other: No acute findings IMPRESSION: Subtle low-density in the posteromedial high right parietal lobe concerning for acute infarction. No hemorrhage. Diffuse degenerative disc and facet disease in the cervical spine. No acute bony abnormality. Electronically Signed   By: Charlett Nose M.D.   On: 10/08/2017 23:33   Mr Brain Wo Contrast  Result Date: 10/09/2017 CLINICAL DATA:  84 y/o  M; possible syncope. EXAM: MRI HEAD WITHOUT CONTRAST TECHNIQUE: Multiplanar, multiecho pulse sequences of the brain and surrounding structures were obtained without intravenous contrast. COMPARISON:  10/08/2017 CT head. FINDINGS: Brain: 3.8 x 3.9 x 4.6 cm (volume = 36 cm^3) region of predominantly cortical diffusion hyperintensity which  demonstrates both areas of reduced, intermediate, and increased diffusion on ADC (see series 3, image 38 and series 350, image 38). The region is associated with increased T2 FLAIR hyperintense signal abnormality and minimal gyral swelling. No focus of reduced diffusion to suggest acute or early subacute infarction. No abnormal susceptibility hypointensity to indicate intracranial hemorrhage. Severalnonspecific foci of T2 FLAIR hyperintense signal abnormality in subcortical and periventricular white matter are compatible withmildchronic microvascular ischemic changes for age. Mildbrain parenchymal volume loss. There is displacement of cortical veins away from the calvarium over the right cerebral cortex with CSF signal (series 10, image 15 and series 6, image 16) which may represent a small hygroma. No midline shift. Vascular: Normal  flow voids. Skull and upper cervical spine: Normal marrow signal. Sinuses/Orbits: Negative. Other: None. IMPRESSION: 1. Right superior and medial parietal lobe 36 cc area of predominantly cortical diffusion signal abnormality demonstrating areas of both reduced and increased diffusion. Findings probably represents a subacute infarction, but cerebritis or seizure activity should also be considered. No hemorrhage or mass effect. 2. Mild chronic microvascular ischemic changes and mild parenchymal volume loss of the brain. 3. Displacement of cortical veins over the right cerebral convexity from the calvarium, possibly small hygroma. No significant mass effect on the brain. Electronically Signed   By: Mitzi HansenLance  Furusawa-Stratton M.D.   On: 10/09/2017 04:03   Dg Hand Complete Left  Result Date: 10/08/2017 CLINICAL DATA:  Fall.  Right hand pain EXAM: LEFT HAND - COMPLETE 3+ VIEW COMPARISON:  None. FINDINGS: No acute bony abnormality. Specifically, no fracture, subluxation, or dislocation. IMPRESSION: No acute bony abnormality. Electronically Signed   By: Charlett NoseKevin  Dover M.D.   On: 10/08/2017  23:21    EKG: Independently reviewed.  Normal sinus rhythm with RBBB and discussed with cardiologist about the EKG findings.  Assessment/Plan Principal Problem:   Acute CVA (cerebrovascular accident) [facility](HCC) Active Problems:   Aortic stenosis   PTSD (post-traumatic stress disorder)   Elevated troponin   Rhabdomyolysis    1. Acute CVA -discussed with Dr. Otelia LimesLindzen neurologist who at this time feels the MRI findings are concerning for acute CVA.  Patient is placed on aspirin.  Not on statin since patient has allergies to statins and also CK levels are elevated.  Patient has passed swallow evaluation already.  Will be getting MRA of the brain 2D echo carotid Doppler physical therapy consult check hemoglobin A1c and lipid panel. 2. Non-ST elevation MI/elevated troponin -discussed with cardiology Dr. Allena Katzhakravartti.  Cardiologist will be evaluating patient.  Patient is on aspirin.  Not on statin since patient is allergic to statins and also has rhabdomyolysis.  Patient's heart rate is around 60 at this time.  Will place on beta-blockers based on heart rate if it worsens.  We will cycle cardiac markers.  After evaluation cardiology would decide if patient needs to be on heparin infusion based on further findings and also in discussion with neurologist given the acute stroke. 3. Rhabdomyolysis secondary to fall -we will gently hydrate since patient has severe aortic stenosis.  Closely follow respiratory status.  Follow CK levels. 4. Severe aortic stenosis per 2D echo in 2015 -follow 2D echo results. 5. History of PTSD. 6. Elevated AST likely from rhabdomyolysis.  Repeat LFTs have been ordered.   DVT prophylaxis: Lovenox. Code Status: Full code. Family Communication: No family at the bedside. Disposition Plan: To be determined. Consults called: Neurology and cardiology. Admission status: Inpatient.   Eduard ClosArshad N Tita Terhaar MD [facility] Pager [phone].  If 7PM-7AM, please contact  night-coverage [email or web address] Password [email or web address]  10/09/2017, 4:32 AM

## 2017-10-09 NOTE — ED Notes (Signed)
Date and time results received: 10/09/17  (use smartphrase ".now" to insert current time)  Test: trop 1.71

## 2017-10-09 NOTE — ED Notes (Signed)
Pt transported to EEG 

## 2017-10-10 ENCOUNTER — Inpatient Hospital Stay (HOSPITAL_COMMUNITY): Payer: PPO

## 2017-10-10 ENCOUNTER — Other Ambulatory Visit (HOSPITAL_COMMUNITY): Payer: PPO

## 2017-10-10 DIAGNOSIS — I6521 Occlusion and stenosis of right carotid artery: Secondary | ICD-10-CM

## 2017-10-10 LAB — ECHOCARDIOGRAM COMPLETE
Height: 68 in
Weight: 2880 oz

## 2017-10-10 NOTE — Progress Notes (Signed)
PROGRESS NOTE    Samuel Vega  ZOX:[id number] DOB: 08-18-1923 DOA: 10/08/2017 PCP: Porfirio Oar, PA-C    Brief Narrative:  84 y.o. male with history of severe aortic stenosis, PTSD presently not on any medications was brought to the ER after patient was found on the floor, presenting with right parietal stroke and MI.  Per cardiology they do not anticipate any cardiac intervention given recent stroke. Neurology on board and have requested vascular surgery consult which I did this a.m.   Assessment & Plan:   Principal Problem:   Cerebrovascular accident (CVA) due to embolism of right anterior cerebral artery (HCC)  - Severe RICA stenosis vs cardioembolic: Patient presented with left-sided deficits - Holding statin secondary to elevated LFTs plan is to start statin on discharge once LFTs normalize  Active Problems:   Aortic stenosis   PTSD (post-traumatic stress disorder) -Stable    Elevated troponin - pt is s/p MI, unfortunately pt had stroke as well which limits cardiology's ability to perform any interventions. Medical management at this point  Rhabdomyolysis -Will obtain CK levels next a.m. In lieu of recent MI do not want to be aggressive with fluids as I would not like to fluid overload the patient    Syncope - Most likely secondary to principal problems stroke/MI    Carotid stenosis, right - Patient found to have right ICA stenosis 70-80% on CTA - Consulted vascular surgeon and plan is to reevaluate in one month - No operation plan at this moment and plan is to optimize medical management.   DVT prophylaxis: Lovenox Code Status: Full Family Communication: none at bedside. Neurologist updated family per his notes. Disposition Plan: once cleared for d/c by neurology and vascular surgery   Consultants:   Vascular surgery  Cardiology  Neurology  Procedures: BP: (!) 137/98 (!) 108/52 (!) 137/54 115/86  Pulse: 83 (!) 57 62 80  Resp: 16 18 17 17   Temp:  97.9 F (36.6 C) 97.9 F (36.6 C) 97.6 F (36.4 C) (!) 97.5 F (36.4 C)  TempSrc: Oral Oral Oral Oral  SpO2: (!) 78% 100% 99% 97%  Weight:      Height:       No intake or output data in the 24 hours ending 10/10/17 1319 Filed Weights   10/08/17 2026  Weight: 81.6 kg (180 lb)    Examination:  General exam: Appears calm and comfortable, in nad. Respiratory system: Clear to auscultation. Respiratory effort normal. Cardiovascular system: S1 & S2 heard, RRR. No JVD, murmurs, rubs, gallops  Gastrointestinal system: Abdomen is nondistended, soft and nontender. No organomegaly or masses felt. Normal bowel sounds heard. Central nervous system: Alert and oriented. No focal neurological deficits. Extremities: Left leg weakness when compared to the right, answers questions appropriatelyappropriately Skin: bruising left side of the face, no ulcers on limited exam. Psychiatry:Mood & affect appropriate.     Data Reviewed: I have personally reviewed following labs and imaging studies  CBC: Recent Labs  Lab 10/08/17 2226 10/09/17 0456  WBC 15.9* 13.6*  NEUTROABS 13.7* 10.9*  HGB 14.0 12.9*  HCT 42.9 39.4  MCV 83.8 83.5  PLT 180 177   Basic Metabolic Panel: Recent Labs  Lab 10/08/17 2226 10/09/17 0456  NA 144 143  K 4.1 4.0  CL 108 108  CO2 22 21*  GLUCOSE 117* 119*  BUN 25* 29*  CREATININE 1.01 1.02  CALCIUM 9.3 8.8*   GFR: Estimated Creatinine Clearance: 45.6 mL/min (by C-G formula based on SCr of 1.02  mg/dL). Liver Function Tests: Recent Labs  Lab 10/08/17 2226 10/09/17 0456  AST 169* 157*  ALT 45 43  ALKPHOS 77 69  BILITOT 1.1 0.9  PROT 7.2 6.2*  ALBUMIN 3.5 3.2*   No results for input(s): LIPASE, AMYLASE in the last 168 hours. No results for input(s): AMMONIA in the last 168 hours. Coagulation Profile: No results for input(s): INR, PROTIME in the last 168 hours. Cardiac Enzymes: Recent Labs  Lab 10/08/17 2226 10/08/17 2315 10/09/17 0456  10/09/17 1032 10/09/17 1538  CKTOTAL 3,971*  --  4,000*  --   --   TROPONINI  --  1.71* 1.71* 1.34* 1.22*   BNP (last 3 results) No results for input(s): PROBNP in the last 8760 hours. HbA1C: Recent Labs    10/09/17 0456  HGBA1C 5.2   CBG: No results for input(s): GLUCAP in the last 168 hours. Lipid Profile: Recent Labs    10/09/17 0456  CHOL 235*  HDL 96  LDLCALC 122*  TRIG 87  CHOLHDL 2.4   Thyroid Function Tests: No results for input(s): TSH, T4TOTAL, FREET4, T3FREE, THYROIDAB in the last 72 hours. Anemia Panel: No results for input(s): VITAMINB12, FOLATE, FERRITIN, TIBC, IRON, RETICCTPCT in the last 72 hours. Sepsis Labs: No results for input(s): PROCALCITON, LATICACIDVEN in the last 168 hours.  No results found for this or any previous visit (from the past 240 hour(s)).       Radiology Studies: Ct Angio Head W Or Wo Contrast  Result Date: 10/09/2017 CLINICAL DATA:  Stroke workup.  Patient found on floor. EXAM: CT ANGIOGRAPHY HEAD AND NECK TECHNIQUE: Multidetector CT imaging of the head and neck was performed using the standard protocol during bolus administration of intravenous contrast. Multiplanar CT image reconstructions and MIPs were obtained to evaluate the vascular anatomy. Carotid stenosis measurements (when applicable) are obtained utilizing NASCET criteria, using the distal internal carotid diameter as the denominator. CONTRAST:  50mL ISOVUE-370 IOPAMIDOL (ISOVUE-370) INJECTION 76% COMPARISON:  Brain MRI from earlier today. FINDINGS: CT HEAD FINDINGS Brain: Right parietal low-density as characterized by MRI earlier today. No evidence of progression. No hemorrhage or hydrocephalus. Brain volume is excellent for age. There is a small right frontal hygroma without significant mass effect. Vascular: See below Skull: Questionable scalp swelling along the left parietal bone posteriorly. Negative for fracture. Sinuses: No acute finding.  Anterior nasal septum  perforation. Orbits: Bilateral cataract resection. Review of the MIP images confirms the above findings CTA NECK FINDINGS Aortic arch: Mild atheromatous plaque.  Three vessel branching. Right carotid system: Advanced calcified and noncalcified plaque at the common carotid bifurcation and ICA bulb with 70-80% stenosis. No dissection or beading. Left carotid system: Moderate calcified and noncalcified plaque at the common carotid bifurcation without ulceration or flow limiting stenosis. Vertebral arteries: Proximal subclavian mild atherosclerosis. Right dominant vertebral artery. There is mild narrowing at the left vertebral origin. No beading or dissection. Skeleton: Diffuse cervical disc degeneration with narrowing reversal of lordosis. There is upper cervical facet arthropathy with C2-3 anterolisthesis. Other neck:  No acute or aggressive finding. Upper chest: Negative Review of the MIP images confirms the above findings CTA HEAD FINDINGS Anterior circulation: Atherosclerotic plaque on the carotid siphons without flow limiting stenosis. The left ICA is smaller than the right in the setting of hypoplastic left A1 segment. No visible branch occlusion or flow limiting stenosis. Negative for aneurysm. On MIP images there is prominent vessels about the right parietal convexity. Posterior circulation: Vertebrobasilar arteries are smooth and diffusely patent. Moderate  atheromatous narrowing of the distal left P2 segment. Venous sinuses: Patent Anatomic variants: None significant Delayed phase: Mild cortical enhancement at the level of right parietal abnormality which is nonspecific but would be supportive of a subacute infarct. Review of the MIP images confirms the above findings IMPRESSION: 1. 70-80% atheromatous narrowing at the right ICA bulb. 2. No other significant stenosis in the neck. 3. Intracranial atherosclerosis without proximal or correctable stenosis. There is moderate atheromatous narrowing of the distal  left P2 segment. 4. Presumed infarct in the right parietal lobe (see brain MRI report). Mild associated enhancement would correlate with subacute timing. Electronically Signed   By: Marnee Spring M.D.   On: 10/09/2017 10:59   Dg Chest 2 View  Result Date: 10/08/2017 CLINICAL DATA:  Fall EXAM: CHEST  2 VIEW COMPARISON:  None. FINDINGS: Mild cardiomegaly. Bibasilar atelectasis. No effusions. No edema. No acute bony abnormality. IMPRESSION: Mild cardiomegaly.  Bibasilar atelectasis. Electronically Signed   By: Charlett Nose M.D.   On: 10/08/2017 23:17   Dg Pelvis 1-2 Views  Result Date: 10/09/2017 CLINICAL DATA:  84 year old male with fall and left hip pain. EXAM: PELVIS - 1-2 VIEW COMPARISON:  None. FINDINGS: There is no acute fracture or dislocation. The bones are osteopenic. Mild arthritic changes. There is degenerative changes of the visualized lower lumbar spine. The soft tissues appear unremarkable. Vascular calcification noted. IMPRESSION: No acute fracture or dislocation. Electronically Signed   By: Elgie Collard M.D.   On: 10/09/2017 05:33   Dg Elbow 2 Views Left  Result Date: 10/09/2017 CLINICAL DATA:  84 year old male with fall and left elbow pain. EXAM: LEFT ELBOW - 2 VIEW COMPARISON:  None. FINDINGS: There is no acute fracture or dislocation. Mild osteopenia. No arthritic changes. The soft tissues appear unremarkable. No joint effusion. IMPRESSION: Negative. Electronically Signed   By: Elgie Collard M.D.   On: 10/09/2017 05:34   Ct Head Wo Contrast  Result Date: 10/08/2017 CLINICAL DATA:  Found on floor.  Bruising to left side of face EXAM: CT HEAD WITHOUT CONTRAST CT CERVICAL SPINE WITHOUT CONTRAST TECHNIQUE: Multidetector CT imaging of the head and cervical spine was performed following the standard protocol without intravenous contrast. Multiplanar CT image reconstructions of the cervical spine were also generated. COMPARISON:  None. FINDINGS: CT HEAD FINDINGS Brain: Area of  low-density noted in the posteromedial right parietal lobe concerning for acute infarct. There is atrophy and chronic small vessel disease changes. No hemorrhage or hydrocephalus. Vascular: No hyperdense vessel or unexpected calcification. Skull: No acute calvarial abnormality. Sinuses/Orbits: No acute findings Other: None CT CERVICAL SPINE FINDINGS Alignment: No subluxation. Skull base and vertebrae: No fracture Soft tissues and spinal canal: No prevertebral fluid or swelling. No visible canal hematoma. Disc levels:  Diffuse degenerative disc and facet disease. Upper chest: No acute findings Other: No acute findings IMPRESSION: Subtle low-density in the posteromedial high right parietal lobe concerning for acute infarction. No hemorrhage. Diffuse degenerative disc and facet disease in the cervical spine. No acute bony abnormality. Electronically Signed   By: Charlett Nose M.D.   On: 10/08/2017 23:33   Ct Angio Neck W Or Wo Contrast  Result Date: 10/09/2017 CLINICAL DATA:  Stroke workup.  Patient found on floor. EXAM: CT ANGIOGRAPHY HEAD AND NECK TECHNIQUE: Multidetector CT imaging of the head and neck was performed using the standard protocol during bolus administration of intravenous contrast. Multiplanar CT image reconstructions and MIPs were obtained to evaluate the vascular anatomy. Carotid stenosis measurements (when applicable) are obtained  utilizing NASCET criteria, using the distal internal carotid diameter as the denominator. CONTRAST:  50mL ISOVUE-370 IOPAMIDOL (ISOVUE-370) INJECTION 76% COMPARISON:  Brain MRI from earlier today. FINDINGS: CT HEAD FINDINGS Brain: Right parietal low-density as characterized by MRI earlier today. No evidence of progression. No hemorrhage or hydrocephalus. Brain volume is excellent for age. There is a small right frontal hygroma without significant mass effect. Vascular: See below Skull: Questionable scalp swelling along the left parietal bone posteriorly. Negative for  fracture. Sinuses: No acute finding.  Anterior nasal septum perforation. Orbits: Bilateral cataract resection. Review of the MIP images confirms the above findings CTA NECK FINDINGS Aortic arch: Mild atheromatous plaque.  Three vessel branching. Right carotid system: Advanced calcified and noncalcified plaque at the common carotid bifurcation and ICA bulb with 70-80% stenosis. No dissection or beading. Left carotid system: Moderate calcified and noncalcified plaque at the common carotid bifurcation without ulceration or flow limiting stenosis. Vertebral arteries: Proximal subclavian mild atherosclerosis. Right dominant vertebral artery. There is mild narrowing at the left vertebral origin. No beading or dissection. Skeleton: Diffuse cervical disc degeneration with narrowing reversal of lordosis. There is upper cervical facet arthropathy with C2-3 anterolisthesis. Other neck:  No acute or aggressive finding. Upper chest: Negative Review of the MIP images confirms the above findings CTA HEAD FINDINGS Anterior circulation: Atherosclerotic plaque on the carotid siphons without flow limiting stenosis. The left ICA is smaller than the right in the setting of hypoplastic left A1 segment. No visible branch occlusion or flow limiting stenosis. Negative for aneurysm. On MIP images there is prominent vessels about the right parietal convexity. Posterior circulation: Vertebrobasilar arteries are smooth and diffusely patent. Moderate atheromatous narrowing of the distal left P2 segment. Venous sinuses: Patent Anatomic variants: None significant Delayed phase: Mild cortical enhancement at the level of right parietal abnormality which is nonspecific but would be supportive of a subacute infarct. Review of the MIP images confirms the above findings IMPRESSION: 1. 70-80% atheromatous narrowing at the right ICA bulb. 2. No other significant stenosis in the neck. 3. Intracranial atherosclerosis without proximal or correctable  stenosis. There is moderate atheromatous narrowing of the distal left P2 segment. 4. Presumed infarct in the right parietal lobe (see brain MRI report). Mild associated enhancement would correlate with subacute timing. Electronically Signed   By: Marnee Spring M.D.   On: 10/09/2017 10:59   Ct Cervical Spine Wo Contrast  Result Date: 10/08/2017 CLINICAL DATA:  Found on floor.  Bruising to left side of face EXAM: CT HEAD WITHOUT CONTRAST CT CERVICAL SPINE WITHOUT CONTRAST TECHNIQUE: Multidetector CT imaging of the head and cervical spine was performed following the standard protocol without intravenous contrast. Multiplanar CT image reconstructions of the cervical spine were also generated. COMPARISON:  None. FINDINGS: CT HEAD FINDINGS Brain: Area of low-density noted in the posteromedial right parietal lobe concerning for acute infarct. There is atrophy and chronic small vessel disease changes. No hemorrhage or hydrocephalus. Vascular: No hyperdense vessel or unexpected calcification. Skull: No acute calvarial abnormality. Sinuses/Orbits: No acute findings Other: None CT CERVICAL SPINE FINDINGS Alignment: No subluxation. Skull base and vertebrae: No fracture Soft tissues and spinal canal: No prevertebral fluid or swelling. No visible canal hematoma. Disc levels:  Diffuse degenerative disc and facet disease. Upper chest: No acute findings Other: No acute findings IMPRESSION: Subtle low-density in the posteromedial high right parietal lobe concerning for acute infarction. No hemorrhage. Diffuse degenerative disc and facet disease in the cervical spine. No acute bony abnormality. Electronically Signed  By: Charlett NoseKevin  Dover M.D.   On: 10/08/2017 23:33   Mr Brain Wo Contrast  Result Date: 10/09/2017 CLINICAL DATA:  84 y/o  M; possible syncope. EXAM: MRI HEAD WITHOUT CONTRAST TECHNIQUE: Multiplanar, multiecho pulse sequences of the brain and surrounding structures were obtained without intravenous contrast.  COMPARISON:  10/08/2017 CT head. FINDINGS: Brain: 3.8 x 3.9 x 4.6 cm (volume = 36 cm^3) region of predominantly cortical diffusion hyperintensity which demonstrates both areas of reduced, intermediate, and increased diffusion on ADC (see series 3, image 38 and series 350, image 38). The region is associated with increased T2 FLAIR hyperintense signal abnormality and minimal gyral swelling. No focus of reduced diffusion to suggest acute or early subacute infarction. No abnormal susceptibility hypointensity to indicate intracranial hemorrhage. Severalnonspecific foci of T2 FLAIR hyperintense signal abnormality in subcortical and periventricular white matter are compatible withmildchronic microvascular ischemic changes for age. Mildbrain parenchymal volume loss. There is displacement of cortical veins away from the calvarium over the right cerebral cortex with CSF signal (series 10, image 15 and series 6, image 16) which may represent a small hygroma. No midline shift. Vascular: Normal flow voids. Skull and upper cervical spine: Normal marrow signal. Sinuses/Orbits: Negative. Other: None. IMPRESSION: 1. Right superior and medial parietal lobe 36 cc area of predominantly cortical diffusion signal abnormality demonstrating areas of both reduced and increased diffusion. Findings probably represents a subacute infarction, but cerebritis or seizure activity should also be considered. No hemorrhage or mass effect. 2. Mild chronic microvascular ischemic changes and mild parenchymal volume loss of the brain. 3. Displacement of cortical veins over the right cerebral convexity from the calvarium, possibly small hygroma. No significant mass effect on the brain. Electronically Signed   By: Mitzi HansenLance  Furusawa-Stratton M.D.   On: 10/09/2017 04:03   Dg Hand Complete Left  Result Date: 10/08/2017 CLINICAL DATA:  Fall.  Right hand pain EXAM: LEFT HAND - COMPLETE 3+ VIEW COMPARISON:  None. FINDINGS: No acute bony abnormality.  Specifically, no fracture, subluxation, or dislocation. IMPRESSION: No acute bony abnormality. Electronically Signed   By: Charlett NoseKevin  Dover M.D.   On: 10/08/2017 23:21   Mr Maxine GlennMra Head Wo Contrast  Result Date: 10/09/2017 CLINICAL DATA:  84 y/o  M; evaluation for stroke. EXAM: MRA HEAD WITHOUT CONTRAST TECHNIQUE: Angiographic images of the Circle of Willis were obtained using MRA technique without intravenous contrast. COMPARISON:  10/09/2017 MRI head. FINDINGS: Internal carotid arteries: Patent. Mild lumen irregularity without significant stenosis compatible with atherosclerosis. Anterior cerebral arteries:  Patent. Middle cerebral arteries: Patent. Anterior communicating artery: Patent. Posterior communicating arteries: Patent. Left P2 short segment of moderate stenosis. Posterior cerebral arteries:  Patent. Basilar artery:  Patent. Vertebral arteries:  Patent. No aneurysm or large vessel occlusion identified. Right ACA ACA distributions demonstrate increased flow related signal, probably hyperemia. IMPRESSION: Patent anterior and posterior intracranial circulation. No large vessel occlusion or aneurysm. Increase flow related signal within right ACA and MCA distributions, probably hyperemia given right parietal abnormality. Electronically Signed   By: Mitzi HansenLance  Furusawa-Stratton M.D.   On: 10/09/2017 06:11   Dg Femur Min 2 Views Left  Result Date: 10/09/2017 CLINICAL DATA:  84 year old male with fall and left hip pain. EXAM: LEFT FEMUR 2 VIEWS COMPARISON:  Pelvic radiograph dated 10/09/2017 FINDINGS: There is no acute fracture or dislocation. Mild osteopenia. No significant arthritic changes. Vascular calcifications noted. The soft tissues appear unremarkable. IMPRESSION: Negative. Electronically Signed   By: Elgie CollardArash  Radparvar M.D.   On: 10/09/2017 05:36    Scheduled Meds: .  stroke: mapping our early stages of recovery book   Does not apply Once  . aspirin  300 mg Rectal Daily   Or  . aspirin  325 mg Oral Daily   . enoxaparin (LOVENOX) injection  40 mg Subcutaneous Q24H   Continuous Infusions:   LOS: 1 day    Time spent: > 35 minutes  Penny Pia, MD [facility] Pager [phone]  If 7PM-7AM, please contact night-coverage [email or web address] Password [email or web address] 10/10/2017, 1:19 PM

## 2017-10-10 NOTE — Progress Notes (Signed)
Physical Therapy Treatment Patient Details Name: Samuel Vega MRN: [id number] DOB: 02/21/1924 Today's Date: 10/10/2017    History of Present Illness Samuel Vega is a 84 y.o. male with history of severe aortic stenosis, PTSD presently not on any medications was brought to the ER after patient was found on the floor, presenting with right parietal stroke and MI.    PT Comments    Pt requires mod A +2 for all mobilities at this time. He transferred to the recliner chair via stand pivot with manual facilitation on L quad to prevent buckling. Pt would benefit from continued skilled PT to increase functional independence and safety with mobility. Will continue to follow.    Follow Up Recommendations  SNF;Supervision/Assistance - 24 hour     Equipment Recommendations  Rolling walker with 5" wheels    Recommendations for Other Services       Precautions / Restrictions Precautions Precautions: Fall Restrictions Weight Bearing Restrictions: No    Mobility  Bed Mobility Overal bed mobility: Needs Assistance Bed Mobility: Supine to Sit     Supine to sit: Mod assist;+2 for physical assistance;+2 for safety/equipment     General bed mobility comments: Mod A to progress LLE off EOB and to elevate torso. Cues for technique  Transfers Overall transfer level: Needs assistance Equipment used: Rolling walker (2 wheeled) Transfers: Sit to/from [employer] Sit to Stand: Mod assist;+2 physical assistance Stand pivot transfers: Mod assist;+2 physical assistance       General transfer comment: Mod A to rise from EOB into standing. LLE bucking and requiring manual facilitation at quad to maintain knee extension.   Ambulation/Gait                 Stairs            Wheelchair Mobility    Modified Rankin (Stroke Patients Only) Modified Rankin (Stroke Patients Only) Pre-Morbid Rankin Score: No symptoms Modified Rankin: Moderately severe  disability     Balance Overall balance assessment: Needs assistance Sitting-balance support: Feet supported Sitting balance-Leahy Scale: Fair Sitting balance - Comments: initally leaning to R, able to progress to sitting EOB with supervision   Standing balance support: Bilateral upper extremity supported Standing balance-Leahy Scale: Poor Standing balance comment: UE support on RW and mod A for balance.                            Cognition Arousal/Alertness: Awake/alert Behavior During Therapy: WFL for tasks assessed/performed(talkative) Overall Cognitive Status: Impaired/Different from baseline Area of Impairment: Attention;Safety/judgement                   Current Attention Level: Sustained     Safety/Judgement: Decreased awareness of safety;Decreased awareness of deficits            Exercises      General Comments        Pertinent Vitals/Pain Pain Assessment: Faces Faces Pain Scale: Hurts even more Pain Location: generalized due to struggling on the floor Pain Descriptors / Indicators: Grimacing;Sore Pain Intervention(s): Monitored during session;Limited activity within patient's tolerance;Repositioned    Home Living Family/patient expects to be discharged to:: Private residence Living Arrangements: Alone   Type of Home: House(condo) Home Access: Stairs to enter Entrance Stairs-Rails: Right Home Layout: One level Home Equipment: None Additional Comments: Per daughter in law pt and at least one of pt's sons have been estranged recently "they haven't talked in 2 years."  Prior Function Level of Independence: Independent      Comments: macular degeneration, reports drives a little, seems to have been having trouble at home taking care of himself   PT Goals (current goals can now be found in the care plan section) Acute Rehab PT Goals Patient Stated Goal: To get better PT Goal Formulation: With patient/family Time For Goal  Achievement: 10/23/17 Potential to Achieve Goals: Fair Progress towards PT goals: Progressing toward goals    Frequency    Min 3X/week      PT Plan Current plan remains appropriate    Co-evaluation              AM-PAC PT "6 Clicks" Daily Activity  Outcome Measure  Difficulty turning over in bed (including adjusting bedclothes, sheets and blankets)?: Unable Difficulty moving from lying on back to sitting on the side of the bed? : Unable Difficulty sitting down on and standing up from a chair with arms (e.g., wheelchair, bedside commode, etc,.)?: Unable Help needed moving to and from a bed to chair (including a wheelchair)?: A Lot Help needed walking in hospital room?: A Lot Help needed climbing 3-5 steps with a railing? : Total 6 Click Score: 8    End of Session Equipment Utilized During Treatment: Gait belt Activity Tolerance: Patient tolerated treatment well Patient left: in chair;with call bell/phone within reach;with chair alarm set Nurse Communication: Mobility status PT Visit Diagnosis: Other abnormalities of gait and mobility (R26.89);Ataxic gait (R26.0);Other symptoms and signs involving the nervous system (R29.898);History of falling (Z91.81);Unsteadiness on feet (R26.81);Hemiplegia and hemiparesis Hemiplegia - Right/Left: Left Hemiplegia - dominant/non-dominant: Non-dominant Hemiplegia - caused by: Cerebral infarction     Time: 1040-1110 PT Time Calculation (min) (ACUTE ONLY): 30 min  Charges:  $Therapeutic Activity: 8-22 mins                    G Codes:       Samuel Vega, [place] Pager [phone] Acute Rehab  Sheral ApleyHannah E Valory Vega 10/10/2017, 12:54 PM

## 2017-10-10 NOTE — Evaluation (Signed)
Occupational Therapy Evaluation Patient Details Name: Samuel Vega MRN: [id number] DOB: 08-01-1924 Today's Date: 10/10/2017    History of Present Illness Samuel Vega is a 84 y.o. male with history of severe aortic stenosis, PTSD presently not on any medications was brought to the ER after patient was found on the floor, presenting with right parietal stroke and MI.   Clinical Impression   Pt admitted with the above diagnoses and presents with below problem list. Pt will benefit from continued acute OT to address the below listed deficits and maximize independence with ADLs prior to d/c to venue below. PTA pt lived alone and likely was mod I level with ADLs and functional mobility/transfers. Pt is poor historian and family unable to provide details of PLOF. Pt presents with LLE>LUE weakness, L side sensory deficits and impaired cognition and balance. Pt currently mod +2 A for LB ADLs and functional transfers/mobility, mod A for UB ADLs.      Follow Up Recommendations  SNF;Supervision/Assistance - 24 hour    Equipment Recommendations  Other (comment)(defer to next venue)    Recommendations for Other Services       Precautions / Restrictions Precautions Precautions: Fall Restrictions Weight Bearing Restrictions: No      Mobility Bed Mobility Overal bed mobility: Needs Assistance Bed Mobility: Supine to Sit     Supine to sit: Mod assist;+2 for physical assistance;+2 for safety/equipment     General bed mobility comments: Mod A to progress LLE off EOB and to elevate torso. Cues for technique  Transfers Overall transfer level: Needs assistance Equipment used: Rolling walker (2 wheeled) Transfers: Sit to/from [employer] Sit to Stand: Mod assist;+2 physical assistance Stand pivot transfers: Mod assist;+2 physical assistance       General transfer comment: Mod A to rise from EOB into standing. LLE bucking and requiring manual facilitation at quad  to maintain knee extension.     Balance Overall balance assessment: Needs assistance Sitting-balance support: Feet supported Sitting balance-Leahy Scale: Fair Sitting balance - Comments: initally leaning to R, able to progress to sitting EOB with supervision   Standing balance support: Bilateral upper extremity supported Standing balance-Leahy Scale: Poor Standing balance comment: UE support on RW and mod A for balance.                           ADL either performed or assessed with clinical judgement   ADL Overall ADL's : Needs assistance/impaired Eating/Feeding: Set up;Sitting;Minimal assistance   Grooming: Moderate assistance;Sitting   Upper Body Bathing: Moderate assistance;Sitting   Lower Body Bathing: Moderate assistance;+2 for physical assistance;Sit to/from stand   Upper Body Dressing : Moderate assistance;Sitting   Lower Body Dressing: Moderate assistance;+2 for physical assistance;Sit to/from stand   Toilet Transfer: +2 for physical assistance;Stand-pivot;Moderate assistance;BSC;RW Toilet Transfer Details (indicate cue type and reason): simulated Toileting- Clothing Manipulation and Hygiene: Moderate assistance;+2 for physical assistance;Sit to/from stand   Tub/ Engineer, structural: Moderate assistance;+2 for physical assistance;Minimal assistance     General ADL Comments: Pt completed bed mobility and SPT from EOB to recliner.      Vision         Perception     Praxis      Pertinent Vitals/Pain Pain Assessment: Faces Faces Pain Scale: Hurts even more Pain Location: generalized due to struggling on the floor Pain Descriptors / Indicators: Grimacing;Sore Pain Intervention(s): Limited activity within patient's tolerance;Monitored during session;Repositioned     Hand Dominance Right  Extremity/Trunk Assessment Upper Extremity Assessment Upper Extremity Assessment: LUE deficits/detail LUE Deficits / Details: grossly 3+/5. Difficulty  coordinating (vs weakness) LUE movements.  LUE Sensation: decreased light touch;decreased proprioception LUE Coordination: decreased fine motor;decreased gross motor   Lower Extremity Assessment Lower Extremity Assessment: Defer to PT evaluation   Cervical / Trunk Assessment Cervical / Trunk Assessment: Kyphotic   Communication Communication Communication: HOH   Cognition Arousal/Alertness: Awake/alert Behavior During Therapy: WFL for tasks assessed/performed(talkative) Overall Cognitive Status: Impaired/Different from baseline Area of Impairment: Attention;Safety/judgement                   Current Attention Level: Sustained     Safety/Judgement: Decreased awareness of safety;Decreased awareness of deficits         General Comments  2 sons and daughter in law present at start and end of session. Discussed d/c plan with family.     Exercises Exercises: Other exercises Other Exercises Other Exercises: educated pt and family on AROM and some FMC activities/exercises for LUE.   Shoulder Instructions      Home Living Family/patient expects to be discharged to:: Private residence Living Arrangements: Alone   Type of Home: House(condo) Home Access: Stairs to enter Entrance Stairs-Number of Steps: 3 Entrance Stairs-Rails: Right Home Layout: One level               Home Equipment: None   Additional Comments: Per daughter in law pt and at least one of pt's sons have been estranged recently "they haven't talked in 2 years."       Prior Functioning/Environment Level of Independence: Independent        Comments: macular degeneration, reports drives a little, seems to have been having trouble at home taking care of himself        OT Problem List: Decreased strength;Decreased activity tolerance;Impaired balance (sitting and/or standing);Decreased cognition;Decreased safety awareness;Decreased knowledge of use of DME or AE;Decreased knowledge of  precautions;Cardiopulmonary status limiting activity      OT Treatment/Interventions: Self-care/ADL training;DME and/or AE instruction;Therapeutic activities;Patient/family education;Balance training    OT Goals(Current goals can be found in the care plan section) Acute Rehab OT Goals Patient Stated Goal: To get better OT Goal Formulation: With patient/family Time For Goal Achievement: 10/24/17 Potential to Achieve Goals: Good ADL Goals Pt Will Perform Grooming: with modified independence;sitting Pt Will Perform Upper Body Bathing: with modified independence;sitting Pt Will Perform Lower Body Bathing: with min assist;sit to/from stand Pt Will Perform Upper Body Dressing: with modified independence;sitting Pt Will Perform Lower Body Dressing: with min guard assist;sit to/from stand Pt Will Transfer to Toilet: with min assist;ambulating Pt Will Perform Toileting - Clothing Manipulation and hygiene: with min assist;sit to/from stand Additional ADL Goal #1: Pt will complete bed mobility with min A to prepare for OOB ADLs.  OT Frequency: Min 2X/week   Barriers to D/C:            Co-evaluation PT/OT/SLP Co-Evaluation/Treatment: Yes Reason for Co-Treatment: To address functional/ADL transfers;For patient/therapist safety   OT goals addressed during session: ADL's and self-care      AM-PAC PT "6 Clicks" Daily Activity     Outcome Measure Help from another person eating meals?: A Little Help from another person taking care of personal grooming?: A Lot Help from another person toileting, which includes using toliet, bedpan, or urinal?: A Lot Help from another person bathing (including washing, rinsing, drying)?: A Lot Help from another person to put on and taking off regular upper body clothing?: A Lot  Help from another person to put on and taking off regular lower body clothing?: A Lot 6 Click Score: 13   End of Session Equipment Utilized During Treatment: Gait belt;Rolling  walker Nurse Communication: Other (comment)(on RA during and at end of session with O2 sats 98-100)  Activity Tolerance: Patient tolerated treatment well Patient left: in chair;with call bell/phone within reach;with chair alarm set;with family/visitor present  OT Visit Diagnosis: Unsteadiness on feet (R26.81);Muscle weakness (generalized) (M62.81);History of falling (Z91.81);Other symptoms and signs involving cognitive function;Pain                Time: 1610-9604 OT Time Calculation (min): 36 min Charges:  OT General Charges $OT Visit: 1 Visit OT Evaluation $OT Eval Moderate Complexity: 1 Mod G-Codes:       Pilar Grammes 10/10/2017, 2:12 PM
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
## 2017-10-10 NOTE — Plan of Care (Signed)
  Ischemic Stroke/TIA Tissue Perfusion: Complications of ischemic stroke/TIA will be minimized 10/10/2017 0219 - Progressing by Earnest Rosierfori, Bryann Gentz O, RN   Nutrition: Dietary intake will improve 10/10/2017 0219 - Progressing by Earnest Rosierfori, Zayley Arras O, RN   Nutrition: Risk of aspiration will decrease 10/10/2017 0219 - Progressing by Earnest Rosierfori, Chelcy Bolda O, RN   Coping: Will verbalize positive feelings about self 10/10/2017 0219 - Progressing by Earnest Rosierfori, Ameet Sandy O, RN

## 2017-10-10 NOTE — Progress Notes (Signed)
*  PRELIMINARY RESULTS* Echocardiogram 2D Echocardiogram has been performed.  Jeryl Columbialliott, Kajsa Butrum 10/10/2017, 4:08 PM

## 2017-10-10 NOTE — Plan of Care (Signed)
  Progressing Safety: Ability to remain free from injury will improve 10/10/2017 0220 - Progressing by Earnest Rosierfori, Bryler Dibble O, RN Skin Integrity: Risk for impaired skin integrity will decrease 10/10/2017 0220 - Progressing by Earnest Rosierfori, Yusuke Beza O, RN Education: Knowledge of disease or condition will improve 10/10/2017 0220 - Progressing by Earnest Rosierfori, Tovah Slavick O, RN Knowledge of secondary prevention will improve 10/10/2017 0219 - Progressing by Earnest Rosierfori, Jujuan Dugo O, RN Coping: Will verbalize positive feelings about self 10/10/2017 0219 - Progressing by Earnest Rosierfori, Lachlan Mckim O, RN Will identify appropriate support needs 10/10/2017 0220 - Progressing by Earnest Rosierfori, Sahalie Beth O, RN Self-Care: Ability to participate in self-care as condition permits will improve 10/10/2017 0219 - Progressing by Earnest Rosierfori, Drexler Maland O, RN Ability to communicate needs accurately will improve 10/10/2017 0220 - Progressing by Earnest Rosierfori, Ica Daye O, RN Nutrition: Risk of aspiration will decrease 10/10/2017 0220 - Progressing by Earnest Rosierfori, Aislin Onofre O, RN 10/10/2017 0219 - Progressing by Earnest Rosierfori, Mekala Winger O, RN Dietary intake will improve 10/10/2017 0220 - Progressing by Earnest Rosierfori, Elmo Shumard O, RN 10/10/2017 0219 - Progressing by Earnest Rosierfori, Autum Benfer O, RN Ischemic Stroke/TIA Tissue Perfusion: Complications of ischemic stroke/TIA will be minimized 10/10/2017 0220 - Progressing by Earnest Rosierfori, Trisha Morandi O, RN 10/10/2017 0219 - Progressing by Earnest Rosierfori, Ximenna Fonseca O, RN

## 2017-10-10 NOTE — Consult Note (Addendum)
New Carotid Patient  Requested by:  Dr. Cena Benton ([facility])  Reason for consultation: right carotid stenosis s/p CVA   History of Present Illness   Samuel Vega is a 84 y.o. (08/21/1923) male who presents with chief complaint: found down.  Patient is confused so much of the history is obtained from the chart.  Reportedly pt was found down on the floor.  Reported he had L sided weakness.  At the same time, the patient notes chest pain, which he attributed to the fall.  Reported the patient live at home alone.  No prior CVA or TIA is documented in the chart.  The patient has known critical AS.  Risk factors for atherosclerosis include: HLD   Past Medical History:  Diagnosis Date  . Anxiety   . Cardiac murmur   . Chronic back pain   . ED (erectile dysfunction)   . Hyperlipidemia   . Macular degeneration   . PTSD (post-traumatic stress disorder)   . 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 ELBOW SURGERY    . EYE SURGERY      Social History   Socioeconomic History  . Marital status: Divorced    Spouse name: n/a  . Number of children: 2  . Years of education: Not on file  . Highest education level: Not on file  Social Needs  . Financial resource strain: Not on file  . Food insecurity - worry: Not on file  . Food insecurity - inability: Not on file  . Transportation needs - medical: Not on file  . Transportation needs - non-medical: Not on file  Occupational History  . Occupation: retired  Tobacco Use  . Smoking status: Never Smoker  . Smokeless tobacco: Never Used  Substance and Sexual Activity  . Alcohol use: No    Comment: none since 1980  . Drug use: No  . Sexual activity: Yes    Partners: Female  Other Topics Concern  . Not on file  Social History Narrative   Divorced in [place]. 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  His grandson is a Optometrist.    Family History    Problem Relation Age of Onset  . Hypertension Other     Current Facility-Administered Medications  Medication Dose Route Frequency Provider Last Rate Last Dose  .  stroke: mapping our early stages of recovery book   Does not apply Once Eduard Clos, MD      . acetaminophen (TYLENOL) tablet 650 mg  650 mg Oral Q4H PRN Eduard Clos, MD       Or  . acetaminophen (TYLENOL) solution 650 mg  650 mg Per Tube Q4H PRN Eduard Clos, MD       Or  . acetaminophen (TYLENOL) suppository 650 mg  650 mg Rectal Q4H PRN Eduard Clos, MD      . aspirin suppository 300 mg  300 mg Rectal Daily Eduard Clos, MD       Or  . aspirin tablet 325 mg  325 mg Oral Daily Eduard Clos, MD      . enoxaparin (LOVENOX) injection 40 mg  40 mg Subcutaneous Q24H Eduard Clos, MD        Allergies  Allergen Reactions  . Statins Other (See Comments)    MYALGIAS    REVIEW OF SYSTEMS (negative unless checked):   Cardiac:  [x]   Chest pain or chest pressure? []  Shortness of breath upon activity? []  Shortness of breath when lying flat? []  Irregular heart rhythm?  Vascular:  []  Pain in calf, thigh, or hip brought on by walking? []  Pain in feet at night that wakes you up from your sleep? []  Blood clot in your veins? []  Leg swelling?  Pulmonary:  []  Oxygen at home? []  Productive cough? []  Wheezing?  Neurologic:  [x]  Sudden weakness in arms or legs? []  Sudden numbness in arms or legs? []  Sudden onset of difficult speaking or slurred speech? []  Temporary loss of vision in one eye? []  Problems with dizziness?  Gastrointestinal:  []  Blood in stool? []  Vomited blood?  Genitourinary:  []  Burning when urinating? []  Blood in urine?  Psychiatric:  []  Major depression  Hematologic:  []  Bleeding problems? []  Problems with blood clotting?  Dermatologic:  []  Rashes or ulcers?  Constitutional:  []  Fever or chills?  Ear/Nose/Throat:  []  Change in  hearing? []  Nose bleeds? []  Sore throat?  Musculoskeletal:  []  Back pain? []  Joint pain? []  Muscle pain?   For VQI Use Only   PRE-ADM LIVING Home  AMB STATUS Ambulatory  CAD Sx None  PRIOR CHF None  STRESS TEST No    Physical Examination     Vitals:   10/09/17 2000 10/10/17 0053 10/10/17 0353 10/10/17 0735  BP: (!) 142/87 (!) 137/98 (!) 108/52 (!) 137/54  Pulse: 74 83 (!) 57 62  Resp: 18 16 18 17   Temp: 98 F (36.7 C) 97.9 F (36.6 C) 97.9 F (36.6 C) 97.6 F (36.4 C)  TempSrc: Oral Oral Oral Oral  SpO2: 100% (!) 78% 100% 99%  Weight:      Height:       Body mass index is 27.37 kg/m.  General Alert, Confused, WD, obvious facial trauma, found in ackward position in bed due to inability to reposition himself  Head [place], periorbital echymosis, some echymosis on lips  Ear/Nose/ Throat Hearing grossly decreased, nares without erythema or drainage, oropharynx with Erythema without Exudate, dentition does not appear to be damaged, Mallampati score: 3,   Eyes PERRL, tracks with eyes,    Neck Supple, mid-line trachea,    Pulmonary Sym exp, good B air movt, CTA B  Cardiac RRR, Nl S1, S2, Murmur present: SEM, No rubs, No S3,S4  Vascular Vessel Right Left  Radial Palpable Not palpable  Brachial Palpable Faintly palpable  Carotid Palpable, No Bruit Palpable, No Bruit  Aorta Not palpable N/A  Femoral Palpable Palpable  Popliteal Not palpable Not palpable  PT Faintly palpable Faintly palpable  DP Faintly palpable Faintly palpable    Gastro- intestinal soft, non-distended, non-tender to palpation, No guarding or rebound, no HSM, no masses, no CVAT B, No palpable prominent aortic pulse,    Musculo- skeletal R sided motor 5/5, LLE 2-3/5, LUE 2-3/5, dyscoordinated L sided motor (unable to self reposition on bed), Extremities without ischemic changes  , No edema present, Varicosities present: B small, No Lipodermatosclerosis present  Neurologic Difficult testing as confused at  times, sensation grossly intact  Psychiatric Confused, obvious lost in time, flight of thought  Dermatologic See M/S exam for extremity exam, limited patches echymosis in left extremities  Lymphatic  Palpable lymph nodes: None    Non-invasive Vascular Imaging   TTE (04/12/14)  LVEF 60-65%,   mild concentric LVH,   severe aortic stenosis (meangradient 55 mmHg - AVA 0.7 cm2, based on LVOT diameter of 2.1cm), diastolic dysfunction.   Laboratory  CBC CBC Latest Ref Rng & Units 10/09/2017 10/08/2017  WBC 4.0 - 10.5 K/uL 13.6(H) 15.9(H)  Hemoglobin 13.0 - 17.0 g/dL 12.9(L) 14.0  Hematocrit 39.0 - 52.0 % 39.4 42.9  Platelets 150 - 400 K/uL 177 180    BMP BMP Latest Ref Rng & Units 10/09/2017 10/08/2017  Glucose 65 - 99 mg/dL 147(W) 295(A)  BUN 6 - 20 mg/dL 21(H) 08(M)  Creatinine 0.61 - 1.24 mg/dL 5.78 4.69  Sodium 629 - 145 mmol/L 143 144  Potassium 3.5 - 5.1 mmol/L 4.0 4.1  Chloride 101 - 111 mmol/L 108 108  CO2 22 - 32 mmol/L 21(L) 22  Calcium 8.9 - 10.3 mg/dL 5.2(W) 9.3    Cardiac Panel (last 3 results) Recent Labs    10/08/17 2226  10/09/17 0456 10/09/17 1032 10/09/17 1538  CKTOTAL 3,971*  --  4,000*  --   --   TROPONINI  --    < > 1.71* 1.34* 1.22*   < > = values in this interval not displayed.    Coagulation No results found for: INR, PROTIME No results found for: PTT  Lipids    Component Value Date/Time   CHOL 235 (H) 10/09/2017 0456   TRIG 87 10/09/2017 0456   HDL 96 10/09/2017 0456   CHOLHDL 2.4 10/09/2017 0456   VLDL 17 10/09/2017 0456   LDLCALC 122 (H) 10/09/2017 0456     Radiology     Ct Angio Head W Or Wo Contrast  Result Date: 10/09/2017 CLINICAL DATA:  Stroke workup.  Patient found on floor. EXAM: CT ANGIOGRAPHY HEAD AND NECK TECHNIQUE: Multidetector CT imaging of the head and neck was performed using the standard protocol during bolus administration of intravenous contrast. Multiplanar CT image reconstructions and MIPs were obtained to  evaluate the vascular anatomy. Carotid stenosis measurements (when applicable) are obtained utilizing NASCET criteria, using the distal internal carotid diameter as the denominator. CONTRAST:  50mL ISOVUE-370 IOPAMIDOL (ISOVUE-370) INJECTION 76% COMPARISON:  Brain MRI from earlier today. FINDINGS: CT HEAD FINDINGS Brain: Right parietal low-density as characterized by MRI earlier today. No evidence of progression. No hemorrhage or hydrocephalus. Brain volume is excellent for age. There is a small right frontal hygroma without significant mass effect. Vascular: See below Skull: Questionable scalp swelling along the left parietal bone posteriorly. Negative for fracture. Sinuses: No acute finding.  Anterior nasal septum perforation. Orbits: Bilateral cataract resection. Review of the MIP images confirms the above findings CTA NECK FINDINGS Aortic arch: Mild atheromatous plaque.  Three vessel branching. Right carotid system: Advanced calcified and noncalcified plaque at the common carotid bifurcation and ICA bulb with 70-80% stenosis. No dissection or beading. Left carotid system: Moderate calcified and noncalcified plaque at the common carotid bifurcation without ulceration or flow limiting stenosis. Vertebral arteries: Proximal subclavian mild atherosclerosis. Right dominant vertebral artery. There is mild narrowing at the left vertebral origin. No beading or dissection. Skeleton: Diffuse cervical disc degeneration with narrowing reversal of lordosis. There is upper cervical facet arthropathy with C2-3 anterolisthesis. Other neck:  No acute or aggressive finding. Upper chest: Negative Review of the MIP images confirms the above findings CTA HEAD FINDINGS Anterior circulation: Atherosclerotic plaque on the carotid siphons without flow limiting stenosis. The left ICA is smaller than the right in the setting of hypoplastic left A1 segment. No visible branch occlusion or flow limiting stenosis. Negative for aneurysm. On  MIP images there is prominent vessels about the right parietal convexity. Posterior circulation: Vertebrobasilar arteries are smooth and diffusely patent. Moderate atheromatous  narrowing of the distal left P2 segment. Venous sinuses: Patent Anatomic variants: None significant Delayed phase: Mild cortical enhancement at the level of right parietal abnormality which is nonspecific but would be supportive of a subacute infarct. Review of the MIP images confirms the above findings IMPRESSION: 1. 70-80% atheromatous narrowing at the right ICA bulb. 2. No other significant stenosis in the neck. 3. Intracranial atherosclerosis without proximal or correctable stenosis. There is moderate atheromatous narrowing of the distal left P2 segment. 4. Presumed infarct in the right parietal lobe (see brain MRI report). Mild associated enhancement would correlate with subacute timing. Electronically Signed   By: Marnee Spring M.D.   On: 10/09/2017 10:59   Dg Chest 2 View  Result Date: 10/08/2017 CLINICAL DATA:  Fall EXAM: CHEST  2 VIEW COMPARISON:  None. FINDINGS: Mild cardiomegaly. Bibasilar atelectasis. No effusions. No edema. No acute bony abnormality. IMPRESSION: Mild cardiomegaly.  Bibasilar atelectasis. Electronically Signed   By: Charlett Nose M.D.   On: 10/08/2017 23:17   Dg Pelvis 1-2 Views  Result Date: 10/09/2017 CLINICAL DATA:  84 year old male with fall and left hip pain. EXAM: PELVIS - 1-2 VIEW COMPARISON:  None. FINDINGS: There is no acute fracture or dislocation. The bones are osteopenic. Mild arthritic changes. There is degenerative changes of the visualized lower lumbar spine. The soft tissues appear unremarkable. Vascular calcification noted. IMPRESSION: No acute fracture or dislocation. Electronically Signed   By: Elgie Collard M.D.   On: 10/09/2017 05:33   Dg Elbow 2 Views Left  Result Date: 10/09/2017 CLINICAL DATA:  84 year old male with fall and left elbow pain. EXAM: LEFT ELBOW - 2 VIEW  COMPARISON:  None. FINDINGS: There is no acute fracture or dislocation. Mild osteopenia. No arthritic changes. The soft tissues appear unremarkable. No joint effusion. IMPRESSION: Negative. Electronically Signed   By: Elgie Collard M.D.   On: 10/09/2017 05:34   Ct Head Wo Contrast  Result Date: 10/08/2017 CLINICAL DATA:  Found on floor.  Bruising to left side of face EXAM: CT HEAD WITHOUT CONTRAST CT CERVICAL SPINE WITHOUT CONTRAST TECHNIQUE: Multidetector CT imaging of the head and cervical spine was performed following the standard protocol without intravenous contrast. Multiplanar CT image reconstructions of the cervical spine were also generated. COMPARISON:  None. FINDINGS: CT HEAD FINDINGS Brain: Area of low-density noted in the posteromedial right parietal lobe concerning for acute infarct. There is atrophy and chronic small vessel disease changes. No hemorrhage or hydrocephalus. Vascular: No hyperdense vessel or unexpected calcification. Skull: No acute calvarial abnormality. Sinuses/Orbits: No acute findings Other: None CT CERVICAL SPINE FINDINGS Alignment: No subluxation. Skull base and vertebrae: No fracture Soft tissues and spinal canal: No prevertebral fluid or swelling. No visible canal hematoma. Disc levels:  Diffuse degenerative disc and facet disease. Upper chest: No acute findings Other: No acute findings IMPRESSION: Subtle low-density in the posteromedial high right parietal lobe concerning for acute infarction. No hemorrhage. Diffuse degenerative disc and facet disease in the cervical spine. No acute bony abnormality. Electronically Signed   By: Charlett Nose M.D.   On: 10/08/2017 23:33   Ct Angio Neck W Or Wo Contrast  Result Date: 10/09/2017 CLINICAL DATA:  Stroke workup.  Patient found on floor. EXAM: CT ANGIOGRAPHY HEAD AND NECK TECHNIQUE: Multidetector CT imaging of the head and neck was performed using the standard protocol during bolus administration of intravenous contrast.  Multiplanar CT image reconstructions and MIPs were obtained to evaluate the vascular anatomy. Carotid stenosis measurements (when applicable) are obtained utilizing  NASCET criteria, using the distal internal carotid diameter as the denominator. CONTRAST:  50mL ISOVUE-370 IOPAMIDOL (ISOVUE-370) INJECTION 76% COMPARISON:  Brain MRI from earlier today. FINDINGS: CT HEAD FINDINGS Brain: Right parietal low-density as characterized by MRI earlier today. No evidence of progression. No hemorrhage or hydrocephalus. Brain volume is excellent for age. There is a small right frontal hygroma without significant mass effect. Vascular: See below Skull: Questionable scalp swelling along the left parietal bone posteriorly. Negative for fracture. Sinuses: No acute finding.  Anterior nasal septum perforation. Orbits: Bilateral cataract resection. Review of the MIP images confirms the above findings CTA NECK FINDINGS Aortic arch: Mild atheromatous plaque.  Three vessel branching. Right carotid system: Advanced calcified and noncalcified plaque at the common carotid bifurcation and ICA bulb with 70-80% stenosis. No dissection or beading. Left carotid system: Moderate calcified and noncalcified plaque at the common carotid bifurcation without ulceration or flow limiting stenosis. Vertebral arteries: Proximal subclavian mild atherosclerosis. Right dominant vertebral artery. There is mild narrowing at the left vertebral origin. No beading or dissection. Skeleton: Diffuse cervical disc degeneration with narrowing reversal of lordosis. There is upper cervical facet arthropathy with C2-3 anterolisthesis. Other neck:  No acute or aggressive finding. Upper chest: Negative Review of the MIP images confirms the above findings CTA HEAD FINDINGS Anterior circulation: Atherosclerotic plaque on the carotid siphons without flow limiting stenosis. The left ICA is smaller than the right in the setting of hypoplastic left A1 segment. No visible branch  occlusion or flow limiting stenosis. Negative for aneurysm. On MIP images there is prominent vessels about the right parietal convexity. Posterior circulation: Vertebrobasilar arteries are smooth and diffusely patent. Moderate atheromatous narrowing of the distal left P2 segment. Venous sinuses: Patent Anatomic variants: None significant Delayed phase: Mild cortical enhancement at the level of right parietal abnormality which is nonspecific but would be supportive of a subacute infarct. Review of the MIP images confirms the above findings IMPRESSION: 1. 70-80% atheromatous narrowing at the right ICA bulb. 2. No other significant stenosis in the neck. 3. Intracranial atherosclerosis without proximal or correctable stenosis. There is moderate atheromatous narrowing of the distal left P2 segment. 4. Presumed infarct in the right parietal lobe (see brain MRI report). Mild associated enhancement would correlate with subacute timing. Electronically Signed   By: Marnee Spring M.D.   On: 10/09/2017 10:59   Ct Cervical Spine Wo Contrast  Result Date: 10/08/2017 CLINICAL DATA:  Found on floor.  Bruising to left side of face EXAM: CT HEAD WITHOUT CONTRAST CT CERVICAL SPINE WITHOUT CONTRAST TECHNIQUE: Multidetector CT imaging of the head and cervical spine was performed following the standard protocol without intravenous contrast. Multiplanar CT image reconstructions of the cervical spine were also generated. COMPARISON:  None. FINDINGS: CT HEAD FINDINGS Brain: Area of low-density noted in the posteromedial right parietal lobe concerning for acute infarct. There is atrophy and chronic small vessel disease changes. No hemorrhage or hydrocephalus. Vascular: No hyperdense vessel or unexpected calcification. Skull: No acute calvarial abnormality. Sinuses/Orbits: No acute findings Other: None CT CERVICAL SPINE FINDINGS Alignment: No subluxation. Skull base and vertebrae: No fracture Soft tissues and spinal canal: No  prevertebral fluid or swelling. No visible canal hematoma. Disc levels:  Diffuse degenerative disc and facet disease. Upper chest: No acute findings Other: No acute findings IMPRESSION: Subtle low-density in the posteromedial high right parietal lobe concerning for acute infarction. No hemorrhage. Diffuse degenerative disc and facet disease in the cervical spine. No acute bony abnormality. Electronically Signed   By:  Charlett Nose M.D.   On: 10/08/2017 23:33   Mr Brain Wo Contrast  Result Date: 10/09/2017 CLINICAL DATA:  84 y/o  M; possible syncope. EXAM: MRI HEAD WITHOUT CONTRAST TECHNIQUE: Multiplanar, multiecho pulse sequences of the brain and surrounding structures were obtained without intravenous contrast. COMPARISON:  10/08/2017 CT head. FINDINGS: Brain: 3.8 x 3.9 x 4.6 cm (volume = 36 cm^3) region of predominantly cortical diffusion hyperintensity which demonstrates both areas of reduced, intermediate, and increased diffusion on ADC (see series 3, image 38 and series 350, image 38). The region is associated with increased T2 FLAIR hyperintense signal abnormality and minimal gyral swelling. No focus of reduced diffusion to suggest acute or early subacute infarction. No abnormal susceptibility hypointensity to indicate intracranial hemorrhage. Severalnonspecific foci of T2 FLAIR hyperintense signal abnormality in subcortical and periventricular white matter are compatible withmildchronic microvascular ischemic changes for age. Mildbrain parenchymal volume loss. There is displacement of cortical veins away from the calvarium over the right cerebral cortex with CSF signal (series 10, image 15 and series 6, image 16) which may represent a small hygroma. No midline shift. Vascular: Normal flow voids. Skull and upper cervical spine: Normal marrow signal. Sinuses/Orbits: Negative. Other: None. IMPRESSION: 1. Right superior and medial parietal lobe 36 cc area of predominantly cortical diffusion signal abnormality  demonstrating areas of both reduced and increased diffusion. Findings probably represents a subacute infarction, but cerebritis or seizure activity should also be considered. No hemorrhage or mass effect. 2. Mild chronic microvascular ischemic changes and mild parenchymal volume loss of the brain. 3. Displacement of cortical veins over the right cerebral convexity from the calvarium, possibly small hygroma. No significant mass effect on the brain. Electronically Signed   By: Mitzi Hansen M.D.   On: 10/09/2017 04:03   Dg Hand Complete Left  Result Date: 10/08/2017 CLINICAL DATA:  Fall.  Right hand pain EXAM: LEFT HAND - COMPLETE 3+ VIEW COMPARISON:  None. FINDINGS: No acute bony abnormality. Specifically, no fracture, subluxation, or dislocation. IMPRESSION: No acute bony abnormality. Electronically Signed   By: Charlett Nose M.D.   On: 10/08/2017 23:21   Mr Maxine Glenn Head Wo Contrast  Result Date: 10/09/2017 CLINICAL DATA:  84 y/o  M; evaluation for stroke. EXAM: MRA HEAD WITHOUT CONTRAST TECHNIQUE: Angiographic images of the Circle of Willis were obtained using MRA technique without intravenous contrast. COMPARISON:  10/09/2017 MRI head. FINDINGS: Internal carotid arteries: Patent. Mild lumen irregularity without significant stenosis compatible with atherosclerosis. Anterior cerebral arteries:  Patent. Middle cerebral arteries: Patent. Anterior communicating artery: Patent. Posterior communicating arteries: Patent. Left P2 short segment of moderate stenosis. Posterior cerebral arteries:  Patent. Basilar artery:  Patent. Vertebral arteries:  Patent. No aneurysm or large vessel occlusion identified. Right ACA ACA distributions demonstrate increased flow related signal, probably hyperemia. IMPRESSION: Patent anterior and posterior intracranial circulation. No large vessel occlusion or aneurysm. Increase flow related signal within right ACA and MCA distributions, probably hyperemia given right parietal  abnormality. Electronically Signed   By: Mitzi Hansen M.D.   On: 10/09/2017 06:11   Dg Femur Min 2 Views Left  Result Date: 10/09/2017 CLINICAL DATA:  84 year old male with fall and left hip pain. EXAM: LEFT FEMUR 2 VIEWS COMPARISON:  Pelvic radiograph dated 10/09/2017 FINDINGS: There is no acute fracture or dislocation. Mild osteopenia. No significant arthritic changes. Vascular calcifications noted. The soft tissues appear unremarkable. IMPRESSION: Negative. Electronically Signed   By: Elgie Collard M.D.   On: 10/09/2017 05:36    I reviewed this  patient's Head MRI and CTA Neck, he has a sizeable infarct of R parietal lobe.  His R ICA has a focal stenosis that is >80% with associated plaque vs thrombus.  Lesion appears to be surgically accessible.  L ICA appears to be <50%.   Medical Decision Making   Samuel Vega is a 84 y.o. male who presents with: R CVA, likely sx RICA stenosis >80%, critical AS, possible MI vs demand ischemia.   Given extent of involved tissue and residual neuro deficits, I would re-evaluate the patient in another month to avoid exacerbating the ischemia in the infarcted segment with presumed hypotension from general anesthesia during a R CEA.  The pt's critical AS would raise this patient's risk stratification significantly, so full cardiac work-up prior to consideration of intervention is recommended.  Given the significant calcification at the R ICA lesion, CAS vs TCAR, might NOT be possible.  Maximal medical mgmt at this point:  Already on ASA.   Consider trial of additional statins  Liberal BP control already utilized  Will have the CTA Neck reviewed by Dr. Darrick Penna and our [employer] rep to see if pt candidate for TCAR on Monday.  Suspect there has been a significant decrease in functional status in this patient.  If the patient continues to be confused with dense neurologic deficits, a family meeting with Palliative Care might be  recommended to set global expectation and plans.  Thank you for allowing [place] to participate in this patient's care.   Leonides Sake, MD, FACS [facility] Office: [phone] Pager: [phone]  10/10/2017, 10:00 AM

## 2017-10-11 LAB — CK TOTAL AND CKMB (NOT AT ARMC)
CK TOTAL: 1207 U/L — AB (ref 49–397)
CK, MB: 8.9 ng/mL — AB (ref 0.5–5.0)
RELATIVE INDEX: 0.7 (ref 0.0–2.5)

## 2017-10-11 NOTE — Evaluation (Addendum)
Speech Language Pathology Evaluation Patient Details Name: Samuel Vega MRN: [id number] DOB: July 16, 1924 Today's Date: 10/11/2017 Time: 1130-1200 SLP Time Calculation (min) (ACUTE ONLY): 30 min  Problem List:  Patient Active Problem List   Diagnosis Date Noted  . Acute CVA (cerebrovascular accident) (HCC) 10/09/2017  . Elevated troponin 10/09/2017  . Rhabdomyolysis 10/09/2017  . Fall   . Syncope   . Carotid stenosis, right   . Cerebrovascular accident (CVA) due to embolism of right anterior cerebral artery (HCC)   . Loss of weight 03/20/2014  . Chronic back pain   . Hyperlipidemia 07/29/2012  . Hearing loss 07/29/2012  . Macular degeneration   . Tinnitus   . ED (erectile dysfunction)   . PTSD (post-traumatic stress disorder)   . Aortic stenosis 09/11/2009   Past Medical History:  Past Medical History:  Diagnosis Date  . Anxiety   . Cardiac murmur   . Chronic back pain   . ED (erectile dysfunction)   . Hyperlipidemia   . Macular degeneration   . PTSD (post-traumatic stress disorder)   . 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 ELBOW SURGERY    . EYE SURGERY     HPI:  Samuel Thiam Slaughteris a 84 y.o.malewithhistory of severe aortic stenosis, PTSD presently not on any medications was brought to the ER after patient was found on the floor. Patient states at around 10 AM yesterday morning while watching television he felt something funny about his left arm going limp and he was getting more confused. He stood up and try to walk when he fell onto the floor. Following which she states he may have lost consciousness. When he was awake he was trying to crawl on the floor to get help he had some chest pain which he attributes to the fall. Later in the evening police had to break into his house along with his family members and patient was found on the  floor initially confused. Patient was brought to the ER.MRI revealed right superior and medial parietal lobe 36 cc area of predominantly cortical diffusion signal abnormality demonstrating areas of both reduced and increased diffusion. Findings probably represents a subacute infarction.   Assessment / Plan / Recommendation Clinical Impression  Pt's cognitive abilities are difficult to fully assess d/t personality differences including off-topic "joking", self-directed attempts at his own care and desire to "teach nursing" how to perform tasks. Pt's son, daughter-in-law and grandson present. DIL states that pt has not be in contact with his father for many years d/t pt's personality and continued "choices to act out." DIL states that they are supportive of pt but that he frequently chooses to live as he desires with no regard for impact on family. With pt, SLP discussed acute CVA and need for safety precautions. Pt attempts to monopolize conversation with self-dirrected care - such as need to get out bed to remain healthy, no need to wait on nursing to perform tasks that he deems necessary etc. SLP discussed with pt recommendation for pt to discharge to SNF to allow for further treatment with PT/OT and ensure safer discharge plan. Pt is agreeable to SNF placement for further rehab. ST will defer further cognitive assessment and treatment within that venue of care.     SLP Assessment  SLP Recommendation/Assessment: All further Speech Lanaguage Pathology  needs can be addressed in the next  venue of care SLP Visit Diagnosis: Cognitive communication deficit (R41.841)    Follow Up Recommendations  Skilled Nursing facility(family would like [facility])    Frequency and Duration           SLP Evaluation Cognition  Overall Cognitive Status: Difficult to assess Arousal/Alertness: Awake/alert Orientation Level: Oriented X4 Attention: Selective Selective Attention: Appears intact Behaviors: (wants to  direct his own care) Safety/Judgment: Impaired       Comprehension  Auditory Comprehension Overall Auditory Comprehension: Appears within functional limits for tasks assessed    Expression Expression Primary Mode of Expression: Verbal Verbal Expression Overall Verbal Expression: Appears within functional limits for tasks assessed   Oral / Motor  Oral Motor/Sensory Function Overall Oral Motor/Sensory Function: Within functional limits Motor Speech Overall Motor Speech: Appears within functional limits for tasks assessed   GO                    Yesenia Fontenette 10/11/2017, 2:26 PM

## 2017-10-11 NOTE — Progress Notes (Addendum)
PROGRESS NOTE    Samuel Vega  WUJ:[id number] DOB: 04/14/1924 DOA: 10/08/2017 PCP: Porfirio Oar, PA-C    Brief Narrative:  84 y.o. male with history of severe aortic stenosis, PTSD presently not on any medications was brought to the ER after patient was found on the floor, presenting with right parietal stroke and MI.  Per cardiology they do not anticipate any cardiac intervention given recent stroke. Neurology on board and have requested vascular surgery consult addendum which has been done  Assessment & Plan:   Principal Problem:   Cerebrovascular accident (CVA) due to embolism of right anterior cerebral artery (HCC)  - Severe RICA stenosis vs cardioembolic: Patient presented with left-sided deficits - Holding statin secondary to elevated LFTs plan is to start statin on discharge once LFTs normalize  Active Problems:   Aortic stenosis   PTSD (post-traumatic stress disorder) -Stable    Elevated troponin - pt is s/p MI, as such cardiology limited in terms of intervention and currently no plans for further intervention given recent stroke.  Medical management at this point  Rhabdomyolysis -resolving with fluids. Have discontinued.     Syncope - Most likely secondary to principal problems stroke/MI    Carotid stenosis, right - Patient found to have right ICA stenosis 70-80% on CTA - Consulted vascular surgeon and plan is to reevaluate in one month - No operation plan at this moment and plan is to optimize medical management.   DVT prophylaxis: Lovenox Code Status: Full Family Communication: none at bedside. Neurologist updated family per his notes. Disposition Plan: once cleared for d/c by neurology and vascular surgery   Consultants:   Vascular surgery  Cardiology  Neurology  Procedures: BP: (!) 137/98 (!) 108/52 (!) 137/54 115/86  Pulse: 83 (!) 57 62 80  Resp: 16 18 17 17   Temp: 97.9 F (36.6 C) 97.9 F (36.6 C) 97.6 F (36.4 C) (!) 97.5 F (36.4  C)  TempSrc: Oral Oral Oral Oral  SpO2: (!) 78% 100% 99% 97%  Weight:      Height:       No intake or output data in the 24 hours ending 10/10/17 1319 Filed Weights   10/08/17 2026  Weight: 81.6 kg (180 lb)    Examination: Exam unchanged when compared to prior on 10/10/2017  General exam: Appears calm and comfortable, in nad. Respiratory system: Clear to auscultation. Respiratory effort normal. Cardiovascular system: S1 & S2 heard, RRR. No JVD, murmurs, rubs, gallops  Gastrointestinal system: Abdomen is nondistended, soft and nontender. No organomegaly or masses felt. Normal bowel sounds heard. Central nervous system: Alert and oriented. No focal neurological deficits. Extremities: Left leg weakness when compared to the right, answers questions appropriatelyappropriately Skin: bruising left side of the face, no ulcers on limited exam. Psychiatry:Mood & affect appropriate.     Data Reviewed: I have personally reviewed following labs and imaging studies  CBC: Recent Labs  Lab 10/08/17 2226 10/09/17 0456  WBC 15.9* 13.6*  NEUTROABS 13.7* 10.9*  HGB 14.0 12.9*  HCT 42.9 39.4  MCV 83.8 83.5  PLT 180 177   Basic Metabolic Panel: Recent Labs  Lab 10/08/17 2226 10/09/17 0456  NA 144 143  K 4.1 4.0  CL 108 108  CO2 22 21*  GLUCOSE 117* 119*  BUN 25* 29*  CREATININE 1.01 1.02  CALCIUM 9.3 8.8*   GFR: Estimated Creatinine Clearance: 45.6 mL/min (by C-G formula based on SCr of 1.02 mg/dL). Liver Function Tests: Recent Labs  Lab 10/08/17 2226 10/09/17  0456  AST 169* 157*  ALT 45 43  ALKPHOS 77 69  BILITOT 1.1 0.9  PROT 7.2 6.2*  ALBUMIN 3.5 3.2*   No results for input(s): LIPASE, AMYLASE in the last 168 hours. No results for input(s): AMMONIA in the last 168 hours. Coagulation Profile: No results for input(s): INR, PROTIME in the last 168 hours. Cardiac Enzymes: Recent Labs  Lab 10/08/17 2226 10/08/17 2315 10/09/17 0456 10/09/17 1032 10/09/17 1538    CKTOTAL 3,971*  --  4,000*  --   --   TROPONINI  --  1.71* 1.71* 1.34* 1.22*   BNP (last 3 results) No results for input(s): PROBNP in the last 8760 hours. HbA1C: Recent Labs    10/09/17 0456  HGBA1C 5.2   CBG: No results for input(s): GLUCAP in the last 168 hours. Lipid Profile: Recent Labs    10/09/17 0456  CHOL 235*  HDL 96  LDLCALC 122*  TRIG 87  CHOLHDL 2.4   Thyroid Function Tests: No results for input(s): TSH, T4TOTAL, FREET4, T3FREE, THYROIDAB in the last 72 hours. Anemia Panel: No results for input(s): VITAMINB12, FOLATE, FERRITIN, TIBC, IRON, RETICCTPCT in the last 72 hours. Sepsis Labs: No results for input(s): PROCALCITON, LATICACIDVEN in the last 168 hours.  No results found for this or any previous visit (from the past 240 hour(s)).       Radiology Studies: Ct Angio Head W Or Wo Contrast  Result Date: 10/09/2017 CLINICAL DATA:  Stroke workup.  Patient found on floor. EXAM: CT ANGIOGRAPHY HEAD AND NECK TECHNIQUE: Multidetector CT imaging of the head and neck was performed using the standard protocol during bolus administration of intravenous contrast. Multiplanar CT image reconstructions and MIPs were obtained to evaluate the vascular anatomy. Carotid stenosis measurements (when applicable) are obtained utilizing NASCET criteria, using the distal internal carotid diameter as the denominator. CONTRAST:  50mL ISOVUE-370 IOPAMIDOL (ISOVUE-370) INJECTION 76% COMPARISON:  Brain MRI from earlier today. FINDINGS: CT HEAD FINDINGS Brain: Right parietal low-density as characterized by MRI earlier today. No evidence of progression. No hemorrhage or hydrocephalus. Brain volume is excellent for age. There is a small right frontal hygroma without significant mass effect. Vascular: See below Skull: Questionable scalp swelling along the left parietal bone posteriorly. Negative for fracture. Sinuses: No acute finding.  Anterior nasal septum perforation. Orbits: Bilateral cataract  resection. Review of the MIP images confirms the above findings CTA NECK FINDINGS Aortic arch: Mild atheromatous plaque.  Three vessel branching. Right carotid system: Advanced calcified and noncalcified plaque at the common carotid bifurcation and ICA bulb with 70-80% stenosis. No dissection or beading. Left carotid system: Moderate calcified and noncalcified plaque at the common carotid bifurcation without ulceration or flow limiting stenosis. Vertebral arteries: Proximal subclavian mild atherosclerosis. Right dominant vertebral artery. There is mild narrowing at the left vertebral origin. No beading or dissection. Skeleton: Diffuse cervical disc degeneration with narrowing reversal of lordosis. There is upper cervical facet arthropathy with C2-3 anterolisthesis. Other neck:  No acute or aggressive finding. Upper chest: Negative Review of the MIP images confirms the above findings CTA HEAD FINDINGS Anterior circulation: Atherosclerotic plaque on the carotid siphons without flow limiting stenosis. The left ICA is smaller than the right in the setting of hypoplastic left A1 segment. No visible branch occlusion or flow limiting stenosis. Negative for aneurysm. On MIP images there is prominent vessels about the right parietal convexity. Posterior circulation: Vertebrobasilar arteries are smooth and diffusely patent. Moderate atheromatous narrowing of the distal left P2 segment. Venous sinuses:  Patent Anatomic variants: None significant Delayed phase: Mild cortical enhancement at the level of right parietal abnormality which is nonspecific but would be supportive of a subacute infarct. Review of the MIP images confirms the above findings IMPRESSION: 1. 70-80% atheromatous narrowing at the right ICA bulb. 2. No other significant stenosis in the neck. 3. Intracranial atherosclerosis without proximal or correctable stenosis. There is moderate atheromatous narrowing of the distal left P2 segment. 4. Presumed infarct in the  right parietal lobe (see brain MRI report). Mild associated enhancement would correlate with subacute timing. Electronically Signed   By: Marnee SpringJonathon  Watts M.D.   On: 10/09/2017 10:59   Dg Chest 2 View  Result Date: 10/08/2017 CLINICAL DATA:  Fall EXAM: CHEST  2 VIEW COMPARISON:  None. FINDINGS: Mild cardiomegaly. Bibasilar atelectasis. No effusions. No edema. No acute bony abnormality. IMPRESSION: Mild cardiomegaly.  Bibasilar atelectasis. Electronically Signed   By: Charlett NoseKevin  Dover M.D.   On: 10/08/2017 23:17   Dg Pelvis 1-2 Views  Result Date: 10/09/2017 CLINICAL DATA:  84 year old male with fall and left hip pain. EXAM: PELVIS - 1-2 VIEW COMPARISON:  None. FINDINGS: There is no acute fracture or dislocation. The bones are osteopenic. Mild arthritic changes. There is degenerative changes of the visualized lower lumbar spine. The soft tissues appear unremarkable. Vascular calcification noted. IMPRESSION: No acute fracture or dislocation. Electronically Signed   By: Elgie CollardArash  Radparvar M.D.   On: 10/09/2017 05:33   Dg Elbow 2 Views Left  Result Date: 10/09/2017 CLINICAL DATA:  84 year old male with fall and left elbow pain. EXAM: LEFT ELBOW - 2 VIEW COMPARISON:  None. FINDINGS: There is no acute fracture or dislocation. Mild osteopenia. No arthritic changes. The soft tissues appear unremarkable. No joint effusion. IMPRESSION: Negative. Electronically Signed   By: Elgie CollardArash  Radparvar M.D.   On: 10/09/2017 05:34   Ct Head Wo Contrast  Result Date: 10/08/2017 CLINICAL DATA:  Found on floor.  Bruising to left side of face EXAM: CT HEAD WITHOUT CONTRAST CT CERVICAL SPINE WITHOUT CONTRAST TECHNIQUE: Multidetector CT imaging of the head and cervical spine was performed following the standard protocol without intravenous contrast. Multiplanar CT image reconstructions of the cervical spine were also generated. COMPARISON:  None. FINDINGS: CT HEAD FINDINGS Brain: Area of low-density noted in the posteromedial right  parietal lobe concerning for acute infarct. There is atrophy and chronic small vessel disease changes. No hemorrhage or hydrocephalus. Vascular: No hyperdense vessel or unexpected calcification. Skull: No acute calvarial abnormality. Sinuses/Orbits: No acute findings Other: None CT CERVICAL SPINE FINDINGS Alignment: No subluxation. Skull base and vertebrae: No fracture Soft tissues and spinal canal: No prevertebral fluid or swelling. No visible canal hematoma. Disc levels:  Diffuse degenerative disc and facet disease. Upper chest: No acute findings Other: No acute findings IMPRESSION: Subtle low-density in the posteromedial high right parietal lobe concerning for acute infarction. No hemorrhage. Diffuse degenerative disc and facet disease in the cervical spine. No acute bony abnormality. Electronically Signed   By: Charlett NoseKevin  Dover M.D.   On: 10/08/2017 23:33   Ct Angio Neck W Or Wo Contrast  Result Date: 10/09/2017 CLINICAL DATA:  Stroke workup.  Patient found on floor. EXAM: CT ANGIOGRAPHY HEAD AND NECK TECHNIQUE: Multidetector CT imaging of the head and neck was performed using the standard protocol during bolus administration of intravenous contrast. Multiplanar CT image reconstructions and MIPs were obtained to evaluate the vascular anatomy. Carotid stenosis measurements (when applicable) are obtained utilizing NASCET criteria, using the distal internal carotid diameter as  the denominator. CONTRAST:  50mL ISOVUE-370 IOPAMIDOL (ISOVUE-370) INJECTION 76% COMPARISON:  Brain MRI from earlier today. FINDINGS: CT HEAD FINDINGS Brain: Right parietal low-density as characterized by MRI earlier today. No evidence of progression. No hemorrhage or hydrocephalus. Brain volume is excellent for age. There is a small right frontal hygroma without significant mass effect. Vascular: See below Skull: Questionable scalp swelling along the left parietal bone posteriorly. Negative for fracture. Sinuses: No acute finding.  Anterior  nasal septum perforation. Orbits: Bilateral cataract resection. Review of the MIP images confirms the above findings CTA NECK FINDINGS Aortic arch: Mild atheromatous plaque.  Three vessel branching. Right carotid system: Advanced calcified and noncalcified plaque at the common carotid bifurcation and ICA bulb with 70-80% stenosis. No dissection or beading. Left carotid system: Moderate calcified and noncalcified plaque at the common carotid bifurcation without ulceration or flow limiting stenosis. Vertebral arteries: Proximal subclavian mild atherosclerosis. Right dominant vertebral artery. There is mild narrowing at the left vertebral origin. No beading or dissection. Skeleton: Diffuse cervical disc degeneration with narrowing reversal of lordosis. There is upper cervical facet arthropathy with C2-3 anterolisthesis. Other neck:  No acute or aggressive finding. Upper chest: Negative Review of the MIP images confirms the above findings CTA HEAD FINDINGS Anterior circulation: Atherosclerotic plaque on the carotid siphons without flow limiting stenosis. The left ICA is smaller than the right in the setting of hypoplastic left A1 segment. No visible branch occlusion or flow limiting stenosis. Negative for aneurysm. On MIP images there is prominent vessels about the right parietal convexity. Posterior circulation: Vertebrobasilar arteries are smooth and diffusely patent. Moderate atheromatous narrowing of the distal left P2 segment. Venous sinuses: Patent Anatomic variants: None significant Delayed phase: Mild cortical enhancement at the level of right parietal abnormality which is nonspecific but would be supportive of a subacute infarct. Review of the MIP images confirms the above findings IMPRESSION: 1. 70-80% atheromatous narrowing at the right ICA bulb. 2. No other significant stenosis in the neck. 3. Intracranial atherosclerosis without proximal or correctable stenosis. There is moderate atheromatous narrowing of  the distal left P2 segment. 4. Presumed infarct in the right parietal lobe (see brain MRI report). Mild associated enhancement would correlate with subacute timing. Electronically Signed   By: Marnee Spring M.D.   On: 10/09/2017 10:59   Ct Cervical Spine Wo Contrast  Result Date: 10/08/2017 CLINICAL DATA:  Found on floor.  Bruising to left side of face EXAM: CT HEAD WITHOUT CONTRAST CT CERVICAL SPINE WITHOUT CONTRAST TECHNIQUE: Multidetector CT imaging of the head and cervical spine was performed following the standard protocol without intravenous contrast. Multiplanar CT image reconstructions of the cervical spine were also generated. COMPARISON:  None. FINDINGS: CT HEAD FINDINGS Brain: Area of low-density noted in the posteromedial right parietal lobe concerning for acute infarct. There is atrophy and chronic small vessel disease changes. No hemorrhage or hydrocephalus. Vascular: No hyperdense vessel or unexpected calcification. Skull: No acute calvarial abnormality. Sinuses/Orbits: No acute findings Other: None CT CERVICAL SPINE FINDINGS Alignment: No subluxation. Skull base and vertebrae: No fracture Soft tissues and spinal canal: No prevertebral fluid or swelling. No visible canal hematoma. Disc levels:  Diffuse degenerative disc and facet disease. Upper chest: No acute findings Other: No acute findings IMPRESSION: Subtle low-density in the posteromedial high right parietal lobe concerning for acute infarction. No hemorrhage. Diffuse degenerative disc and facet disease in the cervical spine. No acute bony abnormality. Electronically Signed   By: Charlett Nose M.D.   On: 10/08/2017 23:33  Mr Brain Wo Contrast  Result Date: 10/09/2017 CLINICAL DATA:  84 y/o  M; possible syncope. EXAM: MRI HEAD WITHOUT CONTRAST TECHNIQUE: Multiplanar, multiecho pulse sequences of the brain and surrounding structures were obtained without intravenous contrast. COMPARISON:  10/08/2017 CT head. FINDINGS: Brain: 3.8 x 3.9 x  4.6 cm (volume = 36 cm^3) region of predominantly cortical diffusion hyperintensity which demonstrates both areas of reduced, intermediate, and increased diffusion on ADC (see series 3, image 38 and series 350, image 38). The region is associated with increased T2 FLAIR hyperintense signal abnormality and minimal gyral swelling. No focus of reduced diffusion to suggest acute or early subacute infarction. No abnormal susceptibility hypointensity to indicate intracranial hemorrhage. Severalnonspecific foci of T2 FLAIR hyperintense signal abnormality in subcortical and periventricular white matter are compatible withmildchronic microvascular ischemic changes for age. Mildbrain parenchymal volume loss. There is displacement of cortical veins away from the calvarium over the right cerebral cortex with CSF signal (series 10, image 15 and series 6, image 16) which may represent a small hygroma. No midline shift. Vascular: Normal flow voids. Skull and upper cervical spine: Normal marrow signal. Sinuses/Orbits: Negative. Other: None. IMPRESSION: 1. Right superior and medial parietal lobe 36 cc area of predominantly cortical diffusion signal abnormality demonstrating areas of both reduced and increased diffusion. Findings probably represents a subacute infarction, but cerebritis or seizure activity should also be considered. No hemorrhage or mass effect. 2. Mild chronic microvascular ischemic changes and mild parenchymal volume loss of the brain. 3. Displacement of cortical veins over the right cerebral convexity from the calvarium, possibly small hygroma. No significant mass effect on the brain. Electronically Signed   By: Mitzi Hansen M.D.   On: 10/09/2017 04:03   Dg Hand Complete Left  Result Date: 10/08/2017 CLINICAL DATA:  Fall.  Right hand pain EXAM: LEFT HAND - COMPLETE 3+ VIEW COMPARISON:  None. FINDINGS: No acute bony abnormality. Specifically, no fracture, subluxation, or dislocation. IMPRESSION: No  acute bony abnormality. Electronically Signed   By: Charlett Nose M.D.   On: 10/08/2017 23:21   Mr Maxine Glenn Head Wo Contrast  Result Date: 10/09/2017 CLINICAL DATA:  84 y/o  M; evaluation for stroke. EXAM: MRA HEAD WITHOUT CONTRAST TECHNIQUE: Angiographic images of the Circle of Willis were obtained using MRA technique without intravenous contrast. COMPARISON:  10/09/2017 MRI head. FINDINGS: Internal carotid arteries: Patent. Mild lumen irregularity without significant stenosis compatible with atherosclerosis. Anterior cerebral arteries:  Patent. Middle cerebral arteries: Patent. Anterior communicating artery: Patent. Posterior communicating arteries: Patent. Left P2 short segment of moderate stenosis. Posterior cerebral arteries:  Patent. Basilar artery:  Patent. Vertebral arteries:  Patent. No aneurysm or large vessel occlusion identified. Right ACA ACA distributions demonstrate increased flow related signal, probably hyperemia. IMPRESSION: Patent anterior and posterior intracranial circulation. No large vessel occlusion or aneurysm. Increase flow related signal within right ACA and MCA distributions, probably hyperemia given right parietal abnormality. Electronically Signed   By: Mitzi Hansen M.D.   On: 10/09/2017 06:11   Dg Femur Min 2 Views Left  Result Date: 10/09/2017 CLINICAL DATA:  84 year old male with fall and left hip pain. EXAM: LEFT FEMUR 2 VIEWS COMPARISON:  Pelvic radiograph dated 10/09/2017 FINDINGS: There is no acute fracture or dislocation. Mild osteopenia. No significant arthritic changes. Vascular calcifications noted. The soft tissues appear unremarkable. IMPRESSION: Negative. Electronically Signed   By: Elgie Collard M.D.   On: 10/09/2017 05:36    Scheduled Meds: .  stroke: mapping our early stages of recovery book  Does not apply Once  . aspirin  300 mg Rectal Daily   Or  . aspirin  325 mg Oral Daily  . enoxaparin (LOVENOX) injection  40 mg Subcutaneous Q24H    Continuous Infusions:   LOS: 1 day    Time spent: > 35 minutes  Penny Pia, MD [facility] Pager [phone]  If 7PM-7AM, please contact night-coverage [email or web address] Password [email or web address] 10/10/2017, 1:19 PM
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
## 2017-10-11 NOTE — [place] FL2 (Signed)
[place] MEDICAID FL2 LEVEL OF CARE SCREENING TOOL     IDENTIFICATION  Patient Name: Samuel Vega Birthdate: 02/01/1924 Sex: male Admission Date (Current Location): 10/08/2017  [facility] [place]   Facility and Address:  [facility] [place], [address], [place], [place] [address]      Provider Number: [id number]  Attending Physician Name and Address:  Penny PiaVega, [place], MD  Relative Name and Phone Number:       Current Level of Care: Hospital Recommended Level of Care: Skilled Nursing Facility Prior Approval Number:    Date Approved/Denied:   PASRR Number: [id number]  Discharge Plan: SNF    Current Diagnoses: Patient Active Problem List   Diagnosis Date Noted  . Acute CVA (cerebrovascular accident) (HCC) 10/09/2017  . Elevated troponin 10/09/2017  . Rhabdomyolysis 10/09/2017  . Fall   . Syncope   . Carotid stenosis, right   . Cerebrovascular accident (CVA) due to embolism of right anterior cerebral artery (HCC)   . Loss of weight 03/20/2014  . Chronic back pain   . Hyperlipidemia 07/29/2012  . Hearing loss 07/29/2012  . Macular degeneration   . Tinnitus   . ED (erectile dysfunction)   . PTSD (post-traumatic stress disorder)   . Aortic stenosis 09/11/2009    Orientation RESPIRATION BLADDER Height & Weight     Self, Time, Situation, Place  Normal Continent Weight: 180 lb (81.6 kg) Height:  5\' 8"  (172.7 cm)  BEHAVIORAL SYMPTOMS/MOOD NEUROLOGICAL BOWEL NUTRITION STATUS      Continent Diet(Heart Healthy diet, thin liquids)  AMBULATORY STATUS COMMUNICATION OF NEEDS Skin   Extensive Assist Verbally Normal                       Personal Care Assistance Level of Assistance  Bathing, Feeding, Dressing Bathing Assistance: Maximum assistance Feeding assistance: Independent Dressing Assistance: Maximum assistance     Functional Limitations Info  Sight, Hearing, Speech Sight Info: Adequate Hearing Info:  Adequate Speech Info: Adequate    SPECIAL CARE FACTORS FREQUENCY  PT (By licensed PT), OT (By licensed OT)     PT Frequency: 3x week OT Frequency: 3x week            Contractures Contractures Info: Not present    Additional Factors Info  Code Status, Allergies Code Status Info: Full Code Allergies Info: Statins           Current Medications (10/11/2017):  This is the current hospital active medication list Current Facility-Administered Medications  Medication Dose Route Frequency Provider Last Rate Last Dose  .  stroke: mapping our early stages of recovery book   Does not apply Once Eduard ClosKakrakandy, Arshad N, MD      . acetaminophen (TYLENOL) tablet 650 mg  650 mg Oral Q4H PRN Eduard ClosKakrakandy, Arshad N, MD       Or  . acetaminophen (TYLENOL) solution 650 mg  650 mg Per Tube Q4H PRN Eduard ClosKakrakandy, Arshad N, MD       Or  . acetaminophen (TYLENOL) suppository 650 mg  650 mg Rectal Q4H PRN Eduard ClosKakrakandy, Arshad N, MD      . aspirin suppository 300 mg  300 mg Rectal Daily Eduard ClosKakrakandy, Arshad N, MD       Or  . aspirin tablet 325 mg  325 mg Oral Daily Eduard ClosKakrakandy, Arshad N, MD   325 mg at 10/11/17 1000  . enoxaparin (LOVENOX) injection 40 mg  40 mg Subcutaneous Q24H Eduard ClosKakrakandy, Arshad N, MD  40 mg at 10/11/17 1000     Discharge Medications: Please see discharge summary for a list of discharge medications.  Relevant Imaging Results:  Relevant Lab Results:   Additional Information SSN: [id number]  Maree Krabbe, LCSW

## 2017-10-12 DIAGNOSIS — D62 Acute posthemorrhagic anemia: Secondary | ICD-10-CM

## 2017-10-12 DIAGNOSIS — I350 Nonrheumatic aortic (valve) stenosis: Secondary | ICD-10-CM

## 2017-10-12 DIAGNOSIS — I6521 Occlusion and stenosis of right carotid artery: Secondary | ICD-10-CM

## 2017-10-12 DIAGNOSIS — I10 Essential (primary) hypertension: Secondary | ICD-10-CM

## 2017-10-12 DIAGNOSIS — F0390 Unspecified dementia without behavioral disturbance: Secondary | ICD-10-CM

## 2017-10-12 DIAGNOSIS — R748 Abnormal levels of other serum enzymes: Secondary | ICD-10-CM

## 2017-10-12 DIAGNOSIS — D72829 Elevated white blood cell count, unspecified: Secondary | ICD-10-CM

## 2017-10-12 DIAGNOSIS — T796XXD Traumatic ischemia of muscle, subsequent encounter: Secondary | ICD-10-CM

## 2017-10-12 DIAGNOSIS — F4310 Post-traumatic stress disorder, unspecified: Secondary | ICD-10-CM

## 2017-10-12 LAB — GLUCOSE, CAPILLARY: GLUCOSE-CAPILLARY: 85 mg/dL (ref 65–99)

## 2017-10-12 NOTE — Progress Notes (Signed)
   Daily Progress Note   CTA Neck reviewed again with Dr. Randie Heinzain.  R ICA lesion is not compatible with CAS or TCAR.     Will let pt recover over the next month and re-evaluation in one month for possible R CEA  Will need pre-operative cardiac risk stratification prior to proceeding.  My office will arrange follow up  Leonides SakeBrian Daved Mcfann, MD, FACS [facility] [place] Office: [phone] Pager: [phone]  10/12/2017, 10:50 AM

## 2017-10-12 NOTE — Progress Notes (Signed)
PROGRESS NOTE    Samuel Vega  XLK:[id number] DOB: 03/07/1924 DOA: 10/08/2017 PCP: Porfirio OarJeffery, Chelle, PA-C    Brief Narrative:  84 y.o. male with history of severe aortic stenosis, PTSD presently not on any medications was brought to the ER after patient was found on the floor, presenting with right parietal stroke and MI.  Per cardiology they do not anticipate any cardiac intervention given recent stroke. Neurology on board and have requested vascular surgery consult which has been done.  Assessment & Plan:   Principal Problem:   Cerebrovascular accident (CVA) due to embolism of right anterior cerebral artery (HCC)  - Severe RICA stenosis vs cardioembolic: Patient presented with left-sided deficits - Holding statin secondary to elevated LFTs plan is to start statin on discharge once LFTs normalize  Active Problems:   Aortic stenosis   PTSD (post-traumatic stress disorder) -Stable    Elevated troponin - discussed with cardiology who would like to have patient evaluated by specialists for evaluation of critical aortic stenosis. Plan will be for him to follow-up with specialist within 2 weeks for further evaluation as outpatient.  Rhabdomyolysis -resolving with fluids. Have discontinued.     Syncope - Most likely secondary to principal problems stroke/MI    Carotid stenosis, right - Patient found to have right ICA stenosis 70-80% on CTA - Consulted vascular surgeon and plan is to reevaluate in one month - No operation plan at this moment and plan is to optimize medical management.   DVT prophylaxis: Lovenox Code Status: Full Family Communication: none at bedside. Neurologist updated family per his notes. Disposition Plan: Plan is for CIR evaluation. If not candidate will transition SNF most likely next a.m.   Consultants:   Vascular surgery  Cardiology  Neurology  Procedures: BP: (!) 137/98 (!) 108/52 (!) 137/54 115/86  Pulse: 83 (!) 57 62 80  Resp: 16 18 17  17   Temp: 97.9 F (36.6 C) 97.9 F (36.6 C) 97.6 F (36.4 C) (!) 97.5 F (36.4 C)  TempSrc: Oral Oral Oral Oral  SpO2: (!) 78% 100% 99% 97%  Weight:      Height:       No intake or output data in the 24 hours ending 10/10/17 1319 Filed Weights   10/08/17 2026  Weight: 81.6 kg (180 lb)    Examination: Exam unchanged when compared to prior on 10/11/2017  General exam: Appears calm and comfortable, in nad. Respiratory system: Clear to auscultation. Respiratory effort normal. Cardiovascular system: S1 & S2 heard, RRR. No JVD, murmurs, rubs, gallops  Gastrointestinal system: Abdomen is nondistended, soft and nontender. No organomegaly or masses felt. Normal bowel sounds heard. Central nervous system: Alert and oriented. No focal neurological deficits. Extremities: Left leg weakness when compared to the right, answers questions appropriatelyappropriately Skin: bruising left side of the face, no ulcers on limited exam. Psychiatry: Mood & affect appropriate.    Data Reviewed: I have personally reviewed following labs and imaging studies  CBC: Recent Labs  Lab 10/08/17 2226 10/09/17 0456  WBC 15.9* 13.6*  NEUTROABS 13.7* 10.9*  HGB 14.0 12.9*  HCT 42.9 39.4  MCV 83.8 83.5  PLT 180 177   Basic Metabolic Panel: Recent Labs  Lab 10/08/17 2226 10/09/17 0456  NA 144 143  K 4.1 4.0  CL 108 108  CO2 22 21*  GLUCOSE 117* 119*  BUN 25* 29*  CREATININE 1.01 1.02  CALCIUM 9.3 8.8*   GFR: Estimated Creatinine Clearance: 45.6 mL/min (by C-G formula based on SCr of  1.02 mg/dL). Liver Function Tests: Recent Labs  Lab 10/08/17 2226 10/09/17 0456  AST 169* 157*  ALT 45 43  ALKPHOS 77 69  BILITOT 1.1 0.9  PROT 7.2 6.2*  ALBUMIN 3.5 3.2*   No results for input(s): LIPASE, AMYLASE in the last 168 hours. No results for input(s): AMMONIA in the last 168 hours. Coagulation Profile: No results for input(s): INR, PROTIME in the last 168 hours. Cardiac Enzymes: Recent Labs    Lab 10/08/17 2226 10/08/17 2315 10/09/17 0456 10/09/17 1032 10/09/17 1538  CKTOTAL 3,971*  --  4,000*  --   --   TROPONINI  --  1.71* 1.71* 1.34* 1.22*   BNP (last 3 results) No results for input(s): PROBNP in the last 8760 hours. HbA1C: Recent Labs    10/09/17 0456  HGBA1C 5.2   CBG: No results for input(s): GLUCAP in the last 168 hours. Lipid Profile: Recent Labs    10/09/17 0456  CHOL 235*  HDL 96  LDLCALC 122*  TRIG 87  CHOLHDL 2.4   Thyroid Function Tests: No results for input(s): TSH, T4TOTAL, FREET4, T3FREE, THYROIDAB in the last 72 hours. Anemia Panel: No results for input(s): VITAMINB12, FOLATE, FERRITIN, TIBC, IRON, RETICCTPCT in the last 72 hours. Sepsis Labs: No results for input(s): PROCALCITON, LATICACIDVEN in the last 168 hours.  No results found for this or any previous visit (from the past 240 hour(s)).       Radiology Studies: Ct Angio Head W Or Wo Contrast  Result Date: 10/09/2017 CLINICAL DATA:  Stroke workup.  Patient found on floor. EXAM: CT ANGIOGRAPHY HEAD AND NECK TECHNIQUE: Multidetector CT imaging of the head and neck was performed using the standard protocol during bolus administration of intravenous contrast. Multiplanar CT image reconstructions and MIPs were obtained to evaluate the vascular anatomy. Carotid stenosis measurements (when applicable) are obtained utilizing NASCET criteria, using the distal internal carotid diameter as the denominator. CONTRAST:  50mL ISOVUE-370 IOPAMIDOL (ISOVUE-370) INJECTION 76% COMPARISON:  Brain MRI from earlier today. FINDINGS: CT HEAD FINDINGS Brain: Right parietal low-density as characterized by MRI earlier today. No evidence of progression. No hemorrhage or hydrocephalus. Brain volume is excellent for age. There is a small right frontal hygroma without significant mass effect. Vascular: See below Skull: Questionable scalp swelling along the left parietal bone posteriorly. Negative for fracture. Sinuses:  No acute finding.  Anterior nasal septum perforation. Orbits: Bilateral cataract resection. Review of the MIP images confirms the above findings CTA NECK FINDINGS Aortic arch: Mild atheromatous plaque.  Three vessel branching. Right carotid system: Advanced calcified and noncalcified plaque at the common carotid bifurcation and ICA bulb with 70-80% stenosis. No dissection or beading. Left carotid system: Moderate calcified and noncalcified plaque at the common carotid bifurcation without ulceration or flow limiting stenosis. Vertebral arteries: Proximal subclavian mild atherosclerosis. Right dominant vertebral artery. There is mild narrowing at the left vertebral origin. No beading or dissection. Skeleton: Diffuse cervical disc degeneration with narrowing reversal of lordosis. There is upper cervical facet arthropathy with C2-3 anterolisthesis. Other neck:  No acute or aggressive finding. Upper chest: Negative Review of the MIP images confirms the above findings CTA HEAD FINDINGS Anterior circulation: Atherosclerotic plaque on the carotid siphons without flow limiting stenosis. The left ICA is smaller than the right in the setting of hypoplastic left A1 segment. No visible branch occlusion or flow limiting stenosis. Negative for aneurysm. On MIP images there is prominent vessels about the right parietal convexity. Posterior circulation: Vertebrobasilar arteries are smooth and diffusely  patent. Moderate atheromatous narrowing of the distal left P2 segment. Venous sinuses: Patent Anatomic variants: None significant Delayed phase: Mild cortical enhancement at the level of right parietal abnormality which is nonspecific but would be supportive of a subacute infarct. Review of the MIP images confirms the above findings IMPRESSION: 1. 70-80% atheromatous narrowing at the right ICA bulb. 2. No other significant stenosis in the neck. 3. Intracranial atherosclerosis without proximal or correctable stenosis. There is moderate  atheromatous narrowing of the distal left P2 segment. 4. Presumed infarct in the right parietal lobe (see brain MRI report). Mild associated enhancement would correlate with subacute timing. Electronically Signed   By: Marnee Spring M.D.   On: 10/09/2017 10:59   Dg Chest 2 View  Result Date: 10/08/2017 CLINICAL DATA:  Fall EXAM: CHEST  2 VIEW COMPARISON:  None. FINDINGS: Mild cardiomegaly. Bibasilar atelectasis. No effusions. No edema. No acute bony abnormality. IMPRESSION: Mild cardiomegaly.  Bibasilar atelectasis. Electronically Signed   By: Charlett Nose M.D.   On: 10/08/2017 23:17   Dg Pelvis 1-2 Views  Result Date: 10/09/2017 CLINICAL DATA:  84 year old male with fall and left hip pain. EXAM: PELVIS - 1-2 VIEW COMPARISON:  None. FINDINGS: There is no acute fracture or dislocation. The bones are osteopenic. Mild arthritic changes. There is degenerative changes of the visualized lower lumbar spine. The soft tissues appear unremarkable. Vascular calcification noted. IMPRESSION: No acute fracture or dislocation. Electronically Signed   By: Elgie Collard M.D.   On: 10/09/2017 05:33   Dg Elbow 2 Views Left  Result Date: 10/09/2017 CLINICAL DATA:  84 year old male with fall and left elbow pain. EXAM: LEFT ELBOW - 2 VIEW COMPARISON:  None. FINDINGS: There is no acute fracture or dislocation. Mild osteopenia. No arthritic changes. The soft tissues appear unremarkable. No joint effusion. IMPRESSION: Negative. Electronically Signed   By: Elgie Collard M.D.   On: 10/09/2017 05:34   Ct Head Wo Contrast  Result Date: 10/08/2017 CLINICAL DATA:  Found on floor.  Bruising to left side of face EXAM: CT HEAD WITHOUT CONTRAST CT CERVICAL SPINE WITHOUT CONTRAST TECHNIQUE: Multidetector CT imaging of the head and cervical spine was performed following the standard protocol without intravenous contrast. Multiplanar CT image reconstructions of the cervical spine were also generated. COMPARISON:  None. FINDINGS: CT  HEAD FINDINGS Brain: Area of low-density noted in the posteromedial right parietal lobe concerning for acute infarct. There is atrophy and chronic small vessel disease changes. No hemorrhage or hydrocephalus. Vascular: No hyperdense vessel or unexpected calcification. Skull: No acute calvarial abnormality. Sinuses/Orbits: No acute findings Other: None CT CERVICAL SPINE FINDINGS Alignment: No subluxation. Skull base and vertebrae: No fracture Soft tissues and spinal canal: No prevertebral fluid or swelling. No visible canal hematoma. Disc levels:  Diffuse degenerative disc and facet disease. Upper chest: No acute findings Other: No acute findings IMPRESSION: Subtle low-density in the posteromedial high right parietal lobe concerning for acute infarction. No hemorrhage. Diffuse degenerative disc and facet disease in the cervical spine. No acute bony abnormality. Electronically Signed   By: Charlett Nose M.D.   On: 10/08/2017 23:33   Ct Angio Neck W Or Wo Contrast  Result Date: 10/09/2017 CLINICAL DATA:  Stroke workup.  Patient found on floor. EXAM: CT ANGIOGRAPHY HEAD AND NECK TECHNIQUE: Multidetector CT imaging of the head and neck was performed using the standard protocol during bolus administration of intravenous contrast. Multiplanar CT image reconstructions and MIPs were obtained to evaluate the vascular anatomy. Carotid stenosis measurements (when applicable)  are obtained utilizing NASCET criteria, using the distal internal carotid diameter as the denominator. CONTRAST:  50mL ISOVUE-370 IOPAMIDOL (ISOVUE-370) INJECTION 76% COMPARISON:  Brain MRI from earlier today. FINDINGS: CT HEAD FINDINGS Brain: Right parietal low-density as characterized by MRI earlier today. No evidence of progression. No hemorrhage or hydrocephalus. Brain volume is excellent for age. There is a small right frontal hygroma without significant mass effect. Vascular: See below Skull: Questionable scalp swelling along the left parietal bone  posteriorly. Negative for fracture. Sinuses: No acute finding.  Anterior nasal septum perforation. Orbits: Bilateral cataract resection. Review of the MIP images confirms the above findings CTA NECK FINDINGS Aortic arch: Mild atheromatous plaque.  Three vessel branching. Right carotid system: Advanced calcified and noncalcified plaque at the common carotid bifurcation and ICA bulb with 70-80% stenosis. No dissection or beading. Left carotid system: Moderate calcified and noncalcified plaque at the common carotid bifurcation without ulceration or flow limiting stenosis. Vertebral arteries: Proximal subclavian mild atherosclerosis. Right dominant vertebral artery. There is mild narrowing at the left vertebral origin. No beading or dissection. Skeleton: Diffuse cervical disc degeneration with narrowing reversal of lordosis. There is upper cervical facet arthropathy with C2-3 anterolisthesis. Other neck:  No acute or aggressive finding. Upper chest: Negative Review of the MIP images confirms the above findings CTA HEAD FINDINGS Anterior circulation: Atherosclerotic plaque on the carotid siphons without flow limiting stenosis. The left ICA is smaller than the right in the setting of hypoplastic left A1 segment. No visible branch occlusion or flow limiting stenosis. Negative for aneurysm. On MIP images there is prominent vessels about the right parietal convexity. Posterior circulation: Vertebrobasilar arteries are smooth and diffusely patent. Moderate atheromatous narrowing of the distal left P2 segment. Venous sinuses: Patent Anatomic variants: None significant Delayed phase: Mild cortical enhancement at the level of right parietal abnormality which is nonspecific but would be supportive of a subacute infarct. Review of the MIP images confirms the above findings IMPRESSION: 1. 70-80% atheromatous narrowing at the right ICA bulb. 2. No other significant stenosis in the neck. 3. Intracranial atherosclerosis without  proximal or correctable stenosis. There is moderate atheromatous narrowing of the distal left P2 segment. 4. Presumed infarct in the right parietal lobe (see brain MRI report). Mild associated enhancement would correlate with subacute timing. Electronically Signed   By: Marnee Spring M.D.   On: 10/09/2017 10:59   Ct Cervical Spine Wo Contrast  Result Date: 10/08/2017 CLINICAL DATA:  Found on floor.  Bruising to left side of face EXAM: CT HEAD WITHOUT CONTRAST CT CERVICAL SPINE WITHOUT CONTRAST TECHNIQUE: Multidetector CT imaging of the head and cervical spine was performed following the standard protocol without intravenous contrast. Multiplanar CT image reconstructions of the cervical spine were also generated. COMPARISON:  None. FINDINGS: CT HEAD FINDINGS Brain: Area of low-density noted in the posteromedial right parietal lobe concerning for acute infarct. There is atrophy and chronic small vessel disease changes. No hemorrhage or hydrocephalus. Vascular: No hyperdense vessel or unexpected calcification. Skull: No acute calvarial abnormality. Sinuses/Orbits: No acute findings Other: None CT CERVICAL SPINE FINDINGS Alignment: No subluxation. Skull base and vertebrae: No fracture Soft tissues and spinal canal: No prevertebral fluid or swelling. No visible canal hematoma. Disc levels:  Diffuse degenerative disc and facet disease. Upper chest: No acute findings Other: No acute findings IMPRESSION: Subtle low-density in the posteromedial high right parietal lobe concerning for acute infarction. No hemorrhage. Diffuse degenerative disc and facet disease in the cervical spine. No acute bony abnormality. Electronically Signed  By: Charlett Nose M.D.   On: 10/08/2017 23:33   Mr Brain Wo Contrast  Result Date: 10/09/2017 CLINICAL DATA:  84 y/o  M; possible syncope. EXAM: MRI HEAD WITHOUT CONTRAST TECHNIQUE: Multiplanar, multiecho pulse sequences of the brain and surrounding structures were obtained without  intravenous contrast. COMPARISON:  10/08/2017 CT head. FINDINGS: Brain: 3.8 x 3.9 x 4.6 cm (volume = 36 cm^3) region of predominantly cortical diffusion hyperintensity which demonstrates both areas of reduced, intermediate, and increased diffusion on ADC (see series 3, image 38 and series 350, image 38). The region is associated with increased T2 FLAIR hyperintense signal abnormality and minimal gyral swelling. No focus of reduced diffusion to suggest acute or early subacute infarction. No abnormal susceptibility hypointensity to indicate intracranial hemorrhage. Severalnonspecific foci of T2 FLAIR hyperintense signal abnormality in subcortical and periventricular white matter are compatible withmildchronic microvascular ischemic changes for age. Mildbrain parenchymal volume loss. There is displacement of cortical veins away from the calvarium over the right cerebral cortex with CSF signal (series 10, image 15 and series 6, image 16) which may represent a small hygroma. No midline shift. Vascular: Normal flow voids. Skull and upper cervical spine: Normal marrow signal. Sinuses/Orbits: Negative. Other: None. IMPRESSION: 1. Right superior and medial parietal lobe 36 cc area of predominantly cortical diffusion signal abnormality demonstrating areas of both reduced and increased diffusion. Findings probably represents a subacute infarction, but cerebritis or seizure activity should also be considered. No hemorrhage or mass effect. 2. Mild chronic microvascular ischemic changes and mild parenchymal volume loss of the brain. 3. Displacement of cortical veins over the right cerebral convexity from the calvarium, possibly small hygroma. No significant mass effect on the brain. Electronically Signed   By: Mitzi Hansen M.D.   On: 10/09/2017 04:03   Dg Hand Complete Left  Result Date: 10/08/2017 CLINICAL DATA:  Fall.  Right hand pain EXAM: LEFT HAND - COMPLETE 3+ VIEW COMPARISON:  None. FINDINGS: No acute bony  abnormality. Specifically, no fracture, subluxation, or dislocation. IMPRESSION: No acute bony abnormality. Electronically Signed   By: Charlett Nose M.D.   On: 10/08/2017 23:21   Mr Maxine Glenn Head Wo Contrast  Result Date: 10/09/2017 CLINICAL DATA:  84 y/o  M; evaluation for stroke. EXAM: MRA HEAD WITHOUT CONTRAST TECHNIQUE: Angiographic images of the Circle of Willis were obtained using MRA technique without intravenous contrast. COMPARISON:  10/09/2017 MRI head. FINDINGS: Internal carotid arteries: Patent. Mild lumen irregularity without significant stenosis compatible with atherosclerosis. Anterior cerebral arteries:  Patent. Middle cerebral arteries: Patent. Anterior communicating artery: Patent. Posterior communicating arteries: Patent. Left P2 short segment of moderate stenosis. Posterior cerebral arteries:  Patent. Basilar artery:  Patent. Vertebral arteries:  Patent. No aneurysm or large vessel occlusion identified. Right ACA ACA distributions demonstrate increased flow related signal, probably hyperemia. IMPRESSION: Patent anterior and posterior intracranial circulation. No large vessel occlusion or aneurysm. Increase flow related signal within right ACA and MCA distributions, probably hyperemia given right parietal abnormality. Electronically Signed   By: Mitzi Hansen M.D.   On: 10/09/2017 06:11   Dg Femur Min 2 Views Left  Result Date: 10/09/2017 CLINICAL DATA:  84 year old male with fall and left hip pain. EXAM: LEFT FEMUR 2 VIEWS COMPARISON:  Pelvic radiograph dated 10/09/2017 FINDINGS: There is no acute fracture or dislocation. Mild osteopenia. No significant arthritic changes. Vascular calcifications noted. The soft tissues appear unremarkable. IMPRESSION: Negative. Electronically Signed   By: Elgie Collard M.D.   On: 10/09/2017 05:36    Scheduled Meds: .  stroke: mapping our early stages of recovery book   Does not apply Once  . aspirin  300 mg Rectal Daily   Or  . aspirin  325  mg Oral Daily  . enoxaparin (LOVENOX) injection  40 mg Subcutaneous Q24H   Continuous Infusions:   LOS: 1 day   Time spent: > 35 minutes  Penny Pia, MD [facility] Pager [phone]  If 7PM-7AM, please contact night-coverage [email or web address] Password [email or web address] 10/10/2017, 1:19 PM

## 2017-10-12 NOTE — Progress Notes (Addendum)
Inpatient Rehabilitation  Patient was screened by Fae PippinMelissa Nakyia Dau for appropriateness for an Inpatient Acute Rehab consult.  At this time note that OT is recommending SNF and PT is recommending IP Rehab.  If patient wants to be considered for IP Rehab then recommend placing an order.  Anticipate that patient will need assist upon return home.  Text paged Dr. Cena BentonVega to notify; order if appropriate.  Thanks.  Charlane FerrettiMelissa Tajai Ihde, M.A., CCC/SLP Admission Coordinator  [facility]  Cell [phone]

## 2017-10-12 NOTE — Progress Notes (Signed)
Physical Therapy Treatment Patient Details Name: ZAYAAN KOZAK MRN: [id number] DOB: January 17, 1924 Today's Date: 10/12/2017    History of Present Illness ENOS MUHL is a 84 y.o. male with history of severe aortic stenosis, PTSD presently not on any medications was brought to the ER after patient was found on the floor, presenting with right parietal stroke and MI.    PT Comments    Pt with significant improvement from initial eval. Pt now able to complete active LAQ on L LE and participate in ambulation. Pt remains to have L sided weakness, L sided neglect, and impaired balance. Pt able to follow commands and very motivated to return to indep. Pt to strongly benefit from CIR upon d/c to maximize functional return for safe transition home.    Follow Up Recommendations  CIR     Equipment Recommendations       Recommendations for Other Services       Precautions / Restrictions Precautions Precautions: Fall Precaution Comments: bruising on L side of face Restrictions Weight Bearing Restrictions: No    Mobility  Bed Mobility               General bed mobility comments: pt up in chair upon PT arrival  Transfers Overall transfer level: Needs assistance Equipment used: Rolling walker (2 wheeled) Transfers: Sit to/from [employer] Sit to Stand: Mod assist         General transfer comment: max v/c's to push up from arm rests and to use L UE. pt with impaired sensation   Ambulation/Gait Ambulation/Gait assistance: Mod assist;+2 physical assistance Ambulation Distance (Feet): 40 Feet(x2, seated rest break) Assistive device: Rolling walker (2 wheeled) Gait Pattern/deviations: Step-through pattern;Decreased stride length;Decreased step length - left;Decreased stance time - right;Decreased dorsiflexion - left;Decreased weight shift to right;Shuffle;Trunk flexed Gait velocity: slow   General Gait Details: pt with L foot externally rotated, pt  requiring max directional v/c's for sequencing and for weight shifting to the R to step through with the L. Pt with minimal L foot clearance, worsening with fatigue   Stairs            Wheelchair Mobility    Modified Rankin (Stroke Patients Only) Modified Rankin (Stroke Patients Only) Pre-Morbid Rankin Score: No significant disability Modified Rankin: Moderately severe disability     Balance Overall balance assessment: Needs assistance Sitting-balance support: Feet supported;No upper extremity supported Sitting balance-Leahy Scale: Fair     Standing balance support: Bilateral upper extremity supported Standing balance-Leahy Scale: Poor Standing balance comment: dependent on physical assist and UE support                            Cognition Arousal/Alertness: Awake/alert Behavior During Therapy: WFL for tasks assessed/performed Overall Cognitive Status: Impaired/Different from baseline Area of Impairment: Following commands;Problem solving;Safety/judgement                   Current Attention Level: Sustained   Following Commands: Follows one step commands with increased time;Follows multi-step commands inconsistently Safety/Judgement: Decreased awareness of deficits(L sided neglect)   Problem Solving: Difficulty sequencing General Comments: pt emotional regarding being able to kick out L LE and walk      Exercises General Exercises - Lower Extremity Long Arc Quad: AROM;Left;10 reps;Seated Hip Flexion/Marching: AROM;10 reps;Seated;Left    General Comments        Pertinent Vitals/Pain Pain Assessment: No/denies pain    Home Living  Prior Function            PT Goals (current goals can now be found in the care plan section) Acute Rehab PT Goals Patient Stated Goal: go home Progress towards PT goals: Progressing toward goals    Frequency    Min 4X/week      PT Plan Discharge plan needs to be  updated    Co-evaluation              AM-PAC PT "6 Clicks" Daily Activity  Outcome Measure  Difficulty turning over in bed (including adjusting bedclothes, sheets and blankets)?: Unable Difficulty moving from lying on back to sitting on the side of the bed? : Unable Difficulty sitting down on and standing up from a chair with arms (e.g., wheelchair, bedside commode, etc,.)?: Unable Help needed moving to and from a bed to chair (including a wheelchair)?: A Lot Help needed walking in hospital room?: A Lot Help needed climbing 3-5 steps with a railing? : A Lot 6 Click Score: 9    End of Session Equipment Utilized During Treatment: Gait belt Activity Tolerance: Patient tolerated treatment well Patient left: in chair;with call bell/phone within reach;with chair alarm set Nurse Communication: Mobility status PT Visit Diagnosis: Other abnormalities of gait and mobility (R26.89);Ataxic gait (R26.0);Other symptoms and signs involving the nervous system (R29.898);History of falling (Z91.81);Unsteadiness on feet (R26.81);Hemiplegia and hemiparesis Hemiplegia - Right/Left: Left Hemiplegia - dominant/non-dominant: Non-dominant Hemiplegia - caused by: Cerebral infarction     Time: 0911-0938 PT Time Calculation (min) (ACUTE ONLY): 27 min  Charges:  $Gait Training: 8-22 mins $Therapeutic Exercise: 8-22 mins                    G Codes:       Lewis ShockAshly Jamerica Snavely, PT, DPT Pager #: [phone] Office #: [phone]    Shannie Kontos M Elimelech Houseman 10/12/2017, 11:25 AM

## 2017-10-12 NOTE — Care Management Note (Signed)
Case Management Note  Patient Details  Name: Samuel Vega MRN: [id number] Date of Birth: 12/12/1923  Subjective/Objective:   Pt found down at home and was admitted with CVA and MI. He is from home alone.                 Action/Plan: PT recommending CIR. OT recommending SNF. CM following for d/c disposition.  Expected Discharge Date:                  Expected Discharge Plan:  IP Rehab Facility  In-House Referral:  Clinical Social Work  Discharge planning Services  CM Consult  Post Acute Care Choice:    Choice offered to:     DME Arranged:    DME Agency:     HH Arranged:    HH Agency:     Status of Service:  In process, will continue to follow  If discussed at Long Length of Stay Meetings, dates discussed:    Additional Comments:  Kermit BaloKelli F Blakleigh Straw, RN 10/12/2017, 11:41 AM
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
## 2017-10-13 ENCOUNTER — Other Ambulatory Visit: Payer: Self-pay | Admitting: *Deleted

## 2017-10-13 ENCOUNTER — Telehealth: Payer: Self-pay | Admitting: Nurse Practitioner

## 2017-10-13 DIAGNOSIS — I63421 Cerebral infarction due to embolism of right anterior cerebral artery: Principal | ICD-10-CM

## 2017-10-13 DIAGNOSIS — I639 Cerebral infarction, unspecified: Secondary | ICD-10-CM

## 2017-10-13 LAB — COMPREHENSIVE METABOLIC PANEL
ALBUMIN: 2.7 g/dL — AB (ref 3.5–5.0)
ALT: 38 U/L (ref 17–63)
ANION GAP: 8 (ref 5–15)
AST: 67 U/L — ABNORMAL HIGH (ref 15–41)
Alkaline Phosphatase: 64 U/L (ref 38–126)
BUN: 15 mg/dL (ref 6–20)
CHLORIDE: 107 mmol/L (ref 101–111)
CO2: 26 mmol/L (ref 22–32)
Calcium: 8.5 mg/dL — ABNORMAL LOW (ref 8.9–10.3)
Creatinine, Ser: 0.9 mg/dL (ref 0.61–1.24)
GFR calc non Af Amer: >60 mL/min (ref >60)
GLUCOSE: 97 mg/dL (ref 65–99)
POTASSIUM: 3.9 mmol/L (ref 3.5–5.1)
SODIUM: 141 mmol/L (ref 135–145)
Total Bilirubin: 0.5 mg/dL (ref 0.3–1.2)
Total Protein: 5.8 g/dL — ABNORMAL LOW (ref 6.5–8.1)

## 2017-10-13 MED ORDER — ASPIRIN 325 MG PO TABS: 325.0000 mg | ORAL_TABLET | Freq: Every day | ORAL | 0 refills | Status: DC

## 2017-10-13 MED ORDER — DIAZEPAM 2 MG PO TABS
2.0000 mg | ORAL_TABLET | Freq: Two times a day (BID) | ORAL | Status: DC | PRN
  Administered 2017-10-13: 2 mg via ORAL
  Filled 2017-10-13: qty 1

## 2017-10-13 NOTE — Clinical Social Work Note (Signed)
Clinical Social Work Assessment  Patient Details  Name: Samuel Vega MRN: [id number] Date of Birth: [birth year]  Date of referral:  10/12/17               Reason for consult:  Facility Placement                Permission sought to share information with:  Facility Sport and exercise psychologist, Family Supports Permission granted to share information::  Yes, Verbal Permission Granted  Name::     [name], [name], [name]  Agency::  SNF  Relationship::  Family members  Contact Information:     Housing/Transportation Living arrangements for the past 2 months:  Single Family Home Source of Information:  Patient, Adult Children, Other (Comment Required) Patient Interpreter Needed:  None Criminal Activity/Legal Involvement Pertinent to Current Situation/Hospitalization:  No - Comment as needed Significant Relationships:  Adult Children, Other Family Members Lives with:  Self Do you feel safe going back to the place where you live?  Yes Need for family participation in patient care:  No (Coment)  Care giving concerns:  Patient lives home alone and will benefit from short term rehab at discharge.    Social Worker assessment / plan:  CSW met with patient and patient's family (nephew [name] and DIL [name]) to discuss recommendation for SNF. CSW explained SNF placement and referral process and insurance benefits. CSW also discussed potential for CIR placement but that the patient will need to locate 24 hour assistance upon return home, which is not likely. CSW to follow and fax out referral for placement.  Employment status:  Retired Nurse, adult PT Recommendations:  [place] / Referral to community resources:  [place]  Patient/Family's Response to care:  Patient agreeable to go to SNF at discharge.  Patient/Family's Understanding of and Emotional Response to Diagnosis, Current Treatment, and Prognosis:   Patient and family discussed how the patient can't go home the way that he is right now and that he will need rehab prior to returning home. Patient would still like to be home when he finishes rehab, but the family isn't so sure if that would be feasible or not as he has been making some poor cognitive judgment calls lately, including letting someone borrow his credit card and then charging $2,000 worth of purchases on it. However, per daughter in law, the family is kind of estranged and not very involved with each other (they don't even have one of the son's phone numbers on them to provide to [place] to call) so they will not be able to step in and help take care of the patient in the future.  Emotional Assessment Appearance:  Appears stated age Attitude/Demeanor/Rapport:  Engaged Affect (typically observed):  Pleasant, Happy Orientation:  Oriented to Self, Oriented to Place, Oriented to  Time, Oriented to Situation Alcohol / Substance use:  Not Applicable Psych involvement (Current and /or in the community):  No (Comment)  Discharge Needs  Concerns to be addressed:  Care Coordination Readmission within the last 30 days:  No Current discharge risk:  Lives alone, Dependent with Mobility Barriers to Discharge:  Continued Medical Work up, [place], [place] 10/13/2017, 12:30 PM

## 2017-10-13 NOTE — Progress Notes (Signed)
Progress Note  Patient Name: Samuel NorwayWilliam T Whittinghill Date of Encounter: 10/13/2017  Primary Cardiologist: No primary care provider on file.   Subjective   Feeling well.  No chest pain or shortness of breath.  Frustrated that he cannot feel his L foot.    Inpatient Medications    Scheduled Meds: .  stroke: mapping our early stages of recovery book   Does not apply Once  . aspirin  300 mg Rectal Daily   Or  . aspirin  325 mg Oral Daily  . enoxaparin (LOVENOX) injection  40 mg Subcutaneous Q24H   Continuous Infusions:  PRN Meds: acetaminophen **OR** acetaminophen (TYLENOL) oral liquid 160 mg/5 mL **OR** acetaminophen   Vital Signs    Vitals:   10/12/17 1600 10/12/17 2008 10/12/17 2351 10/13/17 0445  BP: 128/88 139/77 (!) 141/54 (!) 148/75  Pulse: 74 65 66 68  Resp: 16 16 16 16   Temp: 98.2 F (36.8 C) 98 F (36.7 C) 97.6 F (36.4 C) 98.1 F (36.7 C)  TempSrc: Oral Oral Oral Oral  SpO2: 98% 99% 96% 96%  Weight:      Height:        Intake/Output Summary (Last 24 hours) at 10/13/2017 0920 Last data filed at 10/13/2017 0900 Gross per 24 hour  Intake 240 ml  Output 340 ml  Net -100 ml   Filed Weights   10/08/17 2026  Weight: 180 lb (81.6 kg)    Telemetry    Sinus rhythm  PACs/PVCs - Personally Reviewed  ECG    n/a - Personally Reviewed  Physical Exam   VS:  BP (!) 148/75 (BP Location: Left Arm) Comment: [id number]  Pulse 68   Temp 98.1 F (36.7 C) (Oral)   Resp 16   Ht 5\' 8"  (1.727 m)   Wt 180 lb (81.6 kg)   SpO2 96%   BMI 27.37 kg/m  , BMI Body mass index is 27.37 kg/m. GENERAL:  Well appearing.   HEENT: Pupils equal round and reactive, fundi not visualized, oral mucosa unremarkable.  Ecchymoses on left side of fact NECK:  No jugular venous distention, waveform within normal limits, carotid upstroke brisk and symmetric LUNGS:  Clear to auscultation bilaterally.  No crackles, wheezes or rhonchi HEART:  RRR.  PMI not displaced or sustained,S1 and S2 within  normal limits, no S3, no S4, no clicks, no rubs, III/VI late-peaking systolic murmur at the LUSB ABD:  Flat, positive bowel sounds normal in frequency in pitch, no bruits, no rebound, no guarding, no midline pulsatile mass, no hepatomegaly, no splenomegaly EXT:  2 plus pulses throughout, no edema, no cyanosis no clubbing SKIN:  No rashes no nodules NEURO:  Cranial nerves II through XII grossly intact, motor grossly intact throughout.  L sided weakness PSYCH:  Cognitively intact, oriented to person place and time   Labs    Chemistry Recent Labs  Lab 10/08/17 2226 10/09/17 0456  NA 144 143  K 4.1 4.0  CL 108 108  CO2 22 21*  GLUCOSE 117* 119*  BUN 25* 29*  CREATININE 1.01 1.02  CALCIUM 9.3 8.8*  PROT 7.2 6.2*  ALBUMIN 3.5 3.2*  AST 169* 157*  ALT 45 43  ALKPHOS 77 69  BILITOT 1.1 0.9  GFRNONAA >60 >60  GFRAA >60 >60  ANIONGAP 14 14     Hematology Recent Labs  Lab 10/08/17 2226 10/09/17 0456  WBC 15.9* 13.6*  RBC 5.12 4.72  HGB 14.0 12.9*  HCT 42.9 39.4  MCV 83.8 83.5  MCH 27.3 27.3  MCHC 32.6 32.7  RDW 14.5 14.5  PLT 180 177    Cardiac Enzymes Recent Labs  Lab 10/08/17 2315 10/09/17 0456 10/09/17 1032 10/09/17 1538  TROPONINI 1.71* 1.71* 1.34* 1.22*   No results for input(s): TROPIPOC in the last 168 hours.   BNPNo results for input(s): BNP, PROBNP in the last 168 hours.   DDimer No results for input(s): DDIMER in the last 168 hours.   Radiology    No results found.  Cardiac Studies   Echo 10/10/17: Study Conclusions  - Left ventricle: The cavity size was normal. Wall thickness was   increased in a pattern of moderate LVH. Systolic function was   normal. The estimated ejection fraction was in the range of 60%   to 65%. Wall motion was normal; there were no regional wall   motion abnormalities. Doppler parameters are consistent with   abnormal left ventricular relaxation (grade 1 diastolic   dysfunction). - Aortic valve: Valve mobility was  moderately restricted.   Transvalvular velocity was increased. There was critical   stenosis. Valve area (VTI): 0.41 cm^2. Valve area (Vmax): 0.43   cm^2. Valve area (Vmean): 0.4 cm^2. - Mitral valve: Mildly calcified annulus.  Patient Profile     84 y.o. male with critical aortic stenosis, hypertension, hyperlipidemia, and PTSD admitted after being found down and noted to have R parietal stroke and elevated troponin.  Assessment & Plan    # Critical aortic stenosis: Mean gradient 71 mmHg.  He had known severe AS and was lost to follow up.  Vascular requesting full cardiac work up prior to considering carotid endarterectomy.  He will be seen by Dr. Sanjuana Kava in 1 week to consider TAVR.  He is very high risk to undergo CEA without fixing his aortic stenosis.  He was doing well clinically until this stroke with no chest pain, dizziness or shortness of breath.  He was living alone, driving, and performing all ADLs independently.  There is concern for dementia and possibly being taken advantage by a neighbor.  He continues to ask good and appropriate questions.    # R parietal stroke:  R ICA stenosis 70-80%. Not amenable to percutaneous intervention. He will need cardiac evaluation prior as above.  Per neuro 30 day event monitor at discharge.  We will help arrange this.  Antiplatelets vs anticoagulation per neurology.  Statin after LFTs resolve.  Will check CMP today.  # Elevated troponin: Peaked at 1.71.  Normal systolic function on echo.  Likely demand ischemia.  No plans for intervention at this time given his stroke on presentation and no stress given critical AS.  # Rhabdomyolysis:  CK improved to 1207 on 3/4 from 4000 the preceding day.    We will sign off.  Please call if there are questions.   For questions or updates, please contact [facility] Please consult [email or web address] for contact info under Cardiology/STEMI.      Signed, Chilton Si, MD  10/13/2017, 9:20 AM

## 2017-10-13 NOTE — Progress Notes (Signed)
CSW following for discharge plan. CSW called to initiate insurance authorization request through [employer] at 10:15 AM. CSW then met with patient to provide bed offers and discuss facilities. Patient acknowledged either [facility] or [facility], but that he would like to discuss with a family member when they arrive to the hospital to confirm. CSW contacted Admissions at [facility] and [facility] and confirmed beds available for the patient today if he gets approval to admit.  CSW will continue to follow.  Laveda Abbe, [place] Clinical Social Worker [phone]

## 2017-10-13 NOTE — Care Management Important Message (Signed)
Important Message  Patient Details  Name: Samuel Vega MRN: [id number] Date of Birth: 12/02/1923   Medicare Important Message Given:  Yes    Dorena BodoIris Evagelia Knack 10/13/2017, 2:41 PM

## 2017-10-13 NOTE — Progress Notes (Signed)
Rehab admissions - Please see rehab consult done by Dr. Allena KatzPatel recommending SNF placement.  Patient lives alone and does not have sufficient support for a discharge home after a potential acute inpatient rehab stay.  Call me for questions.  #[phone]

## 2017-10-13 NOTE — Telephone Encounter (Signed)
-----   Message from Marcelino DusterAngela Nicole Duke, [place] sent at 10/13/2017  9:26 AM EST ----- Regarding: 30-day event monitor Can you please help me set this patient up for a 30-day event monitor, per Dr. Duke Salviaandolph?  Thanks Angie

## 2017-10-13 NOTE — Progress Notes (Signed)
CSW following for discharge plan. CSW still had not received update from [facility] [employer] regarding authorization request, so patient will not be able to admit to SNF today. CSW alerted MD; MD is aware that patient will transition to SNF tomorrow, pending authorization from insurance.  CSW will continue to follow.  Blenda NicelyElizabeth Rayce Brahmbhatt, [place] Clinical Social Worker [phone]

## 2017-10-13 NOTE — Discharge Summary (Signed)
Physician Discharge Summary  Samuel Vega:[id number] DOB: [birth year] DOA: 10/08/2017  PCP: Samuel Oar, PA-C  Admit date: 10/08/2017 Discharge date: 10/13/2017  Time spent: > 35 minutes  Recommendations for Outpatient Follow-up:  1. Once LFTs normalize place patient on statin. 2. Ensure patient follows up with cardiologist and neurologist after hospital discharge 3. Prior to consideration of possible R CEA patient will need preoperative cardiac risk stratification prior to proceeding   Discharge Diagnoses:  Principal Problem:   Acute CVA (cerebrovascular accident) [facility]) Active Problems:   Aortic stenosis   PTSD (post-traumatic stress disorder)   Elevated troponin   Rhabdomyolysis   Syncope   Carotid stenosis, right   Cerebrovascular accident (CVA) due to embolism of right anterior cerebral artery (HCC)   Dementia without behavioral disturbance   Leukocytosis   Acute blood loss anemia   Discharge Condition: stable  Diet recommendation: Heart healthy  Filed Weights   10/08/17 2026  Weight: 81.6 kg (180 lb)    History of present illness:  84 y.o. male with history of severe aortic stenosis, PTSD presently not on any medications was brought to the ER after patient was found on the floor, presenting with right parietal stroke and MI.  Pt found to have R ICA stenosis and vascular surgery subsequently consulted.  Hospital Course:   Principal Problem:   Cerebrovascular accident (CVA) due to embolism of right anterior cerebral artery (HCC)  - Severe RICA stenosis vs cardioembolic: Patient presented with left-sided deficits - Holding statin secondary to elevated LFTs plan is to start statin on discharge once LFTs normalize Patient was evaluated by neurology who reported the following prior to signing off:  84 year old male with history of hypertension, aortic stenosis, PTSD admitted for found down at home, left arm and leg weakness, confusion with possible loss of  consciousness.  CT showed right posterior ACA territory hypodensity.  MRI showed right posterior ACA subacute infarct.  MRA head negative.  LDL 122, A1c 5.2.  EEG mild diffuse slowing with focal slowing over the right posterior quadrant, consistent with stroke location.  2D echo pending.  CTA head and neck showed right ICA 70-80% stenosis.Patient stroke likely due to right ICA high-grade stenosis.  Await vascular surgery consultation regarding potential further prevention.  Continue aspirin 325.  Hold off statin at this time due to abnormal LFT.  Will follow. I had a long discussion at the bedside with the patient and his son as well as spoke to his grandson and daughter-in-law who are both physicians in [place] and gave update about his situation. He will likely need rehabilitation prior to elective revascularization of the right carotid artery. Pt may be STROKE AF trial candidate.discussed with family and they are considering it.  Mobilize out of bed. Therapy and rehabilitation consults. Transfer to rehabilitation in the next few days. Follow-up as an outpatient in stroke clinic in 6 weeks.   Cardiology also recommended 30 day event monitor at discharge cardiology team on board and aware.  Active Problems:   Aortic stenosis/  Elevated troponin - Cardiology consulted and reported the following:  # Critical aortic stenosis: Mean gradient 71 mmHg.  He had known severe AS and was lost to follow up.  Vascular requesting full cardiac work up prior to considering carotid endarterectomy.  He is high risk for TAVR at this time given his recent stroke.  Will contact the Valve Team regarding the appropriate timeline to consider TAVR.  He was doing well clinically until this stroke with no  chest pain, dizziness or shortness of breath.  He was living alone, driving, and performing all ADLs independently.  There is concern for dementia and possibly being taken advantage by a neighbor.  He seems to be asking  good and appropriate questions this AM.  Will need a better understanding of his ability to care for himself prior to determining TAVR candidacy.  # Elevated troponin: Peaked at 1.71.  Normal systolic function on echo.  Likely demand ischemia.  No plans for intervention at this time given his stroke on presentation and no stress given critical AS.     PTSD (post-traumatic stress disorder) -Stable, on day of discharge patient requested something for anxiety as such gave him really low-dose Valium   Rhabdomyolysis -resolving with fluids. Have discontinued.     Syncope - Most likely secondary to principal problems stroke/MI    Carotid stenosis, right - Patient found to have right ICA stenosis 70-80% on CTA - Consulted vascular surgeon and plan is to reevaluate in one month  Procedures:  Pt has stroke work up  Consultations:  Vascular surgery  Cardiology  Neurology  Discharge Exam: Vitals:   10/13/17 0445 10/13/17 1142  BP: (!) 148/75 122/77  Pulse: 68 94  Resp: 16 17  Temp: 98.1 F (36.7 C) 97.8 F (36.6 C)  SpO2: 96% 98%    General: Pt in nad, alert and awake Cardiovascular: rrr, no rubs Respiratory: no increased wob, no wheezes  Discharge Instructions   Discharge Instructions    Ambulatory referral to Neurology   Complete by:  As directed    An appointment is requested in approximately: 6 weeks Follow up with stroke clinic NP (Jessica Vanschaick or Darrol Angelarolyn Martin, if both not available, consider Manson AllanSethi, Penumali, or Ahern) [facility] in about 4 weeks. Thanks.   Ambulatory referral to Physical Medicine Rehab   Complete by:  As directed    Call MD for:  severe uncontrolled pain   Complete by:  As directed    Call MD for:  temperature >100.4   Complete by:  As directed    Diet - low sodium heart healthy   Complete by:  As directed    Discharge instructions   Complete by:  As directed    Please follow-up with the cardiologist next week as well as neurologist  within the next one or 2 weeks   Increase activity slowly   Complete by:  As directed      Allergies as of 10/13/2017      Reactions   Statins Other (See Comments)   MYALGIAS      Medication List    TAKE these medications   aspirin 325 MG tablet Take 1 tablet (325 mg total) by mouth daily. Start taking on:  10/14/2017      Allergies  Allergen Reactions  . Statins Other (See Comments)    MYALGIAS   Follow-up Information    Micki RileySethi, Pramod S, MD. Schedule an appointment as soon as possible for a visit in 6 week(s).   Specialties:  Neurology, Radiology Contact information: [address] [place] [address] [phone]            The results of significant diagnostics from this hospitalization (including imaging, microbiology, ancillary and laboratory) are listed below for reference.    Significant Diagnostic Studies: Ct Angio Head W Or Wo Contrast  Result Date: 10/09/2017 CLINICAL DATA:  Stroke workup.  Patient found on floor. EXAM: CT ANGIOGRAPHY HEAD AND NECK TECHNIQUE: Multidetector CT  imaging of the head and neck was performed using the standard protocol during bolus administration of intravenous contrast. Multiplanar CT image reconstructions and MIPs were obtained to evaluate the vascular anatomy. Carotid stenosis measurements (when applicable) are obtained utilizing NASCET criteria, using the distal internal carotid diameter as the denominator. CONTRAST:  50mL ISOVUE-370 IOPAMIDOL (ISOVUE-370) INJECTION 76% COMPARISON:  Brain MRI from earlier today. FINDINGS: CT HEAD FINDINGS Brain: Right parietal low-density as characterized by MRI earlier today. No evidence of progression. No hemorrhage or hydrocephalus. Brain volume is excellent for age. There is a small right frontal hygroma without significant mass effect. Vascular: See below Skull: Questionable scalp swelling along the left parietal bone posteriorly. Negative for fracture. Sinuses: No acute finding.   Anterior nasal septum perforation. Orbits: Bilateral cataract resection. Review of the MIP images confirms the above findings CTA NECK FINDINGS Aortic arch: Mild atheromatous plaque.  Three vessel branching. Right carotid system: Advanced calcified and noncalcified plaque at the common carotid bifurcation and ICA bulb with 70-80% stenosis. No dissection or beading. Left carotid system: Moderate calcified and noncalcified plaque at the common carotid bifurcation without ulceration or flow limiting stenosis. Vertebral arteries: Proximal subclavian mild atherosclerosis. Right dominant vertebral artery. There is mild narrowing at the left vertebral origin. No beading or dissection. Skeleton: Diffuse cervical disc degeneration with narrowing reversal of lordosis. There is upper cervical facet arthropathy with C2-3 anterolisthesis. Other neck:  No acute or aggressive finding. Upper chest: Negative Review of the MIP images confirms the above findings CTA HEAD FINDINGS Anterior circulation: Atherosclerotic plaque on the carotid siphons without flow limiting stenosis. The left ICA is smaller than the right in the setting of hypoplastic left A1 segment. No visible branch occlusion or flow limiting stenosis. Negative for aneurysm. On MIP images there is prominent vessels about the right parietal convexity. Posterior circulation: Vertebrobasilar arteries are smooth and diffusely patent. Moderate atheromatous narrowing of the distal left P2 segment. Venous sinuses: Patent Anatomic variants: None significant Delayed phase: Mild cortical enhancement at the level of right parietal abnormality which is nonspecific but would be supportive of a subacute infarct. Review of the MIP images confirms the above findings IMPRESSION: 1. 70-80% atheromatous narrowing at the right ICA bulb. 2. No other significant stenosis in the neck. 3. Intracranial atherosclerosis without proximal or correctable stenosis. There is moderate atheromatous  narrowing of the distal left P2 segment. 4. Presumed infarct in the right parietal lobe (see brain MRI report). Mild associated enhancement would correlate with subacute timing. Electronically Signed   By: Marnee Spring M.D.   On: 10/09/2017 10:59   Dg Chest 2 View  Result Date: 10/08/2017 CLINICAL DATA:  Fall EXAM: CHEST  2 VIEW COMPARISON:  None. FINDINGS: Mild cardiomegaly. Bibasilar atelectasis. No effusions. No edema. No acute bony abnormality. IMPRESSION: Mild cardiomegaly.  Bibasilar atelectasis. Electronically Signed   By: Charlett Nose M.D.   On: 10/08/2017 23:17   Dg Pelvis 1-2 Views  Result Date: 10/09/2017 CLINICAL DATA:  84 year old male with fall and left hip pain. EXAM: PELVIS - 1-2 VIEW COMPARISON:  None. FINDINGS: There is no acute fracture or dislocation. The bones are osteopenic. Mild arthritic changes. There is degenerative changes of the visualized lower lumbar spine. The soft tissues appear unremarkable. Vascular calcification noted. IMPRESSION: No acute fracture or dislocation. Electronically Signed   By: Elgie Collard M.D.   On: 10/09/2017 05:33   Dg Elbow 2 Views Left  Result Date: 10/09/2017 CLINICAL DATA:  84 year old male with fall and left elbow  pain. EXAM: LEFT ELBOW - 2 VIEW COMPARISON:  None. FINDINGS: There is no acute fracture or dislocation. Mild osteopenia. No arthritic changes. The soft tissues appear unremarkable. No joint effusion. IMPRESSION: Negative. Electronically Signed   By: Elgie Collard M.D.   On: 10/09/2017 05:34   Ct Head Wo Contrast  Result Date: 10/08/2017 CLINICAL DATA:  Found on floor.  Bruising to left side of face EXAM: CT HEAD WITHOUT CONTRAST CT CERVICAL SPINE WITHOUT CONTRAST TECHNIQUE: Multidetector CT imaging of the head and cervical spine was performed following the standard protocol without intravenous contrast. Multiplanar CT image reconstructions of the cervical spine were also generated. COMPARISON:  None. FINDINGS: CT HEAD  FINDINGS Brain: Area of low-density noted in the posteromedial right parietal lobe concerning for acute infarct. There is atrophy and chronic small vessel disease changes. No hemorrhage or hydrocephalus. Vascular: No hyperdense vessel or unexpected calcification. Skull: No acute calvarial abnormality. Sinuses/Orbits: No acute findings Other: None CT CERVICAL SPINE FINDINGS Alignment: No subluxation. Skull base and vertebrae: No fracture Soft tissues and spinal canal: No prevertebral fluid or swelling. No visible canal hematoma. Disc levels:  Diffuse degenerative disc and facet disease. Upper chest: No acute findings Other: No acute findings IMPRESSION: Subtle low-density in the posteromedial high right parietal lobe concerning for acute infarction. No hemorrhage. Diffuse degenerative disc and facet disease in the cervical spine. No acute bony abnormality. Electronically Signed   By: Charlett Nose M.D.   On: 10/08/2017 23:33   Ct Angio Neck W Or Wo Contrast  Result Date: 10/09/2017 CLINICAL DATA:  Stroke workup.  Patient found on floor. EXAM: CT ANGIOGRAPHY HEAD AND NECK TECHNIQUE: Multidetector CT imaging of the head and neck was performed using the standard protocol during bolus administration of intravenous contrast. Multiplanar CT image reconstructions and MIPs were obtained to evaluate the vascular anatomy. Carotid stenosis measurements (when applicable) are obtained utilizing NASCET criteria, using the distal internal carotid diameter as the denominator. CONTRAST:  50mL ISOVUE-370 IOPAMIDOL (ISOVUE-370) INJECTION 76% COMPARISON:  Brain MRI from earlier today. FINDINGS: CT HEAD FINDINGS Brain: Right parietal low-density as characterized by MRI earlier today. No evidence of progression. No hemorrhage or hydrocephalus. Brain volume is excellent for age. There is a small right frontal hygroma without significant mass effect. Vascular: See below Skull: Questionable scalp swelling along the left parietal bone  posteriorly. Negative for fracture. Sinuses: No acute finding.  Anterior nasal septum perforation. Orbits: Bilateral cataract resection. Review of the MIP images confirms the above findings CTA NECK FINDINGS Aortic arch: Mild atheromatous plaque.  Three vessel branching. Right carotid system: Advanced calcified and noncalcified plaque at the common carotid bifurcation and ICA bulb with 70-80% stenosis. No dissection or beading. Left carotid system: Moderate calcified and noncalcified plaque at the common carotid bifurcation without ulceration or flow limiting stenosis. Vertebral arteries: Proximal subclavian mild atherosclerosis. Right dominant vertebral artery. There is mild narrowing at the left vertebral origin. No beading or dissection. Skeleton: Diffuse cervical disc degeneration with narrowing reversal of lordosis. There is upper cervical facet arthropathy with C2-3 anterolisthesis. Other neck:  No acute or aggressive finding. Upper chest: Negative Review of the MIP images confirms the above findings CTA HEAD FINDINGS Anterior circulation: Atherosclerotic plaque on the carotid siphons without flow limiting stenosis. The left ICA is smaller than the right in the setting of hypoplastic left A1 segment. No visible branch occlusion or flow limiting stenosis. Negative for aneurysm. On MIP images there is prominent vessels about the right parietal convexity. Posterior circulation:  Vertebrobasilar arteries are smooth and diffusely patent. Moderate atheromatous narrowing of the distal left P2 segment. Venous sinuses: Patent Anatomic variants: None significant Delayed phase: Mild cortical enhancement at the level of right parietal abnormality which is nonspecific but would be supportive of a subacute infarct. Review of the MIP images confirms the above findings IMPRESSION: 1. 70-80% atheromatous narrowing at the right ICA bulb. 2. No other significant stenosis in the neck. 3. Intracranial atherosclerosis without  proximal or correctable stenosis. There is moderate atheromatous narrowing of the distal left P2 segment. 4. Presumed infarct in the right parietal lobe (see brain MRI report). Mild associated enhancement would correlate with subacute timing. Electronically Signed   By: Marnee Spring M.D.   On: 10/09/2017 10:59   Ct Cervical Spine Wo Contrast  Result Date: 10/08/2017 CLINICAL DATA:  Found on floor.  Bruising to left side of face EXAM: CT HEAD WITHOUT CONTRAST CT CERVICAL SPINE WITHOUT CONTRAST TECHNIQUE: Multidetector CT imaging of the head and cervical spine was performed following the standard protocol without intravenous contrast. Multiplanar CT image reconstructions of the cervical spine were also generated. COMPARISON:  None. FINDINGS: CT HEAD FINDINGS Brain: Area of low-density noted in the posteromedial right parietal lobe concerning for acute infarct. There is atrophy and chronic small vessel disease changes. No hemorrhage or hydrocephalus. Vascular: No hyperdense vessel or unexpected calcification. Skull: No acute calvarial abnormality. Sinuses/Orbits: No acute findings Other: None CT CERVICAL SPINE FINDINGS Alignment: No subluxation. Skull base and vertebrae: No fracture Soft tissues and spinal canal: No prevertebral fluid or swelling. No visible canal hematoma. Disc levels:  Diffuse degenerative disc and facet disease. Upper chest: No acute findings Other: No acute findings IMPRESSION: Subtle low-density in the posteromedial high right parietal lobe concerning for acute infarction. No hemorrhage. Diffuse degenerative disc and facet disease in the cervical spine. No acute bony abnormality. Electronically Signed   By: Charlett Nose M.D.   On: 10/08/2017 23:33   Mr Brain Wo Contrast  Result Date: 10/09/2017 CLINICAL DATA:  84 y/o  M; possible syncope. EXAM: MRI HEAD WITHOUT CONTRAST TECHNIQUE: Multiplanar, multiecho pulse sequences of the brain and surrounding structures were obtained without  intravenous contrast. COMPARISON:  10/08/2017 CT head. FINDINGS: Brain: 3.8 x 3.9 x 4.6 cm (volume = 36 cm^3) region of predominantly cortical diffusion hyperintensity which demonstrates both areas of reduced, intermediate, and increased diffusion on ADC (see series 3, image 38 and series 350, image 38). The region is associated with increased T2 FLAIR hyperintense signal abnormality and minimal gyral swelling. No focus of reduced diffusion to suggest acute or early subacute infarction. No abnormal susceptibility hypointensity to indicate intracranial hemorrhage. Severalnonspecific foci of T2 FLAIR hyperintense signal abnormality in subcortical and periventricular white matter are compatible withmildchronic microvascular ischemic changes for age. Mildbrain parenchymal volume loss. There is displacement of cortical veins away from the calvarium over the right cerebral cortex with CSF signal (series 10, image 15 and series 6, image 16) which may represent a small hygroma. No midline shift. Vascular: Normal flow voids. Skull and upper cervical spine: Normal marrow signal. Sinuses/Orbits: Negative. Other: None. IMPRESSION: 1. Right superior and medial parietal lobe 36 cc area of predominantly cortical diffusion signal abnormality demonstrating areas of both reduced and increased diffusion. Findings probably represents a subacute infarction, but cerebritis or seizure activity should also be considered. No hemorrhage or mass effect. 2. Mild chronic microvascular ischemic changes and mild parenchymal volume loss of the brain. 3. Displacement of cortical veins over the right cerebral  convexity from the calvarium, possibly small hygroma. No significant mass effect on the brain. Electronically Signed   By: Mitzi Hansen M.D.   On: 10/09/2017 04:03   Dg Hand Complete Left  Result Date: 10/08/2017 CLINICAL DATA:  Fall.  Right hand pain EXAM: LEFT HAND - COMPLETE 3+ VIEW COMPARISON:  None. FINDINGS: No acute bony  abnormality. Specifically, no fracture, subluxation, or dislocation. IMPRESSION: No acute bony abnormality. Electronically Signed   By: Charlett Nose M.D.   On: 10/08/2017 23:21   Mr Maxine Glenn Head Wo Contrast  Result Date: 10/09/2017 CLINICAL DATA:  84 y/o  M; evaluation for stroke. EXAM: MRA HEAD WITHOUT CONTRAST TECHNIQUE: Angiographic images of the Circle of Willis were obtained using MRA technique without intravenous contrast. COMPARISON:  10/09/2017 MRI head. FINDINGS: Internal carotid arteries: Patent. Mild lumen irregularity without significant stenosis compatible with atherosclerosis. Anterior cerebral arteries:  Patent. Middle cerebral arteries: Patent. Anterior communicating artery: Patent. Posterior communicating arteries: Patent. Left P2 short segment of moderate stenosis. Posterior cerebral arteries:  Patent. Basilar artery:  Patent. Vertebral arteries:  Patent. No aneurysm or large vessel occlusion identified. Right ACA ACA distributions demonstrate increased flow related signal, probably hyperemia. IMPRESSION: Patent anterior and posterior intracranial circulation. No large vessel occlusion or aneurysm. Increase flow related signal within right ACA and MCA distributions, probably hyperemia given right parietal abnormality. Electronically Signed   By: Mitzi Hansen M.D.   On: 10/09/2017 06:11   Dg Femur Min 2 Views Left  Result Date: 10/09/2017 CLINICAL DATA:  84 year old male with fall and left hip pain. EXAM: LEFT FEMUR 2 VIEWS COMPARISON:  Pelvic radiograph dated 10/09/2017 FINDINGS: There is no acute fracture or dislocation. Mild osteopenia. No significant arthritic changes. Vascular calcifications noted. The soft tissues appear unremarkable. IMPRESSION: Negative. Electronically Signed   By: Elgie Collard M.D.   On: 10/09/2017 05:36    Microbiology: No results found for this or any previous visit (from the past 240 hour(s)).   Labs: Basic Metabolic Panel: Recent Labs  Lab  10/08/17 2226 10/09/17 0456 10/13/17 1032  NA 144 143 141  K 4.1 4.0 3.9  CL 108 108 107  CO2 22 21* 26  GLUCOSE 117* 119* 97  BUN 25* 29* 15  CREATININE 1.01 1.02 0.90  CALCIUM 9.3 8.8* 8.5*   Liver Function Tests: Recent Labs  Lab 10/08/17 2226 10/09/17 0456 10/13/17 1032  AST 169* 157* 67*  ALT 45 43 38  ALKPHOS 77 69 64  BILITOT 1.1 0.9 0.5  PROT 7.2 6.2* 5.8*  ALBUMIN 3.5 3.2* 2.7*   No results for input(s): LIPASE, AMYLASE in the last 168 hours. No results for input(s): AMMONIA in the last 168 hours. CBC: Recent Labs  Lab 10/08/17 2226 10/09/17 0456  WBC 15.9* 13.6*  NEUTROABS 13.7* 10.9*  HGB 14.0 12.9*  HCT 42.9 39.4  MCV 83.8 83.5  PLT 180 177   Cardiac Enzymes: Recent Labs  Lab 10/08/17 2226 10/08/17 2315 10/09/17 0456 10/09/17 1032 10/09/17 1538 10/11/17 0304  CKTOTAL 3,971*  --  4,000*  --   --  1,207*  CKMB  --   --   --   --   --  8.9*  TROPONINI  --  1.71* 1.71* 1.34* 1.22*  --    BNP: BNP (last 3 results) No results for input(s): BNP in the last 8760 hours.  ProBNP (last 3 results) No results for input(s): PROBNP in the last 8760 hours.  CBG: Recent Labs  Lab 10/12/17 0444  GLUCAP  85    Signed:  Penny Pia MD.  [facility] 10/13/2017, 3:39 PM

## 2017-10-13 NOTE — Progress Notes (Signed)
Physical Therapy Treatment Patient Details Name: Samuel Vega MRN: [id number] DOB: [birth year] Today's Date: 10/13/2017    History of Present Illness Samuel Vega is a 84 y.o. male with history of severe aortic stenosis, PTSD presently not on any medications was brought to the ER after patient was found on the floor, presenting with right parietal stroke and MI.    PT Comments    Pt demo's improved L LE strength and ability to ambulate with minA and use of mirror for biofeedback to aide in self-correcting posture and achieving midline posture due to L lateral lean. Pt con't to be extremely motivated. Con't to be an excellent candidate for CIR but aware pt doesn't have assist at home.  Follow Up Recommendations  CIR     Equipment Recommendations  Rolling walker with 5" wheels    Recommendations for Other Services       Precautions / Restrictions Precautions Precautions: Fall Precaution Comments: bruising on L side of face Restrictions Weight Bearing Restrictions: No    Mobility  Bed Mobility               General bed mobility comments: pt up in chair upon PT arrival  Transfers Overall transfer level: Needs assistance Equipment used: Rolling walker (2 wheeled) Transfers: Sit to/from Stand Sit to Stand: Min assist         General transfer comment: max v/c's to push up from chair  Ambulation/Gait Ambulation/Gait assistance: Mod assist;+2 safety/equipment Ambulation Distance (Feet): 50 Feet(x2) Assistive device: Rolling walker (2 wheeled) Gait Pattern/deviations: Step-through pattern;Decreased stride length Gait velocity: slow   General Gait Details: pt with strong L lateral lean. used the mirror for biofeedback, pt able to self correct with biofeedback from mirror. Pt with improved ability to consistanlty clear L foot but remains to require v/c's to minimize L foot external rotation   Stairs            Wheelchair Mobility    Modified  Rankin (Stroke Patients Only) Modified Rankin (Stroke Patients Only) Pre-Morbid Rankin Score: No significant disability Modified Rankin: Moderately severe disability     Balance Overall balance assessment: Needs assistance Sitting-balance support: Feet supported;No upper extremity supported Sitting balance-Leahy Scale: Fair Sitting balance - Comments: initally leaning to R, able to progress to sitting EOB with supervision   Standing balance support: Bilateral upper extremity supported Standing balance-Leahy Scale: Poor Standing balance comment: dependent on physical assist and UE support                            Cognition Arousal/Alertness: Awake/alert Behavior During Therapy: WFL for tasks assessed/performed Overall Cognitive Status: Impaired/Different from baseline Area of Impairment: Following commands;Safety/judgement;Awareness;Problem solving                   Current Attention Level: Sustained   Following Commands: Follows one step commands with increased time Safety/Judgement: Decreased awareness of safety Awareness: Emergent Problem Solving: Slow processing;Decreased initiation;Difficulty sequencing General Comments: pt proud of L LE strength improvements and adherence/compliance with HEP      Exercises General Exercises - Lower Extremity Long Arc Quad: AROM;Left;10 reps;Seated Hip Flexion/Marching: AROM;10 reps;Seated;Left    General Comments        Pertinent Vitals/Pain Pain Assessment: Faces Faces Pain Scale: Hurts even more Pain Location: L hip into back with movement Pain Descriptors / Indicators: Grimacing Pain Intervention(s): Monitored during session    Home Living  Prior Function            PT Goals (current goals can now be found in the care plan section) Progress towards PT goals: Progressing toward goals    Frequency    Min 4X/week      PT Plan Discharge plan needs to be updated     Co-evaluation              AM-PAC PT "6 Clicks" Daily Activity  Outcome Measure  Difficulty turning over in bed (including adjusting bedclothes, sheets and blankets)?: Unable Difficulty moving from lying on back to sitting on the side of the bed? : Unable Difficulty sitting down on and standing up from a chair with arms (e.g., wheelchair, bedside commode, etc,.)?: Unable Help needed moving to and from a bed to chair (including a wheelchair)?: A Little Help needed walking in hospital room?: A Little Help needed climbing 3-5 steps with a railing? : A Lot 6 Click Score: 11    End of Session Equipment Utilized During Treatment: Gait belt Activity Tolerance: Patient tolerated treatment well Patient left: in chair;with call bell/phone within reach;with chair alarm set Nurse Communication: Mobility status PT Visit Diagnosis: Other abnormalities of gait and mobility (R26.89);Ataxic gait (R26.0);Other symptoms and signs involving the nervous system (R29.898);History of falling (Z91.81);Unsteadiness on feet (R26.81);Hemiplegia and hemiparesis Hemiplegia - Right/Left: Left Hemiplegia - dominant/non-dominant: Non-dominant Hemiplegia - caused by: Cerebral infarction     Time: 0821-0900 PT Time Calculation (min) (ACUTE ONLY): 39 min  Charges:  $Gait Training: 8-22 mins $Therapeutic Exercise: 8-22 mins $Neuromuscular Re-education: 8-22 mins                    G Codes:       Lewis ShockAshly Jeson Camacho, PT, DPT Pager #: [phone] Office #: [phone]    Otila Starn M Maely Clements 10/13/2017, 10:28 AM

## 2017-10-13 NOTE — Telephone Encounter (Signed)
Patient is scheduled for cardiac monitor at 1000 on Monday 3/11 after his appointment with Dr. Clifton JamesMcAlhany.

## 2017-10-13 NOTE — Telephone Encounter (Signed)
Received request from A. Duke, PA to schedule patient for a cardiac event monitor. I spoke with our TAVR Coordinator, [name], RN who advised that patient would likely need to have the monitor placed on Monday 3/11 due to the fact that [facility]. Monitor order is in epic, will work with scheduling to get an appointment for patient.

## 2017-10-13 NOTE — Progress Notes (Signed)
CSW met with patient and patient's daughter-in-law [name] at bedside, and confirmed that the family wanted [facility] for the patient to admit for SNF. CSW provided info to patient and patient's daughter-in-law that [facility] had a room ready for him to admit today if [employer] provides authorization for patient to admit.   CSW contacted [employer] and spoke to on-call nurse Tammy to provide information on patient choosing [facility] for SNF. Tammy provided update that the case has gone to the medical director for review and they will call CSW with decision after review. CSW provided update to [facility]. [facility] will still be able to accept the patient as long as information can be sent to facility by 4 pm today; will have to wait until tomorrow if auth comes through after 4 pm.   CSW will continue to follow.  Laveda Abbe, [place] Clinical Social Worker [phone]

## 2017-10-14 DIAGNOSIS — I4891 Unspecified atrial fibrillation: Secondary | ICD-10-CM | POA: Diagnosis not present

## 2017-10-14 DIAGNOSIS — I6521 Occlusion and stenosis of right carotid artery: Secondary | ICD-10-CM | POA: Diagnosis not present

## 2017-10-14 DIAGNOSIS — E785 Hyperlipidemia, unspecified: Secondary | ICD-10-CM | POA: Diagnosis not present

## 2017-10-14 DIAGNOSIS — G4089 Other seizures: Secondary | ICD-10-CM | POA: Diagnosis not present

## 2017-10-14 DIAGNOSIS — H3530 Unspecified macular degeneration: Secondary | ICD-10-CM | POA: Diagnosis not present

## 2017-10-14 DIAGNOSIS — G40109 Localization-related (focal) (partial) symptomatic epilepsy and epileptic syndromes with simple partial seizures, not intractable, without status epilepticus: Secondary | ICD-10-CM | POA: Diagnosis not present

## 2017-10-14 DIAGNOSIS — R251 Tremor, unspecified: Secondary | ICD-10-CM | POA: Diagnosis not present

## 2017-10-14 DIAGNOSIS — R55 Syncope and collapse: Secondary | ICD-10-CM | POA: Diagnosis not present

## 2017-10-14 DIAGNOSIS — M6281 Muscle weakness (generalized): Secondary | ICD-10-CM | POA: Diagnosis not present

## 2017-10-14 DIAGNOSIS — Z7982 Long term (current) use of aspirin: Secondary | ICD-10-CM | POA: Diagnosis not present

## 2017-10-14 DIAGNOSIS — R402441 Other coma, without documented Glasgow coma scale score, or with partial score reported, in the field [EMT or ambulance]: Secondary | ICD-10-CM | POA: Diagnosis not present

## 2017-10-14 DIAGNOSIS — Z7401 Bed confinement status: Secondary | ICD-10-CM | POA: Diagnosis not present

## 2017-10-14 DIAGNOSIS — I63421 Cerebral infarction due to embolism of right anterior cerebral artery: Secondary | ICD-10-CM | POA: Diagnosis not present

## 2017-10-14 DIAGNOSIS — I350 Nonrheumatic aortic (valve) stenosis: Secondary | ICD-10-CM | POA: Diagnosis not present

## 2017-10-14 DIAGNOSIS — R2689 Other abnormalities of gait and mobility: Secondary | ICD-10-CM | POA: Diagnosis not present

## 2017-10-14 DIAGNOSIS — Z8673 Personal history of transient ischemic attack (TIA), and cerebral infarction without residual deficits: Secondary | ICD-10-CM | POA: Diagnosis not present

## 2017-10-14 DIAGNOSIS — I69954 Hemiplegia and hemiparesis following unspecified cerebrovascular disease affecting left non-dominant side: Secondary | ICD-10-CM | POA: Diagnosis not present

## 2017-10-14 DIAGNOSIS — F4310 Post-traumatic stress disorder, unspecified: Secondary | ICD-10-CM | POA: Diagnosis not present

## 2017-10-14 DIAGNOSIS — I639 Cerebral infarction, unspecified: Secondary | ICD-10-CM | POA: Diagnosis not present

## 2017-10-14 DIAGNOSIS — G464 Cerebellar stroke syndrome: Secondary | ICD-10-CM | POA: Diagnosis not present

## 2017-10-14 DIAGNOSIS — R29810 Facial weakness: Secondary | ICD-10-CM | POA: Diagnosis not present

## 2017-10-14 DIAGNOSIS — R278 Other lack of coordination: Secondary | ICD-10-CM | POA: Diagnosis not present

## 2017-10-14 DIAGNOSIS — R41841 Cognitive communication deficit: Secondary | ICD-10-CM | POA: Diagnosis not present

## 2017-10-14 DIAGNOSIS — R569 Unspecified convulsions: Secondary | ICD-10-CM | POA: Diagnosis not present

## 2017-10-14 DIAGNOSIS — R2681 Unsteadiness on feet: Secondary | ICD-10-CM | POA: Diagnosis not present

## 2017-10-14 DIAGNOSIS — Z8249 Family history of ischemic heart disease and other diseases of the circulatory system: Secondary | ICD-10-CM | POA: Diagnosis not present

## 2017-10-14 DIAGNOSIS — J80 Acute respiratory distress syndrome: Secondary | ICD-10-CM | POA: Diagnosis not present

## 2017-10-14 DIAGNOSIS — G4700 Insomnia, unspecified: Secondary | ICD-10-CM | POA: Diagnosis not present

## 2017-10-14 NOTE — Care Management Note (Signed)
Case Management Note  Patient Details  Name: Samuel Vega MRN: [id number] Date of Birth: 04/02/1924  Subjective/Objective:                    Action/Plan: Pt discharging to SNF. CM signing off.  Expected Discharge Date:  10/13/17               Expected Discharge Plan:  IP Rehab Facility  In-House Referral:  Clinical Social Work  Discharge planning Services  CM Consult  Post Acute Care Choice:    Choice offered to:     DME Arranged:    DME Agency:     HH Arranged:    HH Agency:     Status of Service:  Completed, signed off  If discussed at [employer] Meetings, dates discussed:    Additional Comments:  Kermit BaloKelli F Amily Depp, RN 10/14/2017, 3:29 PM

## 2017-10-14 NOTE — Progress Notes (Signed)
Physical Therapy Treatment Patient Details Name: Samuel Vega MRN: [id number] DOB: 01/07/1924 Today's Date: 10/14/2017    History of Present Illness Samuel Vega is a 84 y.o. male with history of severe aortic stenosis, PTSD presently not on any medications was brought to the ER after patient was found on the floor, presenting with right parietal stroke and MI.    PT Comments    Patient received in his chair, having slid himself down to the foot of the chair so that almost half of his body was resting on the drop-able footrest/imminently about to slide onto floor, chair alarm worked out from under patient and not registering hazardous situation. Patient very pleasant and interactive with PT but with very poor safety awareness/understanding of possible consequences of this position and initially stating "I don't need to move, I'm very comfortable". He requires ModA x2 to reposition to a safe position in the chair today, then was able to perform functional transfers and gait approximately 74f with min guard and cues for safety, sequencing, and gait form corrections.  Able to perform toileting and clean himself in the bathroom with Min guard for safety today. Overall his mobility and balance is greatly increasing but he continues to be a motivated and appropriate candidate for ongoing skilled PT interventions. He was left in bed with all needs met, bed alarm activated this afternoon.    Follow Up Recommendations  CIR     Equipment Recommendations  Rolling walker with 5" wheels    Recommendations for Other Services       Precautions / Restrictions Precautions Precautions: Fall Precaution Comments: bruising on L side of face Restrictions Weight Bearing Restrictions: No    Mobility  Bed Mobility Overal bed mobility: Needs Assistance Bed Mobility: Sit to Supine       Sit to supine: Min assist   General bed mobility comments: MinA for LE management for back to bed    Transfers Overall transfer level: Needs assistance Equipment used: Rolling walker (2 wheeled) Transfers: Sit to/from Stand Sit to Stand: Min guard         General transfer comment: Mod VCs for safety, otherwise min guard for functional transfers today   Ambulation/Gait Ambulation/Gait assistance: Min guard Ambulation Distance (Feet): 90 Feet Assistive device: Rolling walker (2 wheeled) Gait Pattern/deviations: Step-through pattern;Decreased stride length;Steppage Gait velocity: slow   General Gait Details: balance and upright posture during gait greatly improved today, patient able to self-correct and monitor without visual feedback today; continues to have significant L foot ER, performed mild steppage pattern with L LE today as well, both of which required VC to assist in correcting    Stairs            Wheelchair Mobility    Modified Rankin (Stroke Patients Only)       Balance Overall balance assessment: Needs assistance Sitting-balance support: Feet supported;No upper extremity supported Sitting balance-Leahy Scale: Good Sitting balance - Comments: able to maintain midline sitting with S-Min guard    Standing balance support: Bilateral upper extremity supported;During functional activity Standing balance-Leahy Scale: Fair Standing balance comment: reliant on UE support                             Cognition Arousal/Alertness: Awake/alert Behavior During Therapy: WFL for tasks assessed/performed Overall Cognitive Status: Impaired/Different from baseline Area of Impairment: Following commands;Safety/judgement;Awareness;Problem solving  Current Attention Level: Sustained   Following Commands: Follows one step commands with increased time Safety/Judgement: Decreased awareness of safety Awareness: Emergent Problem Solving: Slow processing;Decreased initiation;Difficulty sequencing General Comments: very poor safety  awareness      Exercises      General Comments        Pertinent Vitals/Pain Pain Assessment: No/denies pain Pain Score: 0-No pain Faces Pain Scale: No hurt Pain Intervention(s): Limited activity within patient's tolerance;Monitored during session    Home Living                      Prior Function            PT Goals (current goals can now be found in the care plan section) Acute Rehab PT Goals Patient Stated Goal: go home PT Goal Formulation: With patient/family Time For Goal Achievement: 10/23/17 Potential to Achieve Goals: Fair Progress towards PT goals: Progressing toward goals    Frequency    Min 4X/week      PT Plan Current plan remains appropriate    Co-evaluation              AM-PAC PT "6 Clicks" Daily Activity  Outcome Measure  Difficulty turning over in bed (including adjusting bedclothes, sheets and blankets)?: Unable Difficulty moving from lying on back to sitting on the side of the bed? : Unable Difficulty sitting down on and standing up from a chair with arms (e.g., wheelchair, bedside commode, etc,.)?: Unable Help needed moving to and from a bed to chair (including a wheelchair)?: A Little Help needed walking in hospital room?: A Little Help needed climbing 3-5 steps with a railing? : A Little 6 Click Score: 12    End of Session Equipment Utilized During Treatment: Gait belt Activity Tolerance: Patient tolerated treatment well Patient left: in bed;with call bell/phone within reach;with bed alarm set Nurse Communication: Mobility status PT Visit Diagnosis: Other abnormalities of gait and mobility (R26.89);Ataxic gait (R26.0);Other symptoms and signs involving the nervous system (R29.898);History of falling (Z91.81);Unsteadiness on feet (R26.81);Hemiplegia and hemiparesis Hemiplegia - Right/Left: Left Hemiplegia - dominant/non-dominant: Non-dominant Hemiplegia - caused by: Cerebral infarction     Time: 1415-1447 PT Time  Calculation (min) (ACUTE ONLY): 32 min  Charges:  $Gait Training: 8-22 mins $Therapeutic Activity: 8-22 mins                    G Codes:       Deniece Ree PT, DPT, CBIS  Supplemental Physical Therapist [place]   Pager [phone]

## 2017-10-14 NOTE — Progress Notes (Signed)
Called report to receiving nurse at skilled nursing facility. Lawson RadarHeather M Neyland Pettengill

## 2017-10-14 NOTE — Clinical Social Work Placement (Signed)
Nurse to call report to [phone], Room 508  Transport to be set up for 2:30 PM.    CLINICAL SOCIAL WORK PLACEMENT  NOTE  Date:  10/14/2017  Patient Details  Name: Concha NorwayWilliam T Lavalais MRN: [id number] Date of Birth: 08/27/1923  Clinical Social Work is seeking post-discharge placement for this patient at the Skilled  Nursing Facility level of care (*CSW will initial, date and re-position this form in  chart as items are completed):  Yes   Patient/family provided with [facility] Clinical Social Work Department's list of facilities offering this level of care within the geographic area requested by the patient (or if unable, by the patient's family).  Yes   Patient/family informed of their freedom to choose among providers that offer the needed level of care, that participate in Medicare, Medicaid or managed care program needed by the patient, have an available bed and are willing to accept the patient.  Yes   Patient/family informed of [facility]'s ownership interest in [facility] and [facility], as well as of the fact that they are under no obligation to receive care at these facilities.  PASRR submitted to EDS on       PASRR number received on 10/11/17     Existing PASRR number confirmed on       FL2 transmitted to all facilities in geographic area requested by pt/family on 10/12/17     FL2 transmitted to all facilities within larger geographic area on       Patient informed that his/her managed care company has contracts with or will negotiate with certain facilities, including the following:        Yes   Patient/family informed of bed offers received.  [facility]     Physician recommends and patient chooses bed at      [facility] 10[facility].  Patient to be transferred to facility by PTAR     Patient family notified on 10/14/17 of transfer.  Name of family member notified:        PHYSICIAN        Additional Comment:    _______________________________________________ Baldemar LenisElizabeth M Andyn Sales, LCSW 10/14/2017, 10:34 AM

## 2017-10-14 NOTE — Progress Notes (Signed)
Patient discharged to skilled nursing facility. IV and telemetry discontinued. Discharge information and prescriptions given to PTAR to transport with patient to facility. Leaving unit via stretcher. Lawson RadarHeather M Damari Hiltz

## 2017-10-15 DIAGNOSIS — R2681 Unsteadiness on feet: Secondary | ICD-10-CM | POA: Diagnosis not present

## 2017-10-19 ENCOUNTER — Encounter: Payer: Self-pay | Admitting: Cardiovascular Disease

## 2017-10-19 ENCOUNTER — Ambulatory Visit (INDEPENDENT_AMBULATORY_CARE_PROVIDER_SITE_OTHER): Payer: PPO | Admitting: Cardiovascular Disease

## 2017-10-19 ENCOUNTER — Other Ambulatory Visit: Payer: Self-pay | Admitting: Cardiovascular Disease

## 2017-10-19 ENCOUNTER — Ambulatory Visit (INDEPENDENT_AMBULATORY_CARE_PROVIDER_SITE_OTHER): Payer: PPO

## 2017-10-19 VITALS — BP 132/62 | HR 70 | Ht 68.0 in

## 2017-10-19 DIAGNOSIS — I639 Cerebral infarction, unspecified: Secondary | ICD-10-CM

## 2017-10-19 DIAGNOSIS — I350 Nonrheumatic aortic (valve) stenosis: Secondary | ICD-10-CM | POA: Diagnosis not present

## 2017-10-19 DIAGNOSIS — I4891 Unspecified atrial fibrillation: Secondary | ICD-10-CM

## 2017-10-19 DIAGNOSIS — R55 Syncope and collapse: Secondary | ICD-10-CM | POA: Diagnosis not present

## 2017-10-19 DIAGNOSIS — G4700 Insomnia, unspecified: Secondary | ICD-10-CM | POA: Diagnosis not present

## 2017-10-19 DIAGNOSIS — R2681 Unsteadiness on feet: Secondary | ICD-10-CM | POA: Diagnosis not present

## 2017-10-19 NOTE — H&P (View-Only) (Signed)
Chief Complaint  Patient presents with  . New Patient (Initial Visit)    Severe aortic stenosis   History of Present Illness: 84 yo male with history of aortic stenosis, HLD, HTN and recent CVA who is here today as a new consult for evaluation of severe aortic stenosis, referred by Dr. Katrinka Blazing. He was noted to have severe aortic stenosis in September 2015 but had no symptoms at that time. He has had no repeat imaging since then. He was admitted to [facility] 10/09/17 with an acute stroke. He was found to have high grade stenosis of his right internal carotid artery. Echo 10/10/17 with normal LV systolic function. His aortic valve is heavily thickened and calcified with severe stenosis. Mean gradient 71 mmHg, DVI 0.13. He was seen in the hospital by vascular surgery and will need a right carotid endarterectomy.   He tells me today that he is recovering from his stroke. He can now use his left arm. He is getting more use from his left leg. He is in [facility] in rehab. He had lived alone before his stroke. His son is here with him today. He has occasional chest pain. He also notes ongoing fatigue but no dyspnea or lower ext edema. He has some dizziness.   His son Brayln Duque is his contact [phone] or Ronnie's wife is [phone]  Primary Care Physician: Porfirio Oar, PA-C Primary Cardiologist: Verdis Prime, MD Referring Cardiologist: Verdis Prime, MD  Past Medical History:  Diagnosis Date  . Anxiety   . Cardiac murmur   . Chronic back pain   . CVA (cerebral vascular accident) (HCC)   . ED (erectile dysfunction)   . Hyperlipidemia   . Macular degeneration   . PTSD (post-traumatic stress disorder)   . 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 ELBOW SURGERY    . EYE SURGERY      Current Outpatient Medications  Medication Sig Dispense Refill  . aspirin 325 MG tablet Take 1 tablet (325 mg  total) by mouth daily. 30 tablet 0   No current facility-administered medications for this visit.     Allergies  Allergen Reactions  . Statins Other (See Comments)    MYALGIAS    Social History   Socioeconomic History  . Marital status: Divorced    Spouse name: n/a  . Number of children: 2  . Years of education: Not on file  . Highest education level: Not on file  Social Needs  . Financial resource strain: Not on file  . Food insecurity - worry: Not on file  . Food insecurity - inability: Not on file  . Transportation needs - medical: Not on file  . Transportation needs - non-medical: Not on file  Occupational History  . Occupation: retired  Tobacco Use  . Smoking status: Never Smoker  . Smokeless tobacco: Never Used  Substance and Sexual Activity  . Alcohol use: No    Comment: none since 1980  . Drug use: No  . Sexual activity: Yes    Partners: Female  Other Topics Concern  . Not on file  Social History Narrative   Divorced in [place]. 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  His grandson is a Optometrist.    Family History  Problem Relation Age of Onset  . Hypertension Other     Review of Systems:  As stated in the HPI and otherwise negative.  BP 132/62   Pulse 70   Ht 5\' 8"  (1.727 m)   SpO2 99%   BMI 27.37 kg/m   Physical Examination: General: Well developed, well nourished, NAD  HEENT: OP clear, mucus membranes moist  SKIN: warm, dry. No rashes. Neuro: No focal deficits  Musculoskeletal: Muscle strength 5/5 all ext except left leg which is 3/5 Psychiatric: Mood and affect normal  Neck: No JVD, no carotid bruits, no thyromegaly, no lymphadenopathy.  Lungs:Clear bilaterally, no wheezes, rhonci, crackles Cardiovascular: Regular rate and rhythm. Loud, harsh, late peaking systolic murmur.  Abdomen:Soft. Bowel sounds present. Non-tender.  Extremities: No lower extremity edema. Pulses are 2 + in the bilateral DP/PT.  Echo 10/10/17: - Left ventricle: The cavity size was normal.  Wall thickness was   increased in a pattern of moderate LVH. Systolic function was   normal. The estimated ejection fraction was in the range of 60%   to 65%. Wall motion was normal; there were no regional wall   motion abnormalities. Doppler parameters are consistent with   abnormal left ventricular relaxation (grade 1 diastolic   dysfunction). - Aortic valve: Valve mobility was moderately restricted.   Transvalvular velocity was increased. There was critical   stenosis. Valve area (VTI): 0.41 cm^2. Valve area (Vmax): 0.43   cm^2. Valve area (Vmean): 0.4 cm^2. - Mitral valve: Mildly calcified annulus.  -------------------------------------------------------------------------------------- Left ventricle:  The cavity size was normal. Wall thickness was increased in a pattern of moderate LVH. Systolic function was normal. The estimated ejection fraction was in the range of 60% to 65%. Wall motion was normal; there were no regional wall motion abnormalities. Doppler parameters are consistent with abnormal left ventricular relaxation (grade 1 diastolic dysfunction).  ------------------------------------------------------------------- Aortic valve:   Trileaflet; moderately thickened, severely calcified leaflets. Valve mobility was moderately restricted. Doppler:  Transvalvular velocity was increased. There was critical stenosis. There was no regurgitation.    VTI ratio of LVOT to aortic valve: 0.13. Valve area (VTI): 0.41 cm^2. Indexed valve area (VTI): 0.21 cm^2/m^2. Peak velocity ratio of LVOT to aortic valve: 0.14. Valve area (Vmax): 0.43 cm^2. Indexed valve area (Vmax): 0.22 cm^2/m^2. Mean velocity ratio of LVOT to aortic valve: 0.13. Valve area (Vmean): 0.4 cm^2. Indexed valve area (Vmean): 0.2 cm^2/m^2.  Mean gradient (S): 71 mm Hg. Peak gradient (S): 111 mm Hg.  ------------------------------------------------------------------- Aorta:  Aortic root: The aortic root was normal  in size.  ------------------------------------------------------------------- Mitral valve:   Mildly calcified annulus. Mobility was not restricted.  Doppler:  Transvalvular velocity was within the normal range. There was no evidence for stenosis. There was no regurgitation.    Valve area by pressure half-time: 3.24 cm^2. Indexed valve area by pressure half-time: 1.62 cm^2/m^2.    Peak gradient (D): 2 mm Hg.  ------------------------------------------------------------------- Left atrium:  The atrium was normal in size.  ------------------------------------------------------------------- Right ventricle:  The cavity size was normal. Wall thickness was normal. Systolic function was normal.  ------------------------------------------------------------------- Pulmonic valve:    The valve appears to be grossly normal. Doppler:  Transvalvular velocity was within the normal range. There was no evidence for stenosis.  ------------------------------------------------------------------- Tricuspid valve:   Structurally normal valve.    Doppler: Transvalvular velocity was within the normal range. There was no regurgitation.  ------------------------------------------------------------------- Right atrium:  The atrium was normal in size.  ------------------------------------------------------------------- Pericardium:  There was no pericardial effusion.  ------------------------------------------------------------------- Systemic veins: Inferior vena cava: The vessel was normal in size.  ------------------------------------------------------------------- Measurements   Left ventricle  Value          Reference  LV ID, ED, PLAX chordal          (L)     32    mm       43 - 52  LV ID, ES, PLAX chordal                  23    mm       23 - 38  LV fx shortening, PLAX chordal   (L)     28    %        >=29  LV PW thickness, ED                      14    mm        ----------  IVS/LV PW ratio, ED                      1              <=1.3  Stroke volume, 2D                        53    ml       ----------  Stroke volume/bsa, 2D                    27    ml/m^2   ----------  LV e&', lateral                           7.18  cm/s     ----------  LV E/e&', lateral                         10.54          ----------  LV e&', medial                            6.31  cm/s     ----------  LV E/e&', medial                          12             ----------  LV e&', average                           6.75  cm/s     ----------  LV E/e&', average                         11.22          ----------    Ventricular septum                       Value          Reference  IVS thickness, ED                        14    mm       ----------    LVOT  Value          Reference  LVOT ID, S                               20    mm       ----------  LVOT area                                3.14  cm^2     ----------  LVOT peak velocity, S                    71.7  cm/s     ----------  LVOT mean velocity, S                    50.5  cm/s     ----------  LVOT VTI, S                              16.8  cm       ----------    Aortic valve                             Value          Reference  Aortic valve peak velocity, S            527   cm/s     ----------  Aortic valve mean velocity, S            400   cm/s     ----------  Aortic valve VTI, S                      130   cm       ----------  Aortic mean gradient, S                  71    mm Hg    ----------  Aortic peak gradient, S                  111   mm Hg    ----------  VTI ratio, LVOT/AV                       0.13           ----------  Aortic valve area, VTI                   0.41  cm^2     ----------  Aortic valve area/bsa, VTI               0.21  cm^2/m^2 ----------  Velocity ratio, peak, LVOT/AV            0.14           ----------  Aortic valve area, peak velocity         0.43  cm^2      ----------  Aortic valve area/bsa, peak              0.22  cm^2/m^2 ----------  velocity  Velocity ratio, mean, LVOT/AV            0.13           ----------  Aortic valve  area, mean velocity         0.4   cm^2     ----------  Aortic valve area/bsa, mean              0.2   cm^2/m^2 ----------  velocity    Aorta                                    Value          Reference  Aortic root ID, ED                       27    mm       ----------    Left atrium                              Value          Reference  LA ID, A-P, ES                           41    mm       ----------  LA ID/bsa, A-P                           2.05  cm/m^2   <=2.2  LA volume, S                             52.7  ml       ----------  LA volume/bsa, S                         26.4  ml/m^2   ----------  LA volume, ES, 1-p A4C                   44.3  ml       ----------  LA volume/bsa, ES, 1-p A4C               22.2  ml/m^2   ----------  LA volume, ES, 1-p A2C                   57.9  ml       ----------  LA volume/bsa, ES, 1-p A2C               29    ml/m^2   ----------    Mitral valve                             Value          Reference  Mitral E-wave peak velocity              75.7  cm/s     ----------  Mitral A-wave peak velocity              104   cm/s     ----------  Mitral deceleration time         (H)     232   ms       150 - 230  Mitral pressure half-time  68    ms       ----------  Mitral peak gradient, D                  2     mm Hg    ----------  Mitral E/A ratio, peak                   0.7            ----------  Mitral valve area, PHT, DP               3.24  cm^2     ----------  Mitral valve area/bsa, PHT, DP           1.62  cm^2/m^2 ----------    Right atrium                             Value          Reference  RA ID, S-I, ES, A4C                      48.7  mm       34 - 49  RA area, ES, A4C                         16    cm^2     8.3 - 19.5  RA volume, ES, A/L                       45.3  ml        ----------  RA volume/bsa, ES, A/L                   22.7  ml/m^2   ----------    Systemic veins                           Value          Reference  Estimated CVP                            3     mm Hg    ----------    Right ventricle                          Value          Reference  TAPSE                                    27    mm       ----------  RV s&', lateral, S                        18    cm/s     ----------  Legend: (L)  and  (H)  mark values outside specified reference range.  EKG:  EKG is not ordered today. The ekg ordered today demonstrates   Recent Labs: 10/09/2017: Hemoglobin 12.9; Platelets 177 10/13/2017: ALT 38; BUN 15; Creatinine, Ser 0.90; Potassium 3.9; Sodium 141     Wt Readings from Last 3 Encounters:  10/08/17 180 lb (81.6 kg)  08/18/17 183 lb 6.4  oz (83.2 kg)  04/01/17 187 lb 3.2 oz (84.9 kg)     Other studies Reviewed: Additional studies/ records that were reviewed today include: . Review of the above records demonstrates:    Assessment and Plan:   1. Severe aortic valve stenosis: He has severe, stage D aortic valve stenosis. I have personally reviewed the echo images. The aortic valve is thickened, calcified with limited leaflet mobility. I think he would benefit from AVR. Given advanced age, he is not a good candidate for conventional AVR by surgical approach. I think he may be a good candidate for TAVR. This will be complicated by his recent stroke and high grade carotid disease.  STS Risk Score:  Risk of Mortality:  5.023% Renal Failure:  5.332% Permanent Stroke:  5.311% Prolonged Ventilation:  11.688% DSW Infection:  0.109% Reoperation:  4.394% Morbidity or Mortality:  19.398% Short Length of Stay:  14.211% Long Length of Stay:  14.509%  I have reviewed the natural history of aortic stenosis with the patient and their family members  who are present today. We have discussed the limitations of medical therapy and the poor  prognosis associated with symptomatic aortic stenosis. We have reviewed potential treatment options, including palliative medical therapy, conventional surgical aortic valve replacement, and transcatheter aortic valve replacement. We discussed treatment options in the context of the patient's specific comorbid medical conditions.    He would like to proceed with planning for TAVR. I will arrange a right and left heart catheterization at [facility] 11/04/17. This will allow one month to recover from his CVA. Risks and benefits of the cath procedure and the TAVR procedure are reviewed with the patient. After the cath, he will have a cardiac CT, CTA of the chest/abdomen and pelvis, PFTs and will then be referred to see one of the CT surgeons on our TAVR team. We will begin his workup for TAVR now. He will need one month to recover from his CVA.   He will pick up his 30 day event monitor today. This was arranged by the discharging team.    Current medicines are reviewed at length with the patient today.  The patient does not have concerns regarding medicines.  The following changes have been made:  no change  Labs/ tests ordered today include:   Orders Placed This Encounter  Procedures  . Basic metabolic panel  . CBC  . INR/PT     Disposition:   FU with the TAVR team.    Signed, Verne Carrow, MD 10/19/2017 11:52 AM    [facility] [address] [place], [place], [place]  [address] Phone: [phone]; Fax: [phone]

## 2017-10-19 NOTE — Progress Notes (Signed)
Chief Complaint  Patient presents with  . New Patient (Initial Visit)    Severe aortic stenosis   History of Present Illness: 84 yo male with history of aortic stenosis, HLD, HTN and recent CVA who is here today as a new consult for evaluation of severe aortic stenosis, referred by Dr. Katrinka Blazing. He was noted to have severe aortic stenosis in September 2015 but had no symptoms at that time. He has had no repeat imaging since then. He was admitted to [facility] 10/09/17 with an acute stroke. He was found to have high grade stenosis of his right internal carotid artery. Echo 10/10/17 with normal LV systolic function. His aortic valve is heavily thickened and calcified with severe stenosis. Mean gradient 71 mmHg, DVI 0.13. He was seen in the hospital by vascular surgery and will need a right carotid endarterectomy.   He tells me today that he is recovering from his stroke. He can now use his left arm. He is getting more use from his left leg. He is in [facility] in rehab. He had lived alone before his stroke. His son is here with him today. He has occasional chest pain. He also notes ongoing fatigue but no dyspnea or lower ext edema. He has some dizziness.   His son Brayln Duque is his contact [phone] or Ronnie's wife is [phone]  Primary Care Physician: Porfirio Oar, PA-C Primary Cardiologist: Verdis Prime, MD Referring Cardiologist: Verdis Prime, MD  Past Medical History:  Diagnosis Date  . Anxiety   . Cardiac murmur   . Chronic back pain   . CVA (cerebral vascular accident) (HCC)   . ED (erectile dysfunction)   . Hyperlipidemia   . Macular degeneration   . PTSD (post-traumatic stress disorder)   . 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 ELBOW SURGERY    . EYE SURGERY      Current Outpatient Medications  Medication Sig Dispense Refill  . aspirin 325 MG tablet Take 1 tablet (325 mg  total) by mouth daily. 30 tablet 0   No current facility-administered medications for this visit.     Allergies  Allergen Reactions  . Statins Other (See Comments)    MYALGIAS    Social History   Socioeconomic History  . Marital status: Divorced    Spouse name: n/a  . Number of children: 2  . Years of education: Not on file  . Highest education level: Not on file  Social Needs  . Financial resource strain: Not on file  . Food insecurity - worry: Not on file  . Food insecurity - inability: Not on file  . Transportation needs - medical: Not on file  . Transportation needs - non-medical: Not on file  Occupational History  . Occupation: retired  Tobacco Use  . Smoking status: Never Smoker  . Smokeless tobacco: Never Used  Substance and Sexual Activity  . Alcohol use: No    Comment: none since 1980  . Drug use: No  . Sexual activity: Yes    Partners: Female  Other Topics Concern  . Not on file  Social History Narrative   Divorced in [place]. 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  His grandson is a Optometrist.    Family History  Problem Relation Age of Onset  . Hypertension Other     Review of Systems:  As stated in the HPI and otherwise negative.  BP 132/62   Pulse 70   Ht 5\' 8"  (1.727 m)   SpO2 99%   BMI 27.37 kg/m   Physical Examination: General: Well developed, well nourished, NAD  HEENT: OP clear, mucus membranes moist  SKIN: warm, dry. No rashes. Neuro: No focal deficits  Musculoskeletal: Muscle strength 5/5 all ext except left leg which is 3/5 Psychiatric: Mood and affect normal  Neck: No JVD, no carotid bruits, no thyromegaly, no lymphadenopathy.  Lungs:Clear bilaterally, no wheezes, rhonci, crackles Cardiovascular: Regular rate and rhythm. Loud, harsh, late peaking systolic murmur.  Abdomen:Soft. Bowel sounds present. Non-tender.  Extremities: No lower extremity edema. Pulses are 2 + in the bilateral DP/PT.  Echo 10/10/17: - Left ventricle: The cavity size was normal.  Wall thickness was   increased in a pattern of moderate LVH. Systolic function was   normal. The estimated ejection fraction was in the range of 60%   to 65%. Wall motion was normal; there were no regional wall   motion abnormalities. Doppler parameters are consistent with   abnormal left ventricular relaxation (grade 1 diastolic   dysfunction). - Aortic valve: Valve mobility was moderately restricted.   Transvalvular velocity was increased. There was critical   stenosis. Valve area (VTI): 0.41 cm^2. Valve area (Vmax): 0.43   cm^2. Valve area (Vmean): 0.4 cm^2. - Mitral valve: Mildly calcified annulus.  -------------------------------------------------------------------------------------- Left ventricle:  The cavity size was normal. Wall thickness was increased in a pattern of moderate LVH. Systolic function was normal. The estimated ejection fraction was in the range of 60% to 65%. Wall motion was normal; there were no regional wall motion abnormalities. Doppler parameters are consistent with abnormal left ventricular relaxation (grade 1 diastolic dysfunction).  ------------------------------------------------------------------- Aortic valve:   Trileaflet; moderately thickened, severely calcified leaflets. Valve mobility was moderately restricted. Doppler:  Transvalvular velocity was increased. There was critical stenosis. There was no regurgitation.    VTI ratio of LVOT to aortic valve: 0.13. Valve area (VTI): 0.41 cm^2. Indexed valve area (VTI): 0.21 cm^2/m^2. Peak velocity ratio of LVOT to aortic valve: 0.14. Valve area (Vmax): 0.43 cm^2. Indexed valve area (Vmax): 0.22 cm^2/m^2. Mean velocity ratio of LVOT to aortic valve: 0.13. Valve area (Vmean): 0.4 cm^2. Indexed valve area (Vmean): 0.2 cm^2/m^2.  Mean gradient (S): 71 mm Hg. Peak gradient (S): 111 mm Hg.  ------------------------------------------------------------------- Aorta:  Aortic root: The aortic root was normal  in size.  ------------------------------------------------------------------- Mitral valve:   Mildly calcified annulus. Mobility was not restricted.  Doppler:  Transvalvular velocity was within the normal range. There was no evidence for stenosis. There was no regurgitation.    Valve area by pressure half-time: 3.24 cm^2. Indexed valve area by pressure half-time: 1.62 cm^2/m^2.    Peak gradient (D): 2 mm Hg.  ------------------------------------------------------------------- Left atrium:  The atrium was normal in size.  ------------------------------------------------------------------- Right ventricle:  The cavity size was normal. Wall thickness was normal. Systolic function was normal.  ------------------------------------------------------------------- Pulmonic valve:    The valve appears to be grossly normal. Doppler:  Transvalvular velocity was within the normal range. There was no evidence for stenosis.  ------------------------------------------------------------------- Tricuspid valve:   Structurally normal valve.    Doppler: Transvalvular velocity was within the normal range. There was no regurgitation.  ------------------------------------------------------------------- Right atrium:  The atrium was normal in size.  ------------------------------------------------------------------- Pericardium:  There was no pericardial effusion.  ------------------------------------------------------------------- Systemic veins: Inferior vena cava: The vessel was normal in size.  ------------------------------------------------------------------- Measurements   Left ventricle  Value          Reference  LV ID, ED, PLAX chordal          (L)     32    mm       43 - 52  LV ID, ES, PLAX chordal                  23    mm       23 - 38  LV fx shortening, PLAX chordal   (L)     28    %        >=29  LV PW thickness, ED                      14    mm        ----------  IVS/LV PW ratio, ED                      1              <=1.3  Stroke volume, 2D                        53    ml       ----------  Stroke volume/bsa, 2D                    27    ml/m^2   ----------  LV e&', lateral                           7.18  cm/s     ----------  LV E/e&', lateral                         10.54          ----------  LV e&', medial                            6.31  cm/s     ----------  LV E/e&', medial                          12             ----------  LV e&', average                           6.75  cm/s     ----------  LV E/e&', average                         11.22          ----------    Ventricular septum                       Value          Reference  IVS thickness, ED                        14    mm       ----------    LVOT  Value          Reference  LVOT ID, S                               20    mm       ----------  LVOT area                                3.14  cm^2     ----------  LVOT peak velocity, S                    71.7  cm/s     ----------  LVOT mean velocity, S                    50.5  cm/s     ----------  LVOT VTI, S                              16.8  cm       ----------    Aortic valve                             Value          Reference  Aortic valve peak velocity, S            527   cm/s     ----------  Aortic valve mean velocity, S            400   cm/s     ----------  Aortic valve VTI, S                      130   cm       ----------  Aortic mean gradient, S                  71    mm Hg    ----------  Aortic peak gradient, S                  111   mm Hg    ----------  VTI ratio, LVOT/AV                       0.13           ----------  Aortic valve area, VTI                   0.41  cm^2     ----------  Aortic valve area/bsa, VTI               0.21  cm^2/m^2 ----------  Velocity ratio, peak, LVOT/AV            0.14           ----------  Aortic valve area, peak velocity         0.43  cm^2      ----------  Aortic valve area/bsa, peak              0.22  cm^2/m^2 ----------  velocity  Velocity ratio, mean, LVOT/AV            0.13           ----------  Aortic valve  area, mean velocity         0.4   cm^2     ----------  Aortic valve area/bsa, mean              0.2   cm^2/m^2 ----------  velocity    Aorta                                    Value          Reference  Aortic root ID, ED                       27    mm       ----------    Left atrium                              Value          Reference  LA ID, A-P, ES                           41    mm       ----------  LA ID/bsa, A-P                           2.05  cm/m^2   <=2.2  LA volume, S                             52.7  ml       ----------  LA volume/bsa, S                         26.4  ml/m^2   ----------  LA volume, ES, 1-p A4C                   44.3  ml       ----------  LA volume/bsa, ES, 1-p A4C               22.2  ml/m^2   ----------  LA volume, ES, 1-p A2C                   57.9  ml       ----------  LA volume/bsa, ES, 1-p A2C               29    ml/m^2   ----------    Mitral valve                             Value          Reference  Mitral E-wave peak velocity              75.7  cm/s     ----------  Mitral A-wave peak velocity              104   cm/s     ----------  Mitral deceleration time         (H)     232   ms       150 - 230  Mitral pressure half-time  68    ms       ----------  Mitral peak gradient, D                  2     mm Hg    ----------  Mitral E/A ratio, peak                   0.7            ----------  Mitral valve area, PHT, DP               3.24  cm^2     ----------  Mitral valve area/bsa, PHT, DP           1.62  cm^2/m^2 ----------    Right atrium                             Value          Reference  RA ID, S-I, ES, A4C                      48.7  mm       34 - 49  RA area, ES, A4C                         16    cm^2     8.3 - 19.5  RA volume, ES, A/L                       45.3  ml        ----------  RA volume/bsa, ES, A/L                   22.7  ml/m^2   ----------    Systemic veins                           Value          Reference  Estimated CVP                            3     mm Hg    ----------    Right ventricle                          Value          Reference  TAPSE                                    27    mm       ----------  RV s&', lateral, S                        18    cm/s     ----------  Legend: (L)  and  (H)  mark values outside specified reference range.  EKG:  EKG is not ordered today. The ekg ordered today demonstrates   Recent Labs: 10/09/2017: Hemoglobin 12.9; Platelets 177 10/13/2017: ALT 38; BUN 15; Creatinine, Ser 0.90; Potassium 3.9; Sodium 141     Wt Readings from Last 3 Encounters:  10/08/17 180 lb (81.6 kg)  08/18/17 183 lb 6.4  oz (83.2 kg)  04/01/17 187 lb 3.2 oz (84.9 kg)     Other studies Reviewed: Additional studies/ records that were reviewed today include: . Review of the above records demonstrates:    Assessment and Plan:   1. Severe aortic valve stenosis: He has severe, stage D aortic valve stenosis. I have personally reviewed the echo images. The aortic valve is thickened, calcified with limited leaflet mobility. I think he would benefit from AVR. Given advanced age, he is not a good candidate for conventional AVR by surgical approach. I think he may be a good candidate for TAVR. This will be complicated by his recent stroke and high grade carotid disease.  STS Risk Score:  Risk of Mortality:  5.023% Renal Failure:  5.332% Permanent Stroke:  5.311% Prolonged Ventilation:  11.688% DSW Infection:  0.109% Reoperation:  4.394% Morbidity or Mortality:  19.398% Short Length of Stay:  14.211% Long Length of Stay:  14.509%  I have reviewed the natural history of aortic stenosis with the patient and their family members  who are present today. We have discussed the limitations of medical therapy and the poor  prognosis associated with symptomatic aortic stenosis. We have reviewed potential treatment options, including palliative medical therapy, conventional surgical aortic valve replacement, and transcatheter aortic valve replacement. We discussed treatment options in the context of the patient's specific comorbid medical conditions.    He would like to proceed with planning for TAVR. I will arrange a right and left heart catheterization at [facility] 11/04/17. This will allow one month to recover from his CVA. Risks and benefits of the cath procedure and the TAVR procedure are reviewed with the patient. After the cath, he will have a cardiac CT, CTA of the chest/abdomen and pelvis, PFTs and will then be referred to see one of the CT surgeons on our TAVR team. We will begin his workup for TAVR now. He will need one month to recover from his CVA.   He will pick up his 30 day event monitor today. This was arranged by the discharging team.    Current medicines are reviewed at length with the patient today.  The patient does not have concerns regarding medicines.  The following changes have been made:  no change  Labs/ tests ordered today include:   Orders Placed This Encounter  Procedures  . Basic metabolic panel  . CBC  . INR/PT     Disposition:   FU with the TAVR team.    Signed, Verne Carrow, MD 10/19/2017 11:52 AM    [facility] [address] [place], [place], [place]  [address] Phone: [phone]; Fax: [phone]

## 2017-10-19 NOTE — Patient Instructions (Signed)
Medication Instructions:  Your physician recommends that you continue on your current medications as directed. Please refer to the Current Medication list given to you today.  Labwork: Your physician recommends that you return for lab work on November 02, 2017 at our office.    Testing/Procedures: Your physician has requested that you have a cardiac catheterization. Cardiac catheterization is used to diagnose and/or treat various heart conditions. Doctors may recommend this procedure for a number of different reasons. The most common reason is to evaluate chest pain. Chest pain can be a symptom of coronary artery disease (CAD), and cardiac catheterization can show whether plaque is narrowing or blocking your heart's arteries. This procedure is also used to evaluate the valves, as well as measure the blood flow and oxygen levels in different parts of your heart. For further information please visit [email or web address] Please follow instruction sheet, as given.   Follow-Up: Samuel GuttingLauren Brown, RN will contact you with your follow up appointments.   Any Other Special Instructions Will Be Listed Below (If Applicable).    [facility] [address] OFF[facility] [address], [address] [place] [address] Dept: [phone] Loc: [phone]  Samuel Vega  10/19/2017  You are scheduled for a Cardiac Catheterization on Wednesday, March 27 with Dr. Verne Carrowhristopher Vega.  1. Please [facility] at [facility]: [address] [place], [place] [address] at 6:30 AM (two hours before your procedure to ensure your preparation). Free valet parking service is available.   Special note: Every effort is made to have your procedure done on time. Please understand that emergencies sometimes delay scheduled procedures.  2. Diet: Do not eat or drink anything after midnight prior to your procedure except sips  of water to take medications.  3. Labs: You will need to have blood drawn [facility] Monday, March [facility], [place]  Open: 7:30am - 5pm    Phone: [phone]. You do not need to be fasting.  4. Medication instructions in preparation for your procedure:    On the morning of your procedure, take your Aspirin and any morning medicines NOT listed above.  You may use sips of water.  5. Plan for one night stay--bring personal belongings. 6. Bring a current list of your medications and current insurance cards. 7. You MUST have a responsible person to drive you home. 8. Someone MUST be with you the first 24 hours after you arrive home or your discharge will be delayed. 9. Please wear clothes that are easy to get on and off and wear slip-on shoes.  Thank you for allowing us to care for you!   -- [facility]    If you need a refill on your cardiac medications before your next appointment, please call your pharmacy.

## 2017-10-21 DIAGNOSIS — R2681 Unsteadiness on feet: Secondary | ICD-10-CM | POA: Diagnosis not present

## 2017-10-21 DIAGNOSIS — G4700 Insomnia, unspecified: Secondary | ICD-10-CM | POA: Diagnosis not present

## 2017-10-21 DIAGNOSIS — R55 Syncope and collapse: Secondary | ICD-10-CM | POA: Diagnosis not present

## 2017-10-23 ENCOUNTER — Encounter: Payer: Self-pay | Admitting: *Deleted

## 2017-10-23 ENCOUNTER — Other Ambulatory Visit: Payer: Self-pay | Admitting: *Deleted

## 2017-10-23 DIAGNOSIS — R2681 Unsteadiness on feet: Secondary | ICD-10-CM | POA: Diagnosis not present

## 2017-10-23 NOTE — Patient Outreach (Signed)
[facility]) Care Management  10/23/2017  Samuel Vega [birth year] [id number]   CSW was able to make contact with patient today to perform the initial assessment, as well as assess and assist with social work needs and services.  CSW met with patient at [facility] where patient currently resides to receive short-term rehabilitative services.  Also present were patient's son, Samuel Vega and daughter-in-law, Samuel Vega.  CSW introduced self, explained role and types of services provided through [facility] ([facility]).  CSW further explained to patient that CSW works with patient's Primary Care Physician, Dr. Harrison Mons, also with [facility]. CSW then explained the reason for the visit, indicating that CSW thought that patient would benefit from social work services and resources to assist with discharge planning needs and services from the skilled nursing facility.  CSW obtained two HIPAA compliant identifiers from patient, which included patient's name and date of birth. Patient admitted that he recently suffered a Stroke, which effected the left side of his body.  Patient stated that he is working well with therapies (both physical and occupational), but reported that he does not agree with certain techniques that are being used to assist him with transfers.  Patient went on to say that he has fallen at the facility trying to transfer from the bed to the recliner.  Mrs. Doffing reported that patient has been shown various techniques for getting himself in and out of the bed and the recliner, but patient refuses to utilize them. CSW is concerned that perhaps it is a memory issue, as patient has been diagnosed with Dementia and is experiencing memory deficits.   Patient's son indicated that he has tried to talk with patient about possible placement into an assisted living facility; however,  patient is adamant about returning home to live independently.  Patient currently lives alone in a condo, reporting that he is able to perform all activities of daily living independently.  [name] is concerned about patient living alone and reported that he plans to continue to have this discussion with patient.  CSW agreed to assist with the placement process, if patient is agreeable.  [name] reported that he is patient's Press photographer and that he needs to begin to make appropriate decisions on patient's behalf, fearing that patient is unaware of his current limitations.  CSW also agreed to assist with arranging home care services for patient, as well as durable medical equipment, if necessary, in the event that patient were to return home. CSW provided [name] with CSW's contact information, encouraging them to contact CSW directly for CSW to assist with discharge planning needs and services.  [name] indicated that she would be in contact with CSW to voice her concerns about patient's ability to make appropriate decisions, as well as his after-care plans.  CSW voiced understanding and was agreeable to this plan.  CSW will make arrangements to meet with patient at [facility] again for the next visit on Friday, November 06, 2017.  No additional social work needs have been identified at this time. [facility] CM Care Plan Problem One     Most Recent Value  Care Plan Problem One  Level of care issues.  Role Documenting the Problem One  Clinical Social Worker  Care Plan for Problem One  Active  [facility] Long Term Goal   Patient will have home health services and durable medical equipment in  place at time of discharge, within the next 45 days.  [facility] Long Term Goal Start Date  10/23/17  Interventions for Problem One Long Term Goal  CSW will assist patient and discharge planning coordinator at the skilled facility with arranging home care services for patient prior to  discharge.  [facility] CM Short Term Goal #1   Patient will decide on a home health agency of choice, with regards to home care services, within the next three weeks.  [facility] CM Short Term Goal #1 Start Date  10/23/17  Interventions for Short Term Goal #1  Patient has been given a list of home care services for his review.  [facility] CM Short Term Goal #2   Patient will decide on an agency of choice, with regards to durable medical equipment, within the next three weeks.  [facility] CM Short Term Goal #2 Start Date  10/23/17  Interventions for Short Term Goal #2  Patient has been given a list of agencies for his review.    Nat Christen, BSW, MSW, LCSW  Licensed Education officer, [facility]  [address] [place] [address] [place], [place] [address] Physical Address-[address], [place], [place] [address] Toll Free Main # [phone] Fax # [phone] Cell # [phone]  Office # [phone] [email or web address]

## 2017-10-26 ENCOUNTER — Emergency Department (HOSPITAL_COMMUNITY): Payer: PPO

## 2017-10-26 ENCOUNTER — Other Ambulatory Visit: Payer: Self-pay

## 2017-10-26 ENCOUNTER — Inpatient Hospital Stay (HOSPITAL_COMMUNITY)
Admission: EM | Admit: 2017-10-26 | Discharge: 2017-10-30 | DRG: 101 | Disposition: A | Discharge status: Skilled Nursing Facility | Payer: PPO | Attending: Family Medicine | Admitting: Family Medicine

## 2017-10-26 ENCOUNTER — Encounter (HOSPITAL_COMMUNITY): Payer: Self-pay

## 2017-10-26 ENCOUNTER — Observation Stay (HOSPITAL_COMMUNITY): Payer: PPO

## 2017-10-26 DIAGNOSIS — R1312 Dysphagia, oropharyngeal phase: Secondary | ICD-10-CM | POA: Diagnosis not present

## 2017-10-26 DIAGNOSIS — F4310 Post-traumatic stress disorder, unspecified: Secondary | ICD-10-CM | POA: Diagnosis present

## 2017-10-26 DIAGNOSIS — H3530 Unspecified macular degeneration: Secondary | ICD-10-CM | POA: Diagnosis present

## 2017-10-26 DIAGNOSIS — G40109 Localization-related (focal) (partial) symptomatic epilepsy and epileptic syndromes with simple partial seizures, not intractable, without status epilepticus: Secondary | ICD-10-CM | POA: Diagnosis not present

## 2017-10-26 DIAGNOSIS — E785 Hyperlipidemia, unspecified: Secondary | ICD-10-CM | POA: Diagnosis present

## 2017-10-26 DIAGNOSIS — Z8673 Personal history of transient ischemic attack (TIA), and cerebral infarction without residual deficits: Secondary | ICD-10-CM

## 2017-10-26 DIAGNOSIS — I6521 Occlusion and stenosis of right carotid artery: Secondary | ICD-10-CM | POA: Diagnosis present

## 2017-10-26 DIAGNOSIS — I350 Nonrheumatic aortic (valve) stenosis: Secondary | ICD-10-CM

## 2017-10-26 DIAGNOSIS — Z7982 Long term (current) use of aspirin: Secondary | ICD-10-CM | POA: Diagnosis not present

## 2017-10-26 DIAGNOSIS — R569 Unspecified convulsions: Secondary | ICD-10-CM

## 2017-10-26 DIAGNOSIS — M6281 Muscle weakness (generalized): Secondary | ICD-10-CM | POA: Diagnosis not present

## 2017-10-26 DIAGNOSIS — R29810 Facial weakness: Secondary | ICD-10-CM | POA: Diagnosis present

## 2017-10-26 DIAGNOSIS — R2681 Unsteadiness on feet: Secondary | ICD-10-CM | POA: Diagnosis not present

## 2017-10-26 DIAGNOSIS — M6389 Disorders of muscle in diseases classified elsewhere, multiple sites: Secondary | ICD-10-CM | POA: Diagnosis not present

## 2017-10-26 DIAGNOSIS — G4089 Other seizures: Principal | ICD-10-CM | POA: Diagnosis present

## 2017-10-26 DIAGNOSIS — R402441 Other coma, without documented Glasgow coma scale score, or with partial score reported, in the field [EMT or ambulance]: Secondary | ICD-10-CM | POA: Diagnosis not present

## 2017-10-26 DIAGNOSIS — R278 Other lack of coordination: Secondary | ICD-10-CM | POA: Diagnosis not present

## 2017-10-26 DIAGNOSIS — Z8249 Family history of ischemic heart disease and other diseases of the circulatory system: Secondary | ICD-10-CM

## 2017-10-26 DIAGNOSIS — Z7401 Bed confinement status: Secondary | ICD-10-CM | POA: Diagnosis not present

## 2017-10-26 DIAGNOSIS — I63421 Cerebral infarction due to embolism of right anterior cerebral artery: Secondary | ICD-10-CM | POA: Diagnosis present

## 2017-10-26 DIAGNOSIS — R251 Tremor, unspecified: Secondary | ICD-10-CM | POA: Diagnosis not present

## 2017-10-26 DIAGNOSIS — I639 Cerebral infarction, unspecified: Secondary | ICD-10-CM | POA: Diagnosis not present

## 2017-10-26 DIAGNOSIS — G464 Cerebellar stroke syndrome: Secondary | ICD-10-CM | POA: Diagnosis not present

## 2017-10-26 DIAGNOSIS — I69318 Other symptoms and signs involving cognitive functions following cerebral infarction: Secondary | ICD-10-CM | POA: Diagnosis not present

## 2017-10-26 DIAGNOSIS — R41841 Cognitive communication deficit: Secondary | ICD-10-CM | POA: Diagnosis not present

## 2017-10-26 LAB — I-STAT TROPONIN, ED: Troponin i, poc: 0.04 ng/mL (ref 0.00–0.08)

## 2017-10-26 LAB — DIFFERENTIAL
BASOS ABS: 0 10*3/uL (ref 0.0–0.1)
Basophils Relative: 0 %
Eosinophils Absolute: 0.1 10*3/uL (ref 0.0–0.7)
Eosinophils Relative: 1 %
Lymphocytes Relative: 27 %
Lymphs Abs: 1.8 10*3/uL (ref 0.7–4.0)
MONOS PCT: 8 %
Monocytes Absolute: 0.5 10*3/uL (ref 0.1–1.0)
NEUTROS ABS: 4.3 10*3/uL (ref 1.7–7.7)
NEUTROS PCT: 64 %

## 2017-10-26 LAB — URINALYSIS, ROUTINE W REFLEX MICROSCOPIC
Bacteria, UA: NONE SEEN
Bilirubin Urine: NEGATIVE
GLUCOSE, UA: NEGATIVE mg/dL
KETONES UR: NEGATIVE mg/dL
Leukocytes, UA: NEGATIVE
Nitrite: NEGATIVE
PH: 6 (ref 5.0–8.0)
Protein, ur: NEGATIVE mg/dL
SQUAMOUS EPITHELIAL / LPF: NONE SEEN
Specific Gravity, Urine: 1.013 (ref 1.005–1.030)

## 2017-10-26 LAB — I-STAT CHEM 8, ED
BUN: 10 mg/dL (ref 6–20)
CHLORIDE: 107 mmol/L (ref 101–111)
CREATININE: 0.9 mg/dL (ref 0.61–1.24)
Calcium, Ion: 1.13 mmol/L — ABNORMAL LOW (ref 1.15–1.40)
GLUCOSE: 95 mg/dL (ref 65–99)
HCT: 39 % (ref 39.0–52.0)
Hemoglobin: 13.3 g/dL (ref 13.0–17.0)
POTASSIUM: 3.8 mmol/L (ref 3.5–5.1)
Sodium: 141 mmol/L (ref 135–145)
TCO2: 22 mmol/L (ref 22–32)

## 2017-10-26 LAB — COMPREHENSIVE METABOLIC PANEL
ALBUMIN: 3.3 g/dL — AB (ref 3.5–5.0)
ALT: 12 U/L — AB (ref 17–63)
AST: 23 U/L (ref 15–41)
Alkaline Phosphatase: 88 U/L (ref 38–126)
Anion gap: 10 (ref 5–15)
BUN: 9 mg/dL (ref 6–20)
CHLORIDE: 107 mmol/L (ref 101–111)
CO2: 22 mmol/L (ref 22–32)
CREATININE: 0.96 mg/dL (ref 0.61–1.24)
Calcium: 8.8 mg/dL — ABNORMAL LOW (ref 8.9–10.3)
GFR calc Af Amer: >60 mL/min (ref >60)
GLUCOSE: 98 mg/dL (ref 65–99)
Potassium: 3.8 mmol/L (ref 3.5–5.1)
SODIUM: 139 mmol/L (ref 135–145)
Total Bilirubin: 0.7 mg/dL (ref 0.3–1.2)
Total Protein: 6.4 g/dL — ABNORMAL LOW (ref 6.5–8.1)

## 2017-10-26 LAB — APTT: APTT: 37 s — AB (ref 24–36)

## 2017-10-26 LAB — PROTIME-INR
INR: 1.04
Prothrombin Time: 13.5 seconds (ref 11.4–15.2)

## 2017-10-26 LAB — CBC
HEMATOCRIT: 39.3 % (ref 39.0–52.0)
HEMOGLOBIN: 12.5 g/dL — AB (ref 13.0–17.0)
MCH: 27.5 pg (ref 26.0–34.0)
MCHC: 31.8 g/dL (ref 30.0–36.0)
MCV: 86.4 fL (ref 78.0–100.0)
Platelets: 185 10*3/uL (ref 150–400)
RBC: 4.55 MIL/uL (ref 4.22–5.81)
RDW: 15.7 % — AB (ref 11.5–15.5)
WBC: 6.7 10*3/uL (ref 4.0–10.5)

## 2017-10-26 LAB — RAPID URINE DRUG SCREEN, HOSP PERFORMED
AMPHETAMINES: NOT DETECTED
BARBITURATES: NOT DETECTED
BENZODIAZEPINES: POSITIVE — AB
Cocaine: NOT DETECTED
Opiates: NOT DETECTED
TETRAHYDROCANNABINOL: NOT DETECTED

## 2017-10-26 LAB — CK: CK TOTAL: 72 U/L (ref 49–397)

## 2017-10-26 LAB — ETHANOL

## 2017-10-26 LAB — MAGNESIUM: Magnesium: 1.9 mg/dL (ref 1.7–2.4)

## 2017-10-26 MED ORDER — LORAZEPAM 2 MG/ML IJ SOLN
1.0000 mg | Freq: Once | INTRAMUSCULAR | Status: AC
  Administered 2017-10-26: 1 mg via INTRAVENOUS
  Filled 2017-10-26: qty 1

## 2017-10-26 MED ORDER — DOCUSATE SODIUM 100 MG PO CAPS
100.0000 mg | ORAL_CAPSULE | Freq: Two times a day (BID) | ORAL | Status: DC
  Administered 2017-10-26 – 2017-10-30 (×9): 100 mg via ORAL
  Filled 2017-10-26 (×9): qty 1

## 2017-10-26 MED ORDER — ASPIRIN 325 MG PO TABS
325.0000 mg | ORAL_TABLET | Freq: Every day | ORAL | Status: DC
  Administered 2017-10-26 – 2017-10-30 (×5): 325 mg via ORAL
  Filled 2017-10-26 (×5): qty 1

## 2017-10-26 MED ORDER — LORAZEPAM 2 MG/ML IJ SOLN
1.0000 mg | INTRAMUSCULAR | Status: DC | PRN
  Administered 2017-10-26 – 2017-10-29 (×2): 1 mg via INTRAVENOUS
  Filled 2017-10-26 (×2): qty 1

## 2017-10-26 MED ORDER — PANTOPRAZOLE SODIUM 40 MG IV SOLR
40.0000 mg | INTRAVENOUS | Status: DC
  Administered 2017-10-26: 40 mg via INTRAVENOUS
  Filled 2017-10-26: qty 40

## 2017-10-26 MED ORDER — LEVETIRACETAM IN NACL 1000 MG/100ML IV SOLN
1000.0000 mg | Freq: Once | INTRAVENOUS | Status: AC
  Administered 2017-10-26: 1000 mg via INTRAVENOUS
  Filled 2017-10-26: qty 100

## 2017-10-26 MED ORDER — ENOXAPARIN SODIUM 40 MG/0.4ML ~~LOC~~ SOLN
40.0000 mg | SUBCUTANEOUS | Status: DC
  Administered 2017-10-26 – 2017-10-29 (×4): 40 mg via SUBCUTANEOUS
  Filled 2017-10-26 (×4): qty 0.4

## 2017-10-26 MED ORDER — SODIUM CHLORIDE 0.9 % IV SOLN
75.0000 mL/h | INTRAVENOUS | Status: DC
  Administered 2017-10-26 – 2017-10-27 (×2): 75 mL/h via INTRAVENOUS

## 2017-10-26 MED ORDER — ACETAMINOPHEN 325 MG PO TABS: 650.0000 mg | ORAL_TABLET | ORAL | Status: DC | PRN

## 2017-10-26 MED ORDER — TRAZODONE HCL 50 MG PO TABS
50.0000 mg | ORAL_TABLET | Freq: Every evening | ORAL | Status: DC | PRN
  Administered 2017-10-26: 50 mg via ORAL
  Filled 2017-10-26: qty 1

## 2017-10-26 MED ORDER — ONDANSETRON HCL 4 MG PO TABS: 4.0000 mg | ORAL_TABLET | Freq: Four times a day (QID) | ORAL | Status: DC | PRN

## 2017-10-26 MED ORDER — LORAZEPAM 2 MG/ML IJ SOLN
INTRAMUSCULAR | Status: AC
  Filled 2017-10-26: qty 1

## 2017-10-26 MED ORDER — ACETAMINOPHEN 650 MG RE SUPP: 650.0000 mg | RECTAL | Status: DC | PRN

## 2017-10-26 MED ORDER — LEVETIRACETAM IN NACL 500 MG/100ML IV SOLN
500.0000 mg | Freq: Two times a day (BID) | INTRAVENOUS | Status: DC
  Administered 2017-10-26 – 2017-10-27 (×2): 500 mg via INTRAVENOUS
  Filled 2017-10-26 (×2): qty 100

## 2017-10-26 MED ORDER — ONDANSETRON HCL 4 MG/2ML IJ SOLN: 4.0000 mg | Freq: Four times a day (QID) | INTRAMUSCULAR | Status: DC | PRN

## 2017-10-26 NOTE — Progress Notes (Signed)
Thank you for consult on Mr. Samuel Vega. Chart reviewed--[facility] stroke 2/28 and was admitted with new onset seizure activity. He lives alone with no local family and advanced age. Recommend [facility].

## 2017-10-26 NOTE — Evaluation (Signed)
Clinical/Bedside Swallow Evaluation Patient Details  Name: Samuel Vega MRN: [id number] Date of Birth: 10/29/1923  Today's Date: 10/26/2017 Time: SLP Start Time (ACUTE ONLY): 1555 SLP Stop Time (ACUTE ONLY): 1614 SLP Time Calculation (min) (ACUTE ONLY): 19 min  Past Medical History:  Past Medical History:  Diagnosis Date  . Anxiety   . Cardiac murmur   . Chronic back pain   . CVA (cerebral vascular accident) (HCC)   . ED (erectile dysfunction)   . Hyperlipidemia   . Macular degeneration   . PTSD (post-traumatic stress disorder)   . 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 ELBOW SURGERY    . EYE SURGERY     HPI:  Samuel Vega is a 84 yo M with a pmhx significant for severe R ICA stenosis and recent admission for embolic R anterior cerebral artery CVA two weeks ago. Patient reports getting out of bed at 2:30 am when he developed uncontrollable twitching of his left abdomen muscles and left shoulder, with new onset slurred speech per son's report. At baseline patient has mild residual left sided weakness. MRI (3/18) revealed typical evolutionary change for subacute RIGHT parietal infarction, without new areas of cerebral infarction. EEG (3/18) revealed slowing of the posterior dominant rhythm and additional focal slowing over the right posterior quadrant   Assessment / Plan / Recommendation Clinical Impression   Pt presents with overtly functional swallow function as indicated by independent ability to manipulate and clear oral cavity, swift swallow, and no signs/sypmtoms concerning for aspiration for all PO trials. Pt was noted to have increased impulsivity with solid trials, benefiting from Mod verbal cues to slow down and fully chew bites. Further, pt's son reports coughing with lunch when pt ate too quickly. Therefore, recommend continuing regular  solids and thin liquid diet and initiating full supervision and aspiration precautions (minizing distractions, small/slow bites, siting upright for meals, and alternating liquids and solids to aid with meal pacing) to minimize risk with oral intake. Given observed swallow function, no further SLP follow up required. Please re-consult in the event of acute change.    SLP Visit Diagnosis: Dysphagia, unspecified (R13.10)    Aspiration Risk  Mild aspiration risk    Diet Recommendation Regular;Thin liquid   Liquid Administration via: Straw;Cup Medication Administration: Whole meds with liquid(as able) Supervision: Patient able to self feed;Full supervision/cueing for compensatory strategies Compensations: Minimize environmental distractions;Slow rate;Small sips/bites;Follow solids with liquid Postural Changes: Seated upright at 90 degrees;Remain upright for at least 30 minutes after po intake    Other  Recommendations Oral Care Recommendations: Oral care BID   Follow up Recommendations Other (comment)(tbd)      Frequency and Duration            Prognosis        Swallow Study   General HPI: Samuel Vega is a 84 yo M with a pmhx significant for severe R ICA stenosis and recent admission for embolic R anterior cerebral artery CVA two weeks ago. Patient reports getting out of bed at 2:30 am when he developed uncontrollable twitching of his left abdomen muscles and left shoulder, with new onset slurred speech per son's report. At baseline patient has mild residual left sided weakness. MRI (3/18) revealed typical evolutionary change for subacute RIGHT parietal infarction, without new areas of cerebral infarction. EEG (3/18) revealed slowing of  the posterior dominant rhythm and additional focal slowing over the right posterior quadrant Type of Study: Bedside Swallow Evaluation Previous Swallow Assessment: none in chart Diet Prior to this Study: Regular;Thin liquids Temperature Spikes  Noted: No Respiratory Status: Room air History of Recent Intubation: No Behavior/Cognition: Alert;Cooperative;Pleasant mood Oral Cavity Assessment: Within Functional Limits Oral Care Completed by SLP: No Oral Cavity - Dentition: Adequate natural dentition Vision: Functional for self-feeding Self-Feeding Abilities: Able to feed self Patient Positioning: Upright in bed Baseline Vocal Quality: Normal Volitional Cough: Weak Volitional Swallow: Able to elicit    Oral/Motor/Sensory Function Overall Oral Motor/Sensory Function: Mild impairment Facial ROM: Within Functional Limits Facial Symmetry: Abnormal symmetry left Facial Strength: Within Functional Limits Facial Sensation: Within Functional Limits Lingual ROM: Within Functional Limits Lingual Symmetry: Within Functional Limits Lingual Strength: Within Functional Limits Velum: Within Functional Limits Mandible: Within Functional Limits   Ice Chips Ice chips: Within functional limits Presentation: Spoon   Thin Liquid Thin Liquid: Within functional limits Presentation: Cup;Straw;Self Fed    Nectar Thick Nectar Thick Liquid: Not tested   Honey Thick Honey Thick Liquid: Not tested   Puree Puree: Within functional limits Presentation: Spoon;Self Fed   Solid   GO   [place] Bular Hickok SLP Student Clinician  Solid: Within functional limits Presentation: Self Fed        [place] Alcus Bradly 10/26/2017,4:33 PM

## 2017-10-26 NOTE — ED Provider Notes (Signed)
[facility]   CSN: [id number] Arrival date & time: 10/26/17  0459     History   Chief Complaint Chief Complaint  Patient presents with  . Spasms    HPI Concha NorwayWilliam T Sok is a 84 y.o. male.  HPI   Concha NorwayWilliam T Rottenberg is a 84 y.o. male, with a history of aortic stenosis, CVA, hyperlipidemia, and right ICA stenosis, presenting to the ED with muscle spasms.  Asked when they began patient responds with, "last time I was here in the hospital."  He states that he began in the abdomen and has now progressed to involve his left shoulder.  Patient denies falls/trauma, recent illness, chest pain, shortness of breath, abdominal pain, pain of any kind, headache, vision loss, fever/chills, or any other complaints.   Past Medical History:  Diagnosis Date  . Anxiety   . Cardiac murmur   . Chronic back pain   . CVA (cerebral vascular accident) (HCC)   . ED (erectile dysfunction)   . Hyperlipidemia   . Macular degeneration   . PTSD (post-traumatic stress disorder)   . 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 Dementia without behavioral disturbance   . Leukocytosis   . Acute blood loss anemia   . Acute CVA (cerebrovascular accident) (HCC) 10/09/2017  . Elevated troponin 10/09/2017  . Rhabdomyolysis 10/09/2017  . Fall   . Syncope   . Carotid stenosis, right   . Cerebrovascular accident (CVA) due to embolism of right anterior cerebral artery (HCC)   . Loss of weight 03/20/2014  . Chronic back pain   . Hyperlipidemia 07/29/2012  . Hearing loss 07/29/2012  . Macular degeneration   . Tinnitus   . ED (erectile dysfunction)   . PTSD (post-traumatic stress disorder)   . Aortic stenosis 09/11/2009    Past Surgical History:  Procedure Laterality Date  . ELBOW SURGERY    . EYE SURGERY         Home Medications    Prior to  Admission medications   Medication Sig Start Date End Date Taking? Authorizing Provider  aspirin 325 MG tablet Take 1 tablet (325 mg total) by mouth daily. 10/14/17   Penny PiaVega, [place], MD    Family History Family History  Problem Relation Age of Onset  . Hypertension Other     Social History Social History   Tobacco Use  . Smoking status: Never Smoker  . Smokeless tobacco: Never Used  Substance Use Topics  . Alcohol use: No    Comment: none since 1980  . Drug use: No     Allergies   Statins   Review of Systems Review of Systems  Constitutional: Negative for chills, diaphoresis and fever.  Respiratory: Negative for shortness of breath.   Cardiovascular: Negative for chest pain.  Gastrointestinal: Negative for abdominal pain, diarrhea, nausea and vomiting.  Neurological: Positive for tremors. Negative for dizziness, syncope, light-headedness and headaches.       Left shoulder and torso jerking   All other systems reviewed and are negative.    Physical Exam Updated Vital Signs BP (!) 114/49 (BP Location: Right Arm)   Pulse 73   Temp (!) 97.1 F (36.2 C) (Oral)   Resp 20   SpO2 99%   Physical Exam  Constitutional: He appears well-developed and well-nourished. No distress.  HENT:  Head: Normocephalic and  atraumatic.  Eyes: Conjunctivae are normal.  Neck: Neck supple.  Cardiovascular: Normal rate, regular rhythm, normal heart sounds and intact distal pulses.  Pulmonary/Chest: Effort normal and breath sounds normal. No respiratory distress.  Abdominal: Soft. There is no tenderness. There is no guarding.  Musculoskeletal: He exhibits no edema.  Lymphadenopathy:    He has no cervical adenopathy.  Neurological: He is alert.  Patient alert and oriented.  Appears to be handling oral secretions without difficulty.  No noted aphasia. The patient has noted left facial droop with smile and left arm drift. Jerking muscle contractions noted in the left shoulder and left  torso.  Grip strength weaker on the left. Strength in the bilateral lower extremities 5/5.  Strength in the right upper extremity 5/5. Patient is unsteady on his feet, suspected to be due to lower extremity instability, which patient states is normal for him.  Skin: Skin is warm and dry. He is not diaphoretic.  Psychiatric: He has a normal mood and affect. His behavior is normal.  Nursing note and vitals reviewed.    ED Treatments / Results  Labs (all labs ordered are listed, but only abnormal results are displayed) Labs Reviewed  APTT - Abnormal; Notable for the following components:      Result Value   aPTT 37 (*)    All other components within normal limits  CBC - Abnormal; Notable for the following components:   Hemoglobin 12.5 (*)    RDW 15.7 (*)    All other components within normal limits  COMPREHENSIVE METABOLIC PANEL - Abnormal; Notable for the following components:   Calcium 8.8 (*)    Total Protein 6.4 (*)    Albumin 3.3 (*)    ALT 12 (*)    All other components within normal limits  I-STAT CHEM 8, ED - Abnormal; Notable for the following components:   Calcium, Ion 1.13 (*)    All other components within normal limits  ETHANOL  PROTIME-INR  DIFFERENTIAL  MAGNESIUM  CK  RAPID URINE DRUG SCREEN, HOSP PERFORMED  URINALYSIS, ROUTINE W REFLEX MICROSCOPIC  I-STAT TROPONIN, ED    EKG  EKG Interpretation  Date/Time:  Monday October 26 2017 06:33:07 EDT Ventricular Rate:  69 PR Interval:    QRS Duration: 138 QT Interval:  428 QTC Calculation: 459 R Axis:   85 Text Interpretation:  Sinus rhythm with premature atrial contractions Right bundle branch block Confirmed by Geoffery Lyons ([id number]) on 10/26/2017 6:47:16 AM Also confirmed by Geoffery Lyons ([id number]), editor Elita Quick ([id number])  on 10/26/2017 7:04:43 AM       Radiology Ct Head Wo Contrast  Result Date: 10/26/2017 CLINICAL DATA:  Stroke 10/08/2016. Left-sided tremors today. History of dementia. EXAM:  CT HEAD WITHOUT CONTRAST TECHNIQUE: Contiguous axial images were obtained from the base of the skull through the vertex without intravenous contrast. COMPARISON:  10/09/2017 an MRI 10/09/2017 FINDINGS: Brain: Ventricles, cisterns and other CSF spaces within normal. There is evidence of patient's known subacute posterior right parietal infarct without significant change in size or distribution compared to recent MRI. Evidence of minimal chronic ischemic microvascular disease. No mass, significant mass effect or midline shift. No evidence of acute hemorrhage. Vascular: No hyperdense vessel or unexpected calcification. Skull: Normal. Negative for fracture or focal lesion. Sinuses/Orbits: No acute finding. Other: None. IMPRESSION: Evidence of patient's known subacute infarct over the right posterior parietal region without significant change. No new areas of infarction and no acute hemorrhage. Mild chronic ischemic microvascular disease. Electronically Signed  By: Elberta Fortis M.D.   On: 10/26/2017 07:22   Mr Brain Wo Contrast  Result Date: 10/26/2017 CLINICAL DATA:  Continued surveillance, recent stroke.Abdominal spasms. EXAM: MRI HEAD WITHOUT CONTRAST TECHNIQUE: Multiplanar, multiecho pulse sequences of the brain and surrounding structures were obtained without intravenous contrast. COMPARISON:  MRI brain 10/09/2017.  CT head 10/26/2017. FINDINGS: Brain: Subacute infarction redemonstrated in the RIGHT parietal lobe, central encephalomalacia, T2 and FLAIR hyperintensity, normalizing ADC with only mild restricted diffusion. Developing encephalomalacia centrally. No new areas of infarction. There is a small subacute subdural hematoma, RIGHT occipital subdural space, maximum 3 mm thickness, reference image 16 series 7, which in retrospect was present on the previous exam. There is no mass effect on the underlying brain. No midline shift. There was also a RIGHT subdural hygroma, which is essentially normalized on  today's exam. Atrophy and small vessel disease are stable. Vascular: Flow voids are maintained. Skull and upper cervical spine: Normal marrow signal. Sinuses/Orbits: Negative. Other: None. IMPRESSION: Typical evolutionary change for subacute RIGHT parietal infarction, without new areas of cerebral infarction. RIGHT subdural hygroma has essentially resolved, but there is residual subacute RIGHT 3 mm thick subdural hematoma, also present previously, and unchanged. This is a nonsurgical lesion. Atrophy and small vessel disease, not unexpected for age. Electronically Signed   By: Elsie Stain M.D.   On: 10/26/2017 09:31    Procedures Procedures (including critical care time)  Medications Ordered in ED Medications  levETIRAcetam (KEPPRA) IVPB 1000 mg/100 mL premix (not administered)  LORazepam (ATIVAN) injection 1 mg (1 mg Intravenous Given 10/26/17 0629)     Initial Impression / Assessment and Plan / ED Course  I have reviewed the triage vital signs and the nursing notes.  Pertinent labs & imaging results that were available during my care of the patient were reviewed by me and considered in my medical decision making (see chart for details).  Clinical Course as of Oct 26 949  Mon Oct 26, 2017  0641 Spoke with Selena Batten, nurse for patient at [facility]. States patient was complaining of numbness to the left face, arm, and leg beginning around 3:30 am. States she did not note any jerking or tremors.  No recent illness, no recent falls, or other recent complaints.  She is unaware of any underlying dementia or cognitive dysfunction.  [SJ]  0650 Muscle jerking has resolved.  Patient continues to have left-sided arm drift and left facial droop.  [SJ]  [phone] Spoke with Dr. Laurence Slate, off-going neurologist. States based on abnormalities noted on previous neurologist's note, he is less concerned for acute stroke and more concerned for focal seizure.  He does not recommend code stroke activation at this  time.  He will communicate with his oncoming colleague regarding this patient.  [SJ]  [id number] RN states patient's speech seems to be slurred.  I reevaluated the patient. Patient does seem to have some slurred speech. May be from the ativan. We will update neurology.  [SJ]  [id number] Spoke with Dr. Wilford Corner to update him. States his resident is coming to assess the patient.   [SJ]  [id number] Spoke with Dr. Wilford Corner following his evaluation of the patient.  Suspects patient had a seizure.  Does not suspect new stroke. Recommends Keppra loading dose 1000 mg and then 500 mg twice daily.  Admit via the hospitalist for observation. EEG during admission.  [SJ]  [id number] Spoke with Dr. Ophelia Charter, hospitalist. Agrees to admit the patient.   [SJ]    Clinical Course  User Index [SJ] Anselm Pancoast, PA-C    Patient presents with jerking in the left upper extremity and left torso.  Patient presentation gives concern for focal seizure. Resolved following ativan. Patient's other deficits were intermittently noted on exam as during his recent stroke. No acute abnormalities on head CT or MRI of the brain.  Patient otherwise noted to be stable during ED course. Admission via medicine service with neurology consultation.    Patient was admitted October 08, 2017 following a syncopal episode.  He was diagnosed with right parietal infarct. Patient noted to have 70-80% right ICA stenosis on CTA performed October 09, 2017.  Echo performed on October 10, 2017 shows EF 60-65%, critical aortic valve stenosis with moderate valve mobility.  Findings and plan of care discussed with Emily Filbert, MD. Dr. Judd Lien personally evaluated and examined this patient.   Vitals:   10/26/17 0530 10/26/17 0545 10/26/17 0600 10/26/17 0930  BP: 140/83 (!) 145/96 (!) 135/116 (!) 128/58  Pulse: 71 79 95 87  Resp: 19 18 (!) 22 18  Temp:      TempSrc:      SpO2: 98% 97% 98% 99%     Final Clinical Impressions(s) / ED Diagnoses   Final diagnoses:  Focal seizure [facility])     ED Discharge Orders    None       Anselm Pancoast, PA-C 10/26/17 4098    Geoffery Lyons, MD 10/27/17 [phone]

## 2017-10-26 NOTE — Consult Note (Addendum)
Neurology Consultation  Reason for Consult: Concern for new onset seizure Referring Physician: EDP  CC: Spasms  History is obtained from patient and son at bedside.   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 Patient reports getting out of bed at 2:30 am when he developed uncontrollable twitching of his left abdomen muscles and left shoulder. Patient felt that he was having another stroke and was brought in to the hospital via EMS. On arrival, patient was noted to have continuous jerking of his left upper extremity and torso that resolved following a dose of ativan. At baseline patient has mild residual left sided weakness. He can walk a few steps with a walker and perform most ADLs without assistance. He is currently residing at [facility] for rehabilitation after his stroke. Son also reports noticing slurred speech since arriving to the ED, which is new from his baseline.   LKW: 2:30 am tpa given?: no Premorbid modified Rankin scale (mRS):  0-Completely asymptomatic and back to baseline post-stroke 1-No significant post stroke disability and can perform usual duties with stroke symptoms 2-Slight disability-UNABLE to perform all activities but does not need assistance  3-Moderate disability-requires help but walks WITHOUT assistance 4-Needs assistance to walk and tend to bodily needs 5-Severe disability-bedridden, incontinent, needs constant attention 6- Death   ROS: ROS was performed and is negative except as noted in the HPI.   Past Medical History:  Diagnosis Date  . Anxiety   . Cardiac murmur   . Chronic back pain   . CVA (cerebral vascular accident) (HCC)   . ED (erectile dysfunction)   . Hyperlipidemia   . Macular degeneration   . PTSD (post-traumatic stress disorder)   . 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 Hypertension Other     Social History:  Social History   Socioeconomic History  . Marital status: Divorced    Spouse name: n/a  . Number of children: 2  . Years of education: Not on file  . Highest education level: Not on file  Social Needs  . Financial resource strain: Not on file  . Food insecurity - worry: Not on file  . Food insecurity - inability: Not on file  . Transportation needs - medical: Not on file  . Transportation needs - non-medical: Not on file  Occupational History  . Occupation: retired  Tobacco Use  . Smoking status: Never Smoker  . Smokeless tobacco: Never Used  Substance and Sexual Activity  . Alcohol use: No    Comment: none since 1980  . Drug use: No  . Sexual activity: Yes    Partners: Female  Other Topics Concern  . Not on file  Social History Narrative   Divorced in [place]. 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  His grandson is a Optometristpediatrician.    Medications No outpatient medications have been marked as taking for the 10/26/17 encounter [facility](Hospital Encounter).    Exam: Current vital signs: Vitals:   10/26/17 0600 10/26/17 0930  BP: (!) 135/116 (!) 128/58  Pulse: 95 87  Resp: (!) 22 18  Temp:    SpO2: 98% 99%   Vital signs in last 24 hours: Temp:  [97.1 F (36.2 C)] 97.1 F (36.2 C) (03/18 0517) Pulse Rate:  [66-95]  87 (03/18 0930) Resp:  [18-25] 18 (03/18 0930) BP: (114-145)/(49-116) 128/58 (03/18 0930) SpO2:  [97 %-99 %] 99 % (03/18 0930) Weight:  [180 lb (81.6 kg)] 180 lb (81.6 kg) (03/18 0837)  GENERAL: Awake, alert in NAD HEENT: - Normocephalic and atraumatic, dry mm, no LN++, no Thyromegally LUNGS - Breathing comfortable on RA ABDOMEN - Soft, nontender, nondistended Ext: warm, well perfused, intact peripheral pulses, no edema   NEURO:  Mental Status: AA&Ox3  Language: Speech is mildly slurred.  Naming, repetition, fluency, and comprehension intact. Cranial Nerves: EOMI, visual  fields full, subtle left facial droop, facial sensation intact, hearing decreased bilaterally, tongue/uvula/soft palate midline, normal sternocleidomastoid and trapezius muscle strength. No evidence of tongue atrophy or fibrillations Motor: Upper extremity 5/5 bilaterally, left lower extremity with drift - leg falls by the end of 5 second period. Right lower extremity 5/5.  Tone: is normal and bulk is normal Sensation- Intact to light touch bilaterally Gait- deferred   Labs I have reviewed labs in epic and the results pertinent to this consultation are: CBC, CMP within normal limits  CBC    Component Value Date/Time   WBC 6.7 10/26/2017 0624   RBC 4.55 10/26/2017 0624   HGB 13.3 10/26/2017 0630   HCT 39.0 10/26/2017 0630   PLT 185 10/26/2017 0624   MCV 86.4 10/26/2017 0624   MCH 27.5 10/26/2017 0624   MCHC 31.8 10/26/2017 0624   RDW 15.7 (H) 10/26/2017 0624   LYMPHSABS 1.8 10/26/2017 0624   MONOABS 0.5 10/26/2017 0624   EOSABS 0.1 10/26/2017 0624   BASOSABS 0.0 10/26/2017 0624   CMP     Component Value Date/Time   NA 141 10/26/2017 0630   K 3.8 10/26/2017 0630   CL 107 10/26/2017 0630   CO2 22 10/26/2017 0624   GLUCOSE 95 10/26/2017 0630   BUN 10 10/26/2017 0630   CREATININE 0.90 10/26/2017 0630   CALCIUM 8.8 (L) 10/26/2017 0624   PROT 6.4 (L) 10/26/2017 0624   ALBUMIN 3.3 (L) 10/26/2017 0624   AST 23 10/26/2017 0624   ALT 12 (L) 10/26/2017 0624   ALKPHOS 88 10/26/2017 0624   BILITOT 0.7 10/26/2017 0624   GFRNONAA >60 10/26/2017 0624   GFRAA >60 10/26/2017 0624   Lipid Panel     Component Value Date/Time   CHOL 235 (H) 10/09/2017 0456   TRIG 87 10/09/2017 0456   HDL 96 10/09/2017 0456   CHOLHDL 2.4 10/09/2017 0456   VLDL 17 10/09/2017 0456   LDLCALC 122 (H) 10/09/2017 0456    Imaging I have reviewed the images obtained CT-scan of the brain:  IMPRESSION: Evidence of patient's known subacute infarct over the right posterior parietal region without  significant change. No new areas of infarction and no acute hemorrhage. Mild chronic ischemic microvascular disease. MRI examination of the brain:  IMPRESSION: Typical evolutionary change for subacute RIGHT parietal infarction, without new areas of cerebral infarction. RIGHT subdural hygroma has essentially resolved, but there is residual subacute RIGHT 3 mm thick subdural hematoma, also present previously, and unchanged. This is a nonsurgical lesion. Atrophy and small vessel disease, not unexpected for age.  Assessment: 84 yo presenting with continuous left sided muscle twitching following a right parietal infarction two weeks ago. MRI shows evolving stroke with no new areas of stroke or bleed.  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   Recommendations: -- Obtain routine EEG -- Load 1 g Keppra followed  by 500 mg BID -- MRI without evidence of progression or recurrent CVA, will discuss with stroke team. Unlikely to impact surgical plans for carotid stenosis & AVR.  -- Please obtain 12 lead EKG given irregularity on monitor  --Frequent neurochecks -- Will follow   Reymundo Poll, M.D. - PGY2 10/26/2017, 10:12 AM   Attending Neurohospitalist Addendum Patient seen and examined with APP/Resident. Agree with the history and physical as documented above. Agree with the plan as documented, which I helped formulate. I have also made edits on the note above. I have independently reviewed the chart, obtained history, review of systems and examined the patient.I have personally reviewed pertinent head/neck/spine imaging (CT/MRI). --MRI shows no acute stroke, just evolution of the stroke seen on 3/1.No bleed. --Spoke with stroke team attending. No change in plan for timing of carotid revascularization. --Continue Keppra as above. --Will follow up on EEG --Maintain seizure precautions and use Ativan IV  for seizure lasting more than 5 min.  Please feel free to call with any questions. --- Milon Dikes, MD [facility] Pager: [phone] If 7pm to 7am, please call on call as listed on AMION.

## 2017-10-26 NOTE — Progress Notes (Signed)
Pt family voices concern about pt home heart monitoring off pt and Dr. Ophelia CharterYates notified; MD said pt is on continuous heart monitor here and cardiology can read the strips from here and pt home monitor can be placed back on once discharge. Will continue to monitor pt closely. Dionne BucyP. Amo Laine Fonner RN

## 2017-10-26 NOTE — ED Triage Notes (Signed)
Pt here via [facility] with c/o abd "spasms" since last night. Denies abd pain or any other pain. VSS. NAD. States "I just wish my belly would stop this". EMS reports staff called due to weakness, but upon arrival, pt had no weakness. Denies headache, chest pain, or sob. Good grips bilat. A&O x4. Hx dementia/confusion.

## 2017-10-26 NOTE — Procedures (Signed)
ELECTROENCEPHALOGRAM REPORT  Date of Study: 10/26/2017  Patient's Name: Samuel NorwayWilliam T Mallette MRN: [id number] Date of Birth: [birth year]  Referring Provider: Dr. Reymundo Pollarolyn Guilloud  Clinical History: This is a 84 year old man with an episode of twitching on the left abdomen and shoulder, altered mental status.  Medications: Keppra  Ativan  Technical Summary: A multichannel digital EEG recording measured by the international 10-20 system with electrodes applied with paste and impedances below 5000 ohms performed as portable with EKG monitoring in an awake and asleep patient.  Hyperventilation and photic stimulation were not performed.  The digital EEG was referentially recorded, reformatted, and digitally filtered in a variety of bipolar and referential montages for optimal display.   Description: The patient is awake and asleep during the recording.  During maximal wakefulness, there is a symmetric, medium voltage 7 Hz posterior dominant rhythm that attenuates with eye opening. There is polymorphic 4-6 Hz theta slowing over the right posterior quadrant region. During drowsiness and sleep, there is an increase in theta and delta slowing of the background with vertex waves and sleep spindles seen.  Hyperventilation and photic stimulation were not performed.  There were no epileptiform discharges or electrographic seizures seen.    EKG lead showed irregular rhythm.  Impression: This awake and asleep EEG is abnormal due to the presence of: 1. Slowing of the posterior dominant rhythm 2. Additional focal slowing over the right posterior quadrant  Clinical Correlation of the above findings indicates diffuse cerebral dysfunction that is non-specific in etiology and can be seen with hypoxic/ischemic injury, toxic/metabolic encephalopathies, neurodegenerative disorders, or medication effect.  Focal slowing over the right posterior quadrant indicates focal cerebral dysfunction suggestive of underlying  structural or physiologic abnormality. The absence of epileptiform discharges does not exclude a clinical diagnosis of epilepsy. Clinical correlation is advised.   Patrcia DollyKaren Adrean Findlay, M.D.

## 2017-10-26 NOTE — Progress Notes (Signed)
Admitted to 3W 19 from ED. Patient was alert and oriented , not in any distress, no abdominal twitching or jerking noted on admission, conversant, VSS. Oriented to unit and staff. Endorsed appropriately to the incoming nurse.

## 2017-10-26 NOTE — Progress Notes (Signed)
Dr, A. Patel consulted on pt last admit and recommended SNF rehab due to decreased caregiver support. Pt readmitted with new onset seizures this admit and SNF continues to be recommended dispo. We will sign off at this time. [phone]

## 2017-10-26 NOTE — ED Notes (Addendum)
MD to bedside. Pt now c/o left arm spasms that are unable to control. Pt still a&ox4.

## 2017-10-26 NOTE — Progress Notes (Signed)
Pt c/o jerking in his abdomen; prn ativan administered. Reported off to oncoming RN. Dionne BucyP. Amo Janett Kamath RN

## 2017-10-26 NOTE — H&P (Signed)
History and Physical    Samuel Vega ZOX:[id number] DOB: [birth year] DOA: 10/26/2017  PCP: Porfirio Oar, PA-C  Patient coming from: [facility] - recently admitted  Chief Complaint:  spasms  HPI: Samuel Vega is a 84 y.o. male with medical history significant of severe aortic stenosis, PTSD, and recent admission for CVA due to embolism of right anterior cerebral artery with severe R ICA stenosis now presenting with apparent seizure activity.  He got up this AM to open his door.  He was trying to get back in bed when a muscle started fasciculating in his left side and abdomen.  This lasted "longer than I wanted it to", maybe about an hour.  He did have this one time prior during his recent hospitalization for acute stroke.  He knew what was happening and was not confused "and I knew what caused it" - had to do with his room not having a vent and there was old air and it used the oxygen up.  He was dizzy.  He was not happy with the nursing center at [facility].   ED Course:  Recent CVA earlier this month.  Now focal seizure in left shoulder and torso.  Neurology is concerned about current seizure, resolved with Ativan.  Needs to come in for observation for new seizure, needs EEG.  Review of Systems: As per HPI; otherwise review of systems reviewed and negative.   Ambulatory Status:  Ambulates with a walker - walking "very little" at [facility]  Past Medical History:  Diagnosis Date  . Anxiety   . Cardiac murmur   . Chronic back pain   . CVA (cerebral vascular accident) (HCC)   . ED (erectile dysfunction)   . Hyperlipidemia   . Macular degeneration   . PTSD (post-traumatic stress disorder)   . 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 ELBOW SURGERY    . EYE SURGERY      Social History   Socioeconomic History  . Marital status: Divorced    Spouse name: n/a  .  Number of children: 2  . Years of education: Not on file  . Highest education level: Not on file  Social Needs  . Financial resource strain: Not on file  . Food insecurity - worry: Not on file  . Food insecurity - inability: Not on file  . Transportation needs - medical: Not on file  . Transportation needs - non-medical: Not on file  Occupational History  . Occupation: retired  Tobacco Use  . Smoking status: Never Smoker  . Smokeless tobacco: Never Used  Substance and Sexual Activity  . Alcohol use: No    Comment: none since 1980  . Drug use: No  . Sexual activity: Yes    Partners: Female  Other Topics Concern  . 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    Allergies  Allergen Reactions  . Statins Other (See Comments)    MYALGIAS    Family History  Problem Relation Age of Onset  . Hypertension Other     Prior to Admission medications   Medication Sig Start Date End Date Taking? Authorizing Provider  aspirin 325 MG tablet Take 1 tablet (325 mg total) by mouth daily. 10/14/17   Penny Pia, MD    Physical Exam: Vitals:  10/26/17 0545 10/26/17 0600 10/26/17 0930 10/26/17 1150  BP: (!) 145/96 (!) 135/116 (!) 128/58 126/60  Pulse: 79 95 87 66  Resp: 18 (!) 22 18 18   Temp:    98.5 F (36.9 C)  TempSrc:    Oral  SpO2: 97% 98% 99% 97%     General:  Appears calm and comfortable and is NAD Eyes:  PERRL, EOMI, normal lids, iris ENT: hard of hearing, normal lips & tongue, mmm Neck:  no LAD, masses or thyromegaly; no carotid bruits Cardiovascular:  RRR, 4/6 systolic murmur, no r/g. No LE edema.  Respiratory:   CTA bilaterally with no wheezes/rales/rhonchi.  Normal respiratory effort. Abdomen:  soft, NT, ND, NABS Skin:  no rash or induration seen on limited exam Musculoskeletal:  grossly normal tone BUE/BLE, slightly decreased strength on the left primarily with foot flexion, good ROM, no bony  abnormality Psychiatric:  grossly normal mood and affect, speech fluent and appropriate, AOx3; he made some sexually inappropriate comments throughout the history and physical Neurologic:  CN 2-12 grossly intact with mild left facial droop, moves all extremities in coordinated fashion, sensation intact    Radiological Exams on Admission: Ct Head Wo Contrast  Result Date: 10/26/2017 CLINICAL DATA:  Stroke 10/08/2016. Left-sided tremors today. History of dementia. EXAM: CT HEAD WITHOUT CONTRAST TECHNIQUE: Contiguous axial images were obtained from the base of the skull through the vertex without intravenous contrast. COMPARISON:  10/09/2017 an MRI 10/09/2017 FINDINGS: Brain: Ventricles, cisterns and other CSF spaces within normal. There is evidence of patient's known subacute posterior right parietal infarct without significant change in size or distribution compared to recent MRI. Evidence of minimal chronic ischemic microvascular disease. No mass, significant mass effect or midline shift. No evidence of acute hemorrhage. Vascular: No hyperdense vessel or unexpected calcification. Skull: Normal. Negative for fracture or focal lesion. Sinuses/Orbits: No acute finding. Other: None. IMPRESSION: Evidence of patient's known subacute infarct over the right posterior parietal region without significant change. No new areas of infarction and no acute hemorrhage. Mild chronic ischemic microvascular disease. Electronically Signed   By: Elberta Fortisaniel  Boyle M.D.   On: 10/26/2017 07:22   Mr Brain Wo Contrast  Result Date: 10/26/2017 CLINICAL DATA:  Continued surveillance, recent stroke.Abdominal spasms. EXAM: MRI HEAD WITHOUT CONTRAST TECHNIQUE: Multiplanar, multiecho pulse sequences of the brain and surrounding structures were obtained without intravenous contrast. COMPARISON:  MRI brain 10/09/2017.  CT head 10/26/2017. FINDINGS: Brain: Subacute infarction redemonstrated in the RIGHT parietal lobe, central  encephalomalacia, T2 and FLAIR hyperintensity, normalizing ADC with only mild restricted diffusion. Developing encephalomalacia centrally. No new areas of infarction. There is a small subacute subdural hematoma, RIGHT occipital subdural space, maximum 3 mm thickness, reference image 16 series 7, which in retrospect was present on the previous exam. There is no mass effect on the underlying brain. No midline shift. There was also a RIGHT subdural hygroma, which is essentially normalized on today's exam. Atrophy and small vessel disease are stable. Vascular: Flow voids are maintained. Skull and upper cervical spine: Normal marrow signal. Sinuses/Orbits: Negative. Other: None. IMPRESSION: Typical evolutionary change for subacute RIGHT parietal infarction, without new areas of cerebral infarction. RIGHT subdural hygroma has essentially resolved, but there is residual subacute RIGHT 3 mm thick subdural hematoma, also present previously, and unchanged. This is a nonsurgical lesion. Atrophy and small vessel disease, not unexpected for age. Electronically Signed   By: Elsie StainJohn T Curnes M.D.   On: 10/26/2017 09:31    EKG: Independently reviewed.  NSR with rate  69; PACs and RBBB   Labs on Admission: I have personally reviewed the available labs and imaging studies at the time of the admission.  Pertinent labs:   Essentially normal BMP Ionized calcium 1.13 Mag 1.9 Albumin 3.3, improved CK 72 Troponin 0.04 Stable CBC (hgb 12.5, prior 12.9) ETOH negative  Assessment/Plan Principal Problem:   Seizure (HCC) Active Problems:   Aortic stenosis, severe   Carotid stenosis, right   Cerebrovascular accident (CVA) due to embolism of right anterior cerebral artery (HCC)   Seizure -Patient with recent CVA, so concern for recurrence - but CT and MRI both indicate improvement and natural stroke progression -Patient with apparent new focal seizure -Patient placed in observation overnight for further evaluation -It  would not be unexpected for a patient to have post-CVA seizure activity -Neurology consulted, seen by Dr. Wilford Corner -He will need EEG -He was loaded with Keppra 1 gram IV and then started on 500 mg PO BID -Seizure precautions -Ativan prn  Severe AS -Mean gradient 71 mmHg -He is high risk for now for TAVR given his recent stroke -Will need outpatient cardiology f/u  Recent CVA  -He was discharged to [facility] and is not receiving adequate rehab, per patient and family -They request repeat inpatient rehab evaluation to ascertain whether he is a candidate for inpatient therapy.  Rehab consult placed. -If not, they request assistance with obtaining an alternative location for ongoing SNF therapy - possibly [facility].  SW consult requested.  R carotid stenosis -Vascular requests full cardiac work-up prior to consideration of CEA -Vascular surgeon is planning to re-evaluate in 1 month  DVT prophylaxis:  Lovenox  Code Status: Full - confirmed with patient/family Family Communication: Son and nephew were present throughout evaluation Disposition Plan:  To be determined Consults called: Neurology; PM&R; SW Admission status: It is my clinical opinion that referral for OBSERVATION is reasonable and necessary in this patient based on the above information provided. The aforementioned taken together are felt to place the patient at high risk for further clinical deterioration. However it is anticipated that the patient may be medically stable for discharge from the hospital within 24 to 48 hours.    Jonah Blue MD [facility]  If note is complete, please contact covering daytime or nighttime physician. [email or web address] Password [facility]  10/26/2017, 12:14 PM

## 2017-10-26 NOTE — ED Notes (Signed)
Patient denies pain and is resting comfortably.  

## 2017-10-26 NOTE — ED Notes (Signed)
Pt back from CT

## 2017-10-26 NOTE — ED Notes (Signed)
Patient transported to CT 

## 2017-10-26 NOTE — Progress Notes (Signed)
Pt c/o acid reflux as well as c/o some difficulty swallowing while eating his lunch. MD notified and new orders received. Will continue to closely monitor. Dionne BucyP. Amo Kalinda Romaniello RN

## 2017-10-26 NOTE — Progress Notes (Signed)
Pt son wanted to know why pt can't go to CIR; PA Pam from rehab spoken to for clarification and recommended for pt to have a reason for to CIR need ie PT/OT evaluate pt with their recommendation. PT/OT order placed per protocol. Pt son notified and he voices understanding. Will continue to closely monitor pt. Dionne BucyP. Amo Honestie Kulik RN

## 2017-10-26 NOTE — Progress Notes (Signed)
EEG complete - results pending 

## 2017-10-26 NOTE — ED Notes (Signed)
Pt states that he needs to have a BM. Pt up to bedside potty. Pt is very weak when standing. EMS reports [facility] and can only take a few steps. Pt cannot stand without assistance.

## 2017-10-26 NOTE — Clinical Social Work Note (Signed)
Clinical Social Work Assessment  Patient Details  Name: Samuel Vega MRN: [id number] Date of Birth: 03/17/1924  Date of referral:  10/26/17               Reason for consult:  Facility Placement, Discharge Planning                Permission sought to share information with:  Family Supports Permission granted to share information::  No(Patient oriented to person, place and time)  Name::     Samuel Vega   Agency::     Relationship::  Son and Sister  Contact Information:  Argentina PonderSon Ronnie - [phone] and Curt BearsSister Kay -- [phone].  Housing/Transportation Living arrangements for the past 2 months:  Single Family Home(Patient owns a condo per son) Source of Information:  Adult Children Patient Interpreter Needed:  None Criminal Activity/Legal Involvement Pertinent to Current Situation/Hospitalization:  No - Comment as needed Significant Relationships:  Adult Children, Siblings, Other Family Members Lives with:  Self Do you feel safe going back to the place where you live?  No(Son in agreement with continued rehab. His first choice is [facility] or another facility other than [facility]) Need for family participation in patient care:  Yes (Comment)  Care giving concerns:  Patient's son, Samuel PantherRonnie Moret expressed several concerns regarding patient's care at the skilled nursing facility. Mr. Milta DeitersSlaughter does not want patient to return to this facility. His preference is inpatient rehab.  Social Worker assessment / plan: CSW talked with patient's son, Mr. Samuel Vega regarding his father's discharge disposition and the need for continued rehab in a facility. Mr. Milta DeitersSlaughter explained that his [facility] and left the facility early this morning (3/18) coming to hospital. Son informed CSW that his dad left the facility at approx. 4:15 am and he was called at 5:06 am. Mr. Milta DeitersSlaughter really desires for his dad to discharge [facility] and this  was discussed. Patient's nurse also  informed son of patient's inappropriateness for inpatient rehab as cited by [facility] staff person in progress note.  Son expressed his concern regarding the care his dad received at the SNF including one of his hearing aides being misplaced, giving his dad inappropriate medication, not addressing their concerns appropriately, etc. Mr. Milta DeitersSlaughter reported that after rehab, the family is desiring ALF placement for patient and have just began considering ALF places, and this was discussed.  Mr. Milta DeitersSlaughter was provided with a SNF list and expressed understanding of the need to initiate a SNF search, even in light of him wanting his dad reconsidered for inpatient rehab.  Employment status:  Retired Retail buyernsurance information:  Managed Medicare([employer]) PT Recommendations:  Not assessed at this time Information / Referral to community resources:  Skilled Nursing Facility(Son provided with SNF list)  Patient/Family's Response to care:  No concerns expressed by son regarding care during hospitalization.  Patient/Family's Understanding of and Emotional Response to Diagnosis, Current Treatment, and Prognosis:  Son expressed awareness of his dad medical condition and need for continued rehab. He was very adamant that his dad will not return to [facility] and informed CSW that he has removed all of his dad's belongings from the facility.  Emotional Assessment Appearance:  Appears stated age Attitude/Demeanor/Rapport:  Avoidant, Unable to Assess(Son stepped out of room to talk with CSW) Affect (typically observed):  Unable to Assess(Talked with son outside of room) Orientation:  Oriented to Place, Oriented to Self, Oriented to  Time  Alcohol / Substance use:  Tobacco Use, Alcohol Use, Illicit Drugs(Patient reported that he does not smoke, has not consumed alcohol since 1980 and does not use illicit drugs) Psych involvement (Current and /or in the community):  No  (Comment)  Discharge Needs  Concerns to be addressed:  Discharge Planning Concerns Readmission within the last 30 days:  Yes Current discharge risk:  None Barriers to Discharge:  Continued Medical Work up, English as a second language teacher, Other(Patient needs to hav PT/OT evals)   Cristobal Goldmann, LCSW 10/26/2017, 5:54 PM

## 2017-10-27 DIAGNOSIS — H3530 Unspecified macular degeneration: Secondary | ICD-10-CM | POA: Diagnosis present

## 2017-10-27 DIAGNOSIS — Z8249 Family history of ischemic heart disease and other diseases of the circulatory system: Secondary | ICD-10-CM | POA: Diagnosis not present

## 2017-10-27 DIAGNOSIS — I6521 Occlusion and stenosis of right carotid artery: Secondary | ICD-10-CM | POA: Diagnosis present

## 2017-10-27 DIAGNOSIS — I350 Nonrheumatic aortic (valve) stenosis: Secondary | ICD-10-CM

## 2017-10-27 DIAGNOSIS — E785 Hyperlipidemia, unspecified: Secondary | ICD-10-CM | POA: Diagnosis present

## 2017-10-27 DIAGNOSIS — G4089 Other seizures: Secondary | ICD-10-CM | POA: Diagnosis present

## 2017-10-27 DIAGNOSIS — Z7982 Long term (current) use of aspirin: Secondary | ICD-10-CM | POA: Diagnosis not present

## 2017-10-27 DIAGNOSIS — F4310 Post-traumatic stress disorder, unspecified: Secondary | ICD-10-CM | POA: Diagnosis present

## 2017-10-27 DIAGNOSIS — I63421 Cerebral infarction due to embolism of right anterior cerebral artery: Secondary | ICD-10-CM

## 2017-10-27 DIAGNOSIS — Z7401 Bed confinement status: Secondary | ICD-10-CM | POA: Diagnosis not present

## 2017-10-27 DIAGNOSIS — R569 Unspecified convulsions: Secondary | ICD-10-CM | POA: Diagnosis not present

## 2017-10-27 DIAGNOSIS — R29810 Facial weakness: Secondary | ICD-10-CM | POA: Diagnosis present

## 2017-10-27 DIAGNOSIS — Z8673 Personal history of transient ischemic attack (TIA), and cerebral infarction without residual deficits: Secondary | ICD-10-CM | POA: Diagnosis not present

## 2017-10-27 LAB — BASIC METABOLIC PANEL
ANION GAP: 10 (ref 5–15)
BUN: 8 mg/dL (ref 6–20)
CHLORIDE: 107 mmol/L (ref 101–111)
CO2: 23 mmol/L (ref 22–32)
Calcium: 8.5 mg/dL — ABNORMAL LOW (ref 8.9–10.3)
Creatinine, Ser: 0.88 mg/dL (ref 0.61–1.24)
GFR calc Af Amer: >60 mL/min (ref >60)
GLUCOSE: 91 mg/dL (ref 65–99)
POTASSIUM: 3.7 mmol/L (ref 3.5–5.1)
Sodium: 140 mmol/L (ref 135–145)

## 2017-10-27 LAB — CBC
HEMATOCRIT: 37.1 % — AB (ref 39.0–52.0)
HEMOGLOBIN: 11.7 g/dL — AB (ref 13.0–17.0)
MCH: 27.4 pg (ref 26.0–34.0)
MCHC: 31.5 g/dL (ref 30.0–36.0)
MCV: 86.9 fL (ref 78.0–100.0)
PLATELETS: 175 10*3/uL (ref 150–400)
RBC: 4.27 MIL/uL (ref 4.22–5.81)
RDW: 15.9 % — ABNORMAL HIGH (ref 11.5–15.5)
WBC: 6.1 10*3/uL (ref 4.0–10.5)

## 2017-10-27 MED ORDER — SODIUM CHLORIDE 0.9% FLUSH: 3.0000 mL | INTRAVENOUS | Status: DC | PRN

## 2017-10-27 MED ORDER — PANTOPRAZOLE SODIUM 40 MG PO TBEC
40.0000 mg | DELAYED_RELEASE_TABLET | Freq: Every day | ORAL | Status: DC
  Administered 2017-10-27 – 2017-10-29 (×3): 40 mg via ORAL
  Filled 2017-10-27 (×3): qty 1

## 2017-10-27 MED ORDER — SODIUM CHLORIDE 0.9% FLUSH
3.0000 mL | Freq: Two times a day (BID) | INTRAVENOUS | Status: DC
  Administered 2017-10-27 – 2017-10-29 (×4): 3 mL via INTRAVENOUS
  Administered 2017-10-30: 10 mL via INTRAVENOUS

## 2017-10-27 MED ORDER — SODIUM CHLORIDE 0.9 % IV SOLN
INTRAVENOUS | Status: AC
  Administered 2017-10-27: 21:00:00 via INTRAVENOUS

## 2017-10-27 MED ORDER — ASPIRIN 81 MG PO CHEW: 81.0000 mg | CHEWABLE_TABLET | ORAL | Status: AC

## 2017-10-27 MED ORDER — LEVETIRACETAM 500 MG PO TABS
500.0000 mg | ORAL_TABLET | Freq: Two times a day (BID) | ORAL | Status: DC
  Administered 2017-10-27 – 2017-10-30 (×6): 500 mg via ORAL
  Filled 2017-10-27 (×6): qty 1

## 2017-10-27 MED ORDER — SODIUM CHLORIDE 0.9 % IV SOLN: 250.0000 mL | INTRAVENOUS | Status: DC | PRN

## 2017-10-27 NOTE — Progress Notes (Addendum)
Neurology Progress Note   S:// Doing well this morning. No further seizure activity.   O:// Current vital signs: Vitals:   10/27/17 0430 10/27/17 0849  BP: (!) 144/57 123/77  Pulse: 64 65  Resp: 18 17  Temp: 98 F (36.7 C) 98 F (36.7 C)  SpO2: 99% 100%   Vital signs in last 24 hours: Temp:  [97.5 F (36.4 C)-98.5 F (36.9 C)] 98 F (36.7 C) (03/19 0849) Pulse Rate:  [60-78] 65 (03/19 0849) Resp:  [17-18] 17 (03/19 0849) BP: (123-164)/(50-82) 123/77 (03/19 0849) SpO2:  [97 %-100 %] 100 % (03/19 0849)  GENERAL: Awake, alert in NAD HEENT: - Normocephalic and atraumatic, dry mm, no LN++, no Thyromegally LUNGS - Breathing comfortable on RA CVS: irregular HR, +murmur ABDOMEN - Soft, nontender, nondistended Ext: warm, well perfused, intact peripheral pulses, no edema   NEURO:  Mental Status: AA&Ox3  Language: Speech is mildly slurred.  Naming, repetition, fluency, and comprehension intact. Cranial Nerves: EOMI, visual fields full, left facial droop, facial sensation intact, hearing decreased bilaterally Motor: Upper extremity 5/5 bilaterally, left lower extremity with drift - leg falls by the end of 5 second period. Right lower extremity 5/5.  Tone: is normal and bulk is normal Sensation- Intact to light touch bilaterally Gait- deferred  Medications Scheduled Meds: . aspirin  325 mg Oral Daily  . docusate sodium  100 mg Oral BID  . enoxaparin (LOVENOX) injection  40 mg Subcutaneous Q24H  . pantoprazole (PROTONIX) IV  40 mg Intravenous Q24H   Continuous Infusions: . sodium chloride 75 mL/hr (10/27/17 0330)  . levETIRAcetam Stopped (10/26/17 2225)   PRN Meds:.acetaminophen **OR** acetaminophen, LORazepam, ondansetron **OR** ondansetron (ZOFRAN) IV, traZODone  Labs CBC    Component Value Date/Time   WBC 6.1 10/27/2017 0626   RBC 4.27 10/27/2017 0626   HGB 11.7 (L) 10/27/2017 0626   HCT 37.1 (L) 10/27/2017 0626   PLT 175 10/27/2017 0626   MCV 86.9 10/27/2017  0626   MCH 27.4 10/27/2017 0626   MCHC 31.5 10/27/2017 0626   RDW 15.9 (H) 10/27/2017 0626   LYMPHSABS 1.8 10/26/2017 0624   MONOABS 0.5 10/26/2017 0624   EOSABS 0.1 10/26/2017 0624   BASOSABS 0.0 10/26/2017 0624    CMP Latest Ref Rng & Units 10/27/2017 10/26/2017 10/26/2017  Glucose 65 - 99 mg/dL 91 95 98  BUN 6 - 20 mg/dL 8 10 9   Creatinine 0.61 - 1.24 mg/dL 1.61 0.96 0.45  Sodium 135 - 145 mmol/L 140 141 139  Potassium 3.5 - 5.1 mmol/L 3.7 3.8 3.8  Chloride 101 - 111 mmol/L 107 107 107  CO2 22 - 32 mmol/L 23 - 22  Calcium 8.9 - 10.3 mg/dL 4.0(J) - 8.8(L)  Total Protein 6.5 - 8.1 g/dL - - 6.4(L)  Total Bilirubin 0.3 - 1.2 mg/dL - - 0.7  Alkaline Phos 38 - 126 U/L - - 88  AST 15 - 41 U/L - - 23  ALT 17 - 63 U/L - - 12(L)     Imaging I have reviewed the images obtained CT-scan of the brain:  IMPRESSION: Evidence of patient's known subacute infarct over the right posterior parietal region without significant change. No new areas of infarction and no acute hemorrhage. Mild chronic ischemic microvascular disease. MRI examination of the brain:  IMPRESSION: Typical evolutionary change for subacute RIGHT parietal infarction, without new areas of cerebral infarction. RIGHT subdural hygroma has essentially resolved, but there is residual subacute RIGHT 3 mm thick subdural hematoma, also present previously,  and unchanged. This is a nonsurgical lesion. Atrophy and small vessel disease, not unexpected for age.  Assessment: 84 yo presenting with continuous left sided muscle twitching following a right parietal infarction two weeks ago. MRI shows evolving stroke with no new areas of stroke or bleed.  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   Recommendations: -- Continue Keppra 500 mg BID -- Follow up with vascular for carotid revascularization 1 month after TAVR-appreciate vascular surgery  input. -- TAVR as planned per cardiology  -- Seizure precautions -- Will sign off, please call with any questions  Samuel Vega, M.D. - PGY2 10/27/2017, 10:23 AM   Attending Neurohospitalist Addendum Patient seen and examined with APP/Resident. Agree with the history and physical as documented above. Agree with the plan as documented, which I helped formulate. I have independently reviewed the chart, obtained history, review of systems and examined the patient.I have personally reviewed pertinent head/neck/spine imaging (CT/MRI).  Noncontrast CT of the head and the MRI do not show any acute changes. Recommendations as above. I spoke with the patient's son over the phone in detail and relayed the plan from a neurological standpoint.   Please feel free to call with any questions. --- Milon DikesAshish Penn Grissett, MD [facility] Pager: [phone]  If 7pm to 7am, please call on call as listed on AMION.

## 2017-10-27 NOTE — Progress Notes (Signed)
   Daily Progress Note  Per my prior conversation with Cardiology, they are considering TAVR.  Subsequently, carotid endarterectomy is not anticipated until cardiac intervention complete.  - pt can follow up with me in the office in one month ([phone]   Leonides SakeBrian Anetria Harwick, MD, FACS [facility] [place] Office: [phone] Pager: [phone]  10/27/2017, 7:30 AM

## 2017-10-27 NOTE — Progress Notes (Signed)
Patient ID: Samuel Vega, male   DOB: 11-Sep-1923, 84 y.o.   MRN: [id number]  PROGRESS NOTE    Samuel Vega  WUJ:[id number] DOB: 1923-08-21 DOA: 10/26/2017 PCP: Porfirio Oar, PA-C   Brief Narrative:  84 year old male with history of severe aortic stenosis, PTSD, recent admission for CVA and severe right ICA stenosis presented with apparent seizure activity with abdominal muscle fasciculation.  Neurology was consulted.  He was started on Keppra   Assessment & Plan:   Principal Problem:   Seizure Samuel Vega) Active Problems:   Aortic stenosis, severe   Carotid stenosis, right   Cerebrovascular accident (CVA) due to embolism of right anterior cerebral artery (HCC)    Probable seizure presenting with left-sided muscle twitching following her recent right parietal CVA -Patient with recent CVA, so concern for recurrence - but CT and MRI both indicate improvement and natural stroke progression -Neurology following.  They recommend Keppra 500 mg twice daily. -Seizure precautions -Outpatient follow-up with neurology -Ativan prn  Severe AS -He is high risk for now for TAVR given his recent stroke -Will need outpatient cardiology f/u  Recent CVA  -Continue aspirin.  Outpatient follow-up with neurology -We will follow-up with social worker regarding rehab placement  R carotid stenosis -Vascular requests full cardiac work-up prior to consideration of CEA -Vascular surgeon is planning to re-evaluate in 1 month.  Outpatient follow-up  DVT prophylaxis:  Lovenox  Code Status: Full Family Communication:  None at bedside Disposition Plan:   Rehab facility once available    Consultants: Neurology  Procedures:  EEG on 10/26/2017 Impression: This awake and asleep EEG is abnormal due to the presence of: 1. Slowing of the posterior dominant rhythm 2. Additional focal slowing over the right posterior quadrant  Clinical Correlation of the above findings indicates diffuse  cerebral dysfunction that is non-specific in etiology and can be seen with hypoxic/ischemic injury, toxic/metabolic encephalopathies, neurodegenerative disorders, or medication effect.  Focal slowing over the right posterior quadrant indicates focal cerebral dysfunction suggestive of underlying structural or physiologic abnormality. The absence of epileptiform discharges does not exclude a clinical diagnosis of epilepsy. Clinical correlation is advised.     Antimicrobials: None   Subjective: Patient seen and examined at bedside.  Currently sleepy, does not answer much questions.  No further seizure-like activity reported by the nursing staff.  No overnight fever or vomiting.  Objective: Vitals:   10/26/17 2245 10/26/17 2346 10/27/17 0430 10/27/17 0849  BP: (!) 143/82 (!) 150/68 (!) 144/57 123/77  Pulse: 60 68 64 65  Resp: 18 18 18 17   Temp: (!) 97.5 F (36.4 C) 97.8 F (36.6 C) 98 F (36.7 C) 98 F (36.7 C)  TempSrc: Axillary Oral Oral Oral  SpO2: 100% 99% 99% 100%    Intake/Output Summary (Last 24 hours) at 10/27/2017 1229 Last data filed at 10/26/2017 1600 Gross per 24 hour  Intake 495 ml  Output 300 ml  Net 195 ml   There were no vitals filed for this visit.  Examination:  General exam: Appears calm and comfortable.  Elderly male lying in bed, sleepy. Respiratory system: Bilateral decreased breath sound at bases Cardiovascular system: S1 & S2 heard, rate controlled  gastrointestinal system: Abdomen is nondistended, soft and nontender. Normal bowel sounds heard. Extremities: No cyanosis, clubbing, edema    Data Reviewed: I have personally reviewed following labs and imaging studies  CBC: Recent Labs  Lab 10/26/17 0624 10/26/17 0630 10/27/17 0626  WBC 6.7  --  6.1  NEUTROABS 4.3  --   --  HGB 12.5* 13.3 11.7*  HCT 39.3 39.0 37.1*  MCV 86.4  --  86.9  PLT 185  --  175   Basic Metabolic Panel: Recent Labs  Lab 10/26/17 0624 10/26/17 0630 10/27/17 0626    NA 139 141 140  K 3.8 3.8 3.7  CL 107 107 107  CO2 22  --  23  GLUCOSE 98 95 91  BUN 9 10 8   CREATININE 0.96 0.90 0.88  CALCIUM 8.8*  --  8.5*  MG 1.9  --   --    GFR: Estimated Creatinine Clearance: 52.9 mL/min (by C-G formula based on SCr of 0.88 mg/dL). Liver Function Tests: Recent Labs  Lab 10/26/17 0624  AST 23  ALT 12*  ALKPHOS 88  BILITOT 0.7  PROT 6.4*  ALBUMIN 3.3*   No results for input(s): LIPASE, AMYLASE in the last 168 hours. No results for input(s): AMMONIA in the last 168 hours. Coagulation Profile: Recent Labs  Lab 10/26/17 0624  INR 1.04   Cardiac Enzymes: Recent Labs  Lab 10/26/17 0624  CKTOTAL 72   BNP (last 3 results) No results for input(s): PROBNP in the last 8760 hours. HbA1C: No results for input(s): HGBA1C in the last 72 hours. CBG: No results for input(s): GLUCAP in the last 168 hours. Lipid Profile: No results for input(s): CHOL, HDL, LDLCALC, TRIG, CHOLHDL, LDLDIRECT in the last 72 hours. Thyroid Function Tests: No results for input(s): TSH, T4TOTAL, FREET4, T3FREE, THYROIDAB in the last 72 hours. Anemia Panel: No results for input(s): VITAMINB12, FOLATE, FERRITIN, TIBC, IRON, RETICCTPCT in the last 72 hours. Sepsis Labs: No results for input(s): PROCALCITON, LATICACIDVEN in the last 168 hours.  No results found for this or any previous visit (from the past 240 hour(s)).       Radiology Studies: Ct Head Wo Contrast  Result Date: 10/26/2017 CLINICAL DATA:  Stroke 10/08/2016. Left-sided tremors today. History of dementia. EXAM: CT HEAD WITHOUT CONTRAST TECHNIQUE: Contiguous axial images were obtained from the base of the skull through the vertex without intravenous contrast. COMPARISON:  10/09/2017 an MRI 10/09/2017 FINDINGS: Brain: Ventricles, cisterns and other CSF spaces within normal. There is evidence of patient's known subacute posterior right parietal infarct without significant change in size or distribution compared to  recent MRI. Evidence of minimal chronic ischemic microvascular disease. No mass, significant mass effect or midline shift. No evidence of acute hemorrhage. Vascular: No hyperdense vessel or unexpected calcification. Skull: Normal. Negative for fracture or focal lesion. Sinuses/Orbits: No acute finding. Other: None. IMPRESSION: Evidence of patient's known subacute infarct over the right posterior parietal region without significant change. No new areas of infarction and no acute hemorrhage. Mild chronic ischemic microvascular disease. Electronically Signed   By: Elberta Fortis M.D.   On: 10/26/2017 07:22   Mr Brain Wo Contrast  Result Date: 10/26/2017 CLINICAL DATA:  Continued surveillance, recent stroke.Abdominal spasms. EXAM: MRI HEAD WITHOUT CONTRAST TECHNIQUE: Multiplanar, multiecho pulse sequences of the brain and surrounding structures were obtained without intravenous contrast. COMPARISON:  MRI brain 10/09/2017.  CT head 10/26/2017. FINDINGS: Brain: Subacute infarction redemonstrated in the RIGHT parietal lobe, central encephalomalacia, T2 and FLAIR hyperintensity, normalizing ADC with only mild restricted diffusion. Developing encephalomalacia centrally. No new areas of infarction. There is a small subacute subdural hematoma, RIGHT occipital subdural space, maximum 3 mm thickness, reference image 16 series 7, which in retrospect was present on the previous exam. There is no mass effect on the underlying brain. No midline shift. There was also a RIGHT  subdural hygroma, which is essentially normalized on today's exam. Atrophy and small vessel disease are stable. Vascular: Flow voids are maintained. Skull and upper cervical spine: Normal marrow signal. Sinuses/Orbits: Negative. Other: None. IMPRESSION: Typical evolutionary change for subacute RIGHT parietal infarction, without new areas of cerebral infarction. RIGHT subdural hygroma has essentially resolved, but there is residual subacute RIGHT 3 mm thick  subdural hematoma, also present previously, and unchanged. This is a nonsurgical lesion. Atrophy and small vessel disease, not unexpected for age. Electronically Signed   By: Elsie StainJohn T Curnes M.D.   On: 10/26/2017 09:31        Scheduled Meds: . aspirin  325 mg Oral Daily  . docusate sodium  100 mg Oral BID  . enoxaparin (LOVENOX) injection  40 mg Subcutaneous Q24H  . pantoprazole (PROTONIX) IV  40 mg Intravenous Q24H   Continuous Infusions: . sodium chloride 75 mL/hr (10/27/17 0330)  . levETIRAcetam Stopped (10/26/17 2225)     LOS: 0 days        Glade LloydKshitiz Nollie Terlizzi, MD [facility] Pager [phone]  If 7PM-7AM, please contact night-coverage [email or web address] Password [email or web address] 10/27/2017, 12:29 PM

## 2017-10-27 NOTE — [place] FL2 (Signed)
[place] MEDICAID FL2 LEVEL OF CARE SCREENING TOOL     IDENTIFICATION  Patient Name: Samuel NorwayWilliam T Dore Birthdate: 08/04/1924 Sex: male Admission Date (Current Location): 10/26/2017  [facility] [place]   Facility and Address:  [facility], [address], [place], [place] [address]      Provider Number: [id number]  Attending Physician Name and Address:  Glade LloydAlekh, Kshitiz, MD  Relative Name and Phone Number:       Current Level of Care: Hospital Recommended Level of Care: Skilled Nursing Facility Prior Approval Number:    Date Approved/Denied:   PASRR Number: [id number]  Discharge Plan: SNF    Current Diagnoses: Patient Active Problem List   Diagnosis Date Noted  . Seizure (HCC) 10/26/2017  . Dementia without behavioral disturbance   . Leukocytosis   . Acute blood loss anemia   . Acute CVA (cerebrovascular accident) (HCC) 10/09/2017  . Elevated troponin 10/09/2017  . Rhabdomyolysis 10/09/2017  . Fall   . Syncope   . Carotid stenosis, right   . Cerebrovascular accident (CVA) due to embolism of right anterior cerebral artery (HCC)   . Loss of weight 03/20/2014  . Chronic back pain   . Hyperlipidemia 07/29/2012  . Hearing loss 07/29/2012  . Macular degeneration   . Tinnitus   . ED (erectile dysfunction)   . PTSD (post-traumatic stress disorder)   . Aortic stenosis, severe 09/11/2009    Orientation RESPIRATION BLADDER Height & Weight     Self, Time, Situation, Place  Normal Continent, Incontinent(incontinent at times) Weight:   Height:     BEHAVIORAL SYMPTOMS/MOOD NEUROLOGICAL BOWEL NUTRITION STATUS      Continent Diet(regular)  AMBULATORY STATUS COMMUNICATION OF NEEDS Skin   Limited Assist Verbally Normal                       Personal Care Assistance Level of Assistance  Bathing, Feeding, Dressing Bathing Assistance: Limited assistance Feeding assistance: Independent Dressing Assistance: Limited  assistance     Functional Limitations Info  Sight, Hearing, Speech Sight Info: Adequate Hearing Info: Adequate Speech Info: Adequate    SPECIAL CARE FACTORS FREQUENCY  OT (By licensed OT), PT (By licensed PT)     PT Frequency: 5x/wk OT Frequency: 5x/wk            Contractures Contractures Info: Not present    Additional Factors Info  Code Status, Allergies Code Status Info: Full Allergies Info: Statins           Current Medications (10/27/2017):  This is the current hospital active medication list Current Facility-Administered Medications  Medication Dose Route Frequency Provider Last Rate Last Dose  . 0.9 %  sodium chloride infusion  75 mL/hr Intravenous Continuous Jonah BlueYates, Jennifer, MD 75 mL/hr at 10/27/17 0330 75 mL/hr at 10/27/17 0330  . acetaminophen (TYLENOL) tablet 650 mg  650 mg Oral Q4H PRN Jonah BlueYates, Jennifer, MD       Or  . acetaminophen (TYLENOL) suppository 650 mg  650 mg Rectal Q4H PRN Jonah BlueYates, Jennifer, MD      . aspirin tablet 325 mg  325 mg Oral Daily Jonah BlueYates, Jennifer, MD   325 mg at 10/26/17 1500  . docusate sodium (COLACE) capsule 100 mg  100 mg Oral BID Jonah BlueYates, Jennifer, MD   100 mg at 10/26/17 2125  . enoxaparin (LOVENOX) injection 40 mg  40 mg Subcutaneous Q24H Jonah BlueYates, Jennifer, MD   40 mg at 10/26/17 1311  . levETIRAcetam (KEPPRA)  IVPB 500 mg/100 mL premix  500 mg Intravenous Q12H Jonah Blue, MD   Stopped at 10/26/17 2225  . LORazepam (ATIVAN) injection 1 mg  1 mg Intravenous Q4H PRN Jonah Blue, MD   1 mg at 10/26/17 2250  . ondansetron (ZOFRAN) tablet 4 mg  4 mg Oral Q6H PRN Jonah Blue, MD       Or  . ondansetron [facility]) injection 4 mg  4 mg Intravenous Q6H PRN Jonah Blue, MD      . pantoprazole (PROTONIX) injection 40 mg  40 mg Intravenous Q24H Jonah Blue, MD   40 mg at 10/26/17 2125  . traZODone (DESYREL) tablet 50 mg  50 mg Oral QHS PRN Jonah Blue, MD   50 mg at 10/26/17 2125     Discharge Medications: Please see  discharge summary for a list of discharge medications.  Relevant Imaging Results:  Relevant Lab Results:   Additional Information SS#: [id number]  Baldemar Lenis, LCSW

## 2017-10-27 NOTE — Evaluation (Signed)
Occupational Therapy Evaluation Patient Details Name: Samuel Vega MRN: [id number] DOB: February 26, 1924 Today's Date: 10/27/2017    History of Present Illness pt was admitted from [facility], where he was recently transferred to following a R parietal CVA.  He was admitted for seizure activity.  PMH:  severe aortic stenosis, PTSD, macular degeneration. chronic back pain   Clinical Impression   This 84 year old man was admitted for the above. He was recently at [facility] following CVA and had assistance for adls and transfers.  He will benefit from continued OT to increase independence with adls. Pt is very distractible. He needs min A for SPT; would have +2 for safety with ambulating. He needs up to max A for LB adls. Goals in acute setting are for min A level    Follow Up Recommendations  SNF    Equipment Recommendations  3 in 1 bedside commode    Recommendations for Other Services       Precautions / Restrictions Precautions Precautions: Fall Restrictions Weight Bearing Restrictions: No      Mobility Bed Mobility         Supine to sit: Min assist     General bed mobility comments: HOB raised  Transfers   Equipment used: Rolling walker (2 wheeled)   Sit to Stand: Min assist         General transfer comment: assist to rise and steady    Balance     Sitting balance-Leahy Scale: Good       Standing balance-Leahy Scale: Poor Standing balance comment: reliant on UE support                            ADL either performed or assessed with clinical judgement   ADL   Eating/Feeding: Set up;Sitting   Grooming: Minimal assistance;Sitting   Upper Body Bathing: Minimal assistance;Sitting   Lower Body Bathing: Moderate assistance;Sit to/from stand   Upper Body Dressing : Minimal assistance;Sitting   Lower Body Dressing: Maximal assistance;Sit to/from stand   Toilet Transfer: Minimal assistance;Stand-pivot;RW(chair)              General ADL Comments: Pt tends to lean L; opened silverware but dropped it on his lap.  Pt unable to hold urinal in standing     Vision Baseline Vision/History: Macular Degeneration       Perception     Praxis      Pertinent Vitals/Pain Faces Pain Scale: Hurts little more Pain Location: back Pain Descriptors / Indicators: Sore Pain Intervention(s): Limited activity within patient's tolerance;Monitored during session;Repositioned     Hand Dominance Right   Extremity/Trunk Assessment Upper Extremity Assessment LUE Deficits / Details: R shoulder lags; able to lift approximately 110 degrees.  distal strength 4/5           Communication Communication Communication: HOH   Cognition Arousal/Alertness: Awake/alert Behavior During Therapy: WFL for tasks assessed/performed Overall Cognitive Status: No family/caregiver present to determine baseline cognitive functioning Area of Impairment: Following commands;Safety/judgement;Awareness;Problem solving                   Current Attention Level: Sustained     Safety/Judgement: Decreased awareness of safety   Problem Solving: Requires verbal cues;Requires tactile cues General Comments: pt had difficulty with directionality even with multimodal cues. He was on track with some conversation, but not with others.  (knew AVR was planned; asked for rain bucket to catch rain today).  Very distractible  General Comments       Exercises     Shoulder Instructions      Home Living                                   Additional Comments: was [facility].  Was home alone prior to this      Prior Functioning/Environment Level of Independence: Independent        Comments: macular degeneration        OT Problem List: Decreased strength;Decreased activity tolerance;Impaired balance (sitting and/or standing);Decreased cognition;Decreased safety awareness;Decreased knowledge of use of DME or  AE;Decreased knowledge of precautions      OT Treatment/Interventions: Self-care/ADL training;DME and/or AE instruction;Therapeutic activities;Patient/family education;Balance training    OT Goals(Current goals can be found in the care plan section) Acute Rehab OT Goals Patient Stated Goal: none stated OT Goal Formulation: With patient/family Time For Goal Achievement: 11/10/17 Potential to Achieve Goals: Good ADL Goals Pt Will Perform Grooming: with min guard assist;standing Pt Will Transfer to Toilet: with min guard assist;stand pivot transfer;bedside commode(or urinal standing) Additional ADL Goal #1: pt will complete adl with min guard, sit to stand (except for socks) Additional ADL Goal #2: pt will sustain attention to task for 5 minutes without redirection to task  OT Frequency: Min 2X/week   Barriers to D/C:            Co-evaluation              AM-PAC PT "6 Clicks" Daily Activity     Outcome Measure Help from another person eating meals?: A Little Help from another person taking care of personal grooming?: A Little Help from another person toileting, which includes using toliet, bedpan, or urinal?: A Lot Help from another person bathing (including washing, rinsing, drying)?: A Lot Help from another person to put on and taking off regular upper body clothing?: A Little Help from another person to put on and taking off regular lower body clothing?: A Lot 6 Click Score: 15   End of Session Equipment Utilized During Treatment: Gait belt;Rolling walker Nurse Communication: Mobility status(up in chair with alarm; IV RN present)  Activity Tolerance: Patient tolerated treatment well Patient left: in chair;with call bell/phone within reach;with chair alarm set  OT Visit Diagnosis: Unsteadiness on feet (R26.81);Muscle weakness (generalized) (M62.81)                Time: [id number] OT Time Calculation (min): 26 min Charges:  OT General Charges $OT Visit: 1 Visit OT  Evaluation $OT Eval Moderate Complexity: 1 Mod OT Treatments $Self Care/Home Management : 8-22 mins G-Codes:     GlenwoodMaryellen Jeidy Hoerner, OTR/L [phone] 10/27/2017  Samuel Vega 10/27/2017, 12:01 PM

## 2017-10-27 NOTE — Evaluation (Signed)
Physical Therapy Evaluation Patient Details Name: Samuel Vega MRN: [id number] DOB: 10/26/1923 Today's Date: 10/27/2017   History of Present Illness  [facility], where he was recently transferred to following a Rt parietal CVA.  He was admitted for seizure activity.  PMH:  severe aortic stenosis, PTSD, macular degeneration. chronic back pain  Clinical Impression  Patient presents with residual left sided weakness from prior CVA, cognitive deficits- poor attention, safety awareness, memory,- impaired balance and impaired mobility s/p above. Tolerated gait training with Min-Mod A for balance/safety secondary to left knee buckling and left lateral lean. Pt with tangential speech and impaired attention. [facility] and has been getting rehab there. Daughter in law is not interested in sending him back. Recommend SNF at d/c. Will follow acutely to maximize independence and mobility prior to return home.     Follow Up Recommendations SNF;Supervision for mobility/OOB    Equipment Recommendations  None recommended by PT    Recommendations for Other Services       Precautions / Restrictions Precautions Precautions: Fall Restrictions Weight Bearing Restrictions: No      Mobility  Bed Mobility         Supine to sit: Min assist     General bed mobility comments: Up in chair upon PT arrival.   Transfers Overall transfer level: Needs assistance Equipment used: Rolling walker (2 wheeled) Transfers: Sit to/from Stand Sit to Stand: Min guard         General transfer comment: Min guard to steady in standing. + dizziness.   Ambulation/Gait Ambulation/Gait assistance: Min assist;Mod assist Ambulation Distance (Feet): 80 Feet Assistive device: Rolling walker (2 wheeled) Gait Pattern/deviations: Step-through pattern;Decreased stride length;Decreased weight shift to right Gait velocity: decreased   General Gait Details: Slow,  unsteady gait with left lateral lean and left knee instability. Left knee buckling x2 without any warning despite being told to state when he needed to sit. Constant Min A for balance/support. LLE in ER during stance phase.   Stairs            Wheelchair Mobility    Modified Rankin (Stroke Patients Only) Modified Rankin (Stroke Patients Only) Pre-Morbid Rankin Score: Moderately severe disability(new status since stroke) Modified Rankin: Moderately severe disability     Balance Overall balance assessment: Needs assistance Sitting-balance support: Feet supported;No upper extremity supported Sitting balance-Leahy Scale: Fair     Standing balance support: During functional activity;Bilateral upper extremity supported Standing balance-Leahy Scale: Poor Standing balance comment: reliant on UE support for static standing and external support for dynamic standing due to lateral lean.                              Pertinent Vitals/Pain Pain Assessment: Faces Faces Pain Scale: Hurts little more Pain Location: back Pain Descriptors / Indicators: Sore Pain Intervention(s): Monitored during session;Repositioned;Limited activity within patient's tolerance    Home Living Family/patient expects to be discharged to:: Skilled nursing facility                 Additional Comments: [facility].  Was home alone prior to this    Prior Function Level of Independence: Needs assistance   Gait / Transfers Assistance Needed: Reports walking with RW at rehab PTA.  ADL's / Homemaking Assistance Needed: Needing assist with ADLs at rehab  Comments: macular degeneration     Hand Dominance  Dominant Hand: Right    Extremity/Trunk Assessment   Upper Extremity Assessment Upper Extremity Assessment: Defer to OT evaluation LUE Deficits / Details: R shoulder lags; able to lift approximately 110 degrees.  distal strength 4/5    Lower Extremity Assessment RLE  Sensation: decreased light touch;decreased proprioception LLE Deficits / Details: Grossly ~3/5 throughout.    Cervical / Trunk Assessment Cervical / Trunk Assessment: Kyphotic  Communication   Communication: HOH(likes when you talk slowly, hearing aids)  Cognition Arousal/Alertness: Awake/alert Behavior During Therapy: WFL for tasks assessed/performed Overall Cognitive Status: No family/caregiver present to determine baseline cognitive functioning Area of Impairment: Orientation;Attention;Memory                 Orientation Level: Disoriented to;Situation Current Attention Level: Sustained     Safety/Judgement: Decreased awareness of safety;Decreased awareness of deficits   Problem Solving: Slow processing;Requires verbal cues General Comments: Pt with tangential speech unrelated to questions asked. not at cognitive baseline per daughter in law. Easily distracted. Poor safety awarness. mixing up last admission and this one.       General Comments General comments (skin integrity, edema, etc.): Daughter in law present during session.    Exercises     Assessment/Plan    PT Assessment Patient needs continued PT services  PT Problem List Decreased strength;Decreased mobility;Decreased safety awareness;Decreased activity tolerance;Decreased balance;Decreased knowledge of use of DME;Decreased coordination;Decreased cognition       PT Treatment Interventions DME instruction;Functional mobility training;Balance training;Patient/family education;Gait training;Therapeutic activities;Neuromuscular re-education;Therapeutic exercise;Cognitive remediation    PT Goals (Current goals can be found in the Care Plan section)  Acute Rehab PT Goals Patient Stated Goal: to go home PT Goal Formulation: With patient Time For Goal Achievement: 11/10/17 Potential to Achieve Goals: Good    Frequency Min 3X/week   Barriers to discharge Decreased caregiver support      Co-evaluation                AM-PAC PT "6 Clicks" Daily Activity  Outcome Measure Difficulty turning over in bed (including adjusting bedclothes, sheets and blankets)?: None Difficulty moving from lying on back to sitting on the side of the bed? : Unable Difficulty sitting down on and standing up from a chair with arms (e.g., wheelchair, bedside commode, etc,.)?: A Little Help needed moving to and from a bed to chair (including a wheelchair)?: A Little Help needed walking in hospital room?: A Lot Help needed climbing 3-5 steps with a railing? : A Lot 6 Click Score: 15    End of Session Equipment Utilized During Treatment: Gait belt Activity Tolerance: Patient limited by fatigue Patient left: in chair;with call bell/phone within reach;with chair alarm set;with family/visitor present Nurse Communication: Mobility status PT Visit Diagnosis: Other abnormalities of gait and mobility (R26.89);History of falling (Z91.81);Unsteadiness on feet (R26.81);Hemiplegia and hemiparesis Hemiplegia - Right/Left: Left Hemiplegia - dominant/non-dominant: Non-dominant Hemiplegia - caused by: Cerebral infarction    Time: 1244-1310 PT Time Calculation (min) (ACUTE ONLY): 26 min   Charges:   PT Evaluation $PT Eval Moderate Complexity: 1 Mod PT Treatments $Gait Training: 8-22 mins   PT G Codes:        Mylo Red, PT, DPT [phone]    Samuel Vega 10/27/2017, 1:23 PM

## 2017-10-27 NOTE — Progress Notes (Signed)
OT Cancellation Note  Patient Details Name: Samuel Vega MRN: [id number] DOB: 10/11/1923   Cancelled Treatment:    Reason Eval/Treat Not Completed: Other (comment). Attempted to see pt 2xs:  Eating breakfast and now sleeping soundly. Will check back later.  Samuel Vega 10/27/2017, 10:41 AM  Samuel Vega, OTR/L [phone] 10/27/2017

## 2017-10-28 ENCOUNTER — Other Ambulatory Visit: Payer: Self-pay

## 2017-10-28 ENCOUNTER — Other Ambulatory Visit: Payer: Self-pay | Admitting: *Deleted

## 2017-10-28 ENCOUNTER — Encounter (HOSPITAL_COMMUNITY): Payer: Self-pay | Admitting: General Practice

## 2017-10-28 MED ORDER — LEVETIRACETAM 500 MG PO TABS: 500.0000 mg | ORAL_TABLET | Freq: Two times a day (BID) | ORAL | 0 refills | Status: DC

## 2017-10-28 MED ORDER — DOCUSATE SODIUM 100 MG PO CAPS: 100.0000 mg | ORAL_CAPSULE | Freq: Every day | ORAL | 0 refills | Status: DC | PRN

## 2017-10-28 NOTE — Patient Outreach (Signed)
[facility]) Care Management  10/28/2017  Samuel Vega 10/06/1923 [id number]   CSW received an In [employer] Message from [facility], [facility] Lia[facility] with [facility] Care Management, indicating that patient is being discharged from the hospital today and [facility].  [facility] is a skilled nursing facility where patient will go to receive short-term rehabilitative services.  [facility] is currently being covered by CSW colleague, Samuel Vega; therefore, CSW will transfer patient's case to Ms. Samuel Vega to provide community case management services, as well as assist with discharge planning needs. Samuel Vega, BSW, MSW, LCSW  Licensed Restaurant manager, fast foodClinical Social Worker  [facility] Care Management [facility]  Mailing [address], [place], [place] [address] Physical Address-[address], [place], [place] [address] Toll Free Main # [phone] Fax # [phone] Cell # [phone]  Office # [phone] [email or web address]

## 2017-10-28 NOTE — Consult Note (Addendum)
   [facility]   10/28/2017  Concha NorwayWilliam T Kerstetter 08/20/1923 [id number]    Ms. Milta DeitersSlaughter is active with [facility] Care [facility] program. She is followed by [facility] LCSW. Please see chart review tab then encounters for patient outreach details.   Spoke with inpatient LCSW to make aware [facility] is active. Inpatient LCSW indicates that [facility].   Will update [facility] Community LCSW.   Raiford NobleAtika Lennyn Gange, MSN-Ed, RN,BSN [facility] Care [facility] Hospital Liaison [phone]

## 2017-10-28 NOTE — Progress Notes (Signed)
CSW following for discharge plan. CSW contacted facility options and obtained the following information: [facility] will not have a bed until sometime next week; [facility] will not have a bed until April; and [facility] has declined to offer a bed on the patient.   CSW provided information to patient's son over the phone. Patient's son said he knows someone over at [facility], and will call to advocate for the patient to be accepted. Patient's son also agreed to look over other facility options, as the patient is medically ready to transfer to rehab. CSW emailed list of bed offers to patient's son for review.   CSW contacted [employer] for insurance authorization request. Patient will need insurance authorization prior to admission to SNF. CSW will call insurance back after patient's son makes a decision on facility preference, and will await call back from [facility] on authorization decision.  CSW will continue to follow.  Blenda NicelyElizabeth Tally Mckinnon, [place] Clinical Social Worker [phone]

## 2017-10-28 NOTE — Discharge Summary (Signed)
Physician Discharge Summary  Samuel NorwayWilliam T Vega WJX:[id number] DOB: 05/01/1924 DOA: 10/26/2017  PCP: Porfirio OarJeffery, Chelle, PA-C  Admit date: 10/26/2017 Discharge date: 10/28/2017  Admitted From: SNF Disposition: SNF  Recommendations for Outpatient Follow-up:  1. Follow up with SNF provider at earliest convenience 2. Follow-up with neurology as an outpatient 3. Follow-up with cardiology as scheduled Follow-up with vascular surgery as an outpatient as scheduled   Home Health: No Equipment/Devices: None  Discharge Condition: Guarded CODE STATUS: Full Diet recommendation: Heart Healthy /diet as per SLP recommendations  Brief/Interim Summary: 84 year old male with history of severe aortic stenosis, PTSD, recent admission for CVA and severe right ICA stenosis presented with apparent seizure activity with abdominal muscle fasciculation.  Neurology was consulted.  He was started on Keppra.  Neurology recommended oral Keppra on discharge with outpatient neurology follow-up.  Patient has remained stable and will be discharged to nursing home once bed is available.    Discharge Diagnoses:  Principal Problem:   Seizure [facility](HCC) Active Problems:   Aortic stenosis, severe   Carotid stenosis, right   Cerebrovascular accident (CVA) due to embolism of right anterior cerebral artery (HCC)   Probable seizure presenting with left-sided muscle twitching following  recent right parietal CVA -Symptoms have improved -Patientwith recent CVA, so concern for recurrence - but CT and MRI bothindicate improvement and natural stroke progression -Neurology recommended Keppra 500 mg twice daily. -Seizure precautions -Outpatient follow-up with neurology -Discharge to nursing home once bed is available   Severe AS -He is high risk for now for TAVR given his recent stroke -Will need outpatient cardiology f/u.  Cardiology planning outpatient cath first.  Recent CVA  -Continue aspirin.  Outpatient follow-up  with neurology   R carotid stenosis -Vascular requests full cardiac work-up prior to consideration of CEA -Vascular surgeon is planning to re-evaluate in 1 month.  Outpatient follow-up     Discharge Instructions  Discharge Instructions    Ambulatory referral to Neurology   Complete by:  As directed    An appointment is requested in approximately: Few weeks for follow-up of recent seizure and stroke   Call MD for:  difficulty breathing, headache or visual disturbances   Complete by:  As directed    Call MD for:  extreme fatigue   Complete by:  As directed    Call MD for:  hives   Complete by:  As directed    Call MD for:  persistant dizziness or light-headedness   Complete by:  As directed    Call MD for:  persistant nausea and vomiting   Complete by:  As directed    Call MD for:  severe uncontrolled pain   Complete by:  As directed    Call MD for:  temperature >100.4   Complete by:  As directed    Diet - low sodium heart healthy   Complete by:  As directed    Discharge instructions   Complete by:  As directed    Fall precautions Diet as per SLP recommendations   Increase activity slowly   Complete by:  As directed      Allergies as of 10/28/2017      Reactions   Statins Other (See Comments)   MYALGIAS      Medication List    TAKE these medications   acetaminophen 500 MG tablet Commonly known as:  TYLENOL Take 1,000 mg by mouth every 6 (six) hours as needed for mild pain.   aspirin 325 MG tablet Take 1 tablet (325  mg total) by mouth daily.   docusate sodium 100 MG capsule Commonly known as:  COLACE Take 1 capsule (100 mg total) by mouth daily as needed for mild constipation.   levETIRAcetam 500 MG tablet Commonly known as:  KEPPRA Take 1 tablet (500 mg total) by mouth 2 (two) times daily.   traZODone 50 MG tablet Commonly known as:  DESYREL Take 50 mg by mouth at bedtime as needed for sleep.       Allergies  Allergen Reactions  . Statins Other  (See Comments)    MYALGIAS    Consultations:  Neurology   Procedures/Studies: Ct Angio Head W Or Wo Contrast  Result Date: 10/09/2017 CLINICAL DATA:  Stroke workup.  Patient found on floor. EXAM: CT ANGIOGRAPHY HEAD AND NECK TECHNIQUE: Multidetector CT imaging of the head and neck was performed using the standard protocol during bolus administration of intravenous contrast. Multiplanar CT image reconstructions and MIPs were obtained to evaluate the vascular anatomy. Carotid stenosis measurements (when applicable) are obtained utilizing NASCET criteria, using the distal internal carotid diameter as the denominator. CONTRAST:  50mL ISOVUE-370 IOPAMIDOL (ISOVUE-370) INJECTION 76% COMPARISON:  Brain MRI from earlier today. FINDINGS: CT HEAD FINDINGS Brain: Right parietal low-density as characterized by MRI earlier today. No evidence of progression. No hemorrhage or hydrocephalus. Brain volume is excellent for age. There is a small right frontal hygroma without significant mass effect. Vascular: See below Skull: Questionable scalp swelling along the left parietal bone posteriorly. Negative for fracture. Sinuses: No acute finding.  Anterior nasal septum perforation. Orbits: Bilateral cataract resection. Review of the MIP images confirms the above findings CTA NECK FINDINGS Aortic arch: Mild atheromatous plaque.  Three vessel branching. Right carotid system: Advanced calcified and noncalcified plaque at the common carotid bifurcation and ICA bulb with 70-80% stenosis. No dissection or beading. Left carotid system: Moderate calcified and noncalcified plaque at the common carotid bifurcation without ulceration or flow limiting stenosis. Vertebral arteries: Proximal subclavian mild atherosclerosis. Right dominant vertebral artery. There is mild narrowing at the left vertebral origin. No beading or dissection. Skeleton: Diffuse cervical disc degeneration with narrowing reversal of lordosis. There is upper cervical  facet arthropathy with C2-3 anterolisthesis. Other neck:  No acute or aggressive finding. Upper chest: Negative Review of the MIP images confirms the above findings CTA HEAD FINDINGS Anterior circulation: Atherosclerotic plaque on the carotid siphons without flow limiting stenosis. The left ICA is smaller than the right in the setting of hypoplastic left A1 segment. No visible branch occlusion or flow limiting stenosis. Negative for aneurysm. On MIP images there is prominent vessels about the right parietal convexity. Posterior circulation: Vertebrobasilar arteries are smooth and diffusely patent. Moderate atheromatous narrowing of the distal left P2 segment. Venous sinuses: Patent Anatomic variants: None significant Delayed phase: Mild cortical enhancement at the level of right parietal abnormality which is nonspecific but would be supportive of a subacute infarct. Review of the MIP images confirms the above findings IMPRESSION: 1. 70-80% atheromatous narrowing at the right ICA bulb. 2. No other significant stenosis in the neck. 3. Intracranial atherosclerosis without proximal or correctable stenosis. There is moderate atheromatous narrowing of the distal left P2 segment. 4. Presumed infarct in the right parietal lobe (see brain MRI report). Mild associated enhancement would correlate with subacute timing. Electronically Signed   By: Marnee Spring M.D.   On: 10/09/2017 10:59   Dg Chest 2 View  Result Date: 10/08/2017 CLINICAL DATA:  Fall EXAM: CHEST  2 VIEW COMPARISON:  None. FINDINGS:  Mild cardiomegaly. Bibasilar atelectasis. No effusions. No edema. No acute bony abnormality. IMPRESSION: Mild cardiomegaly.  Bibasilar atelectasis. Electronically Signed   By: Charlett Nose M.D.   On: 10/08/2017 23:17   Dg Pelvis 1-2 Views  Result Date: 10/09/2017 CLINICAL DATA:  84 year old male with fall and left hip pain. EXAM: PELVIS - 1-2 VIEW COMPARISON:  None. FINDINGS: There is no acute fracture or dislocation. The  bones are osteopenic. Mild arthritic changes. There is degenerative changes of the visualized lower lumbar spine. The soft tissues appear unremarkable. Vascular calcification noted. IMPRESSION: No acute fracture or dislocation. Electronically Signed   By: Elgie Collard M.D.   On: 10/09/2017 05:33   Dg Elbow 2 Views Left  Result Date: 10/09/2017 CLINICAL DATA:  84 year old male with fall and left elbow pain. EXAM: LEFT ELBOW - 2 VIEW COMPARISON:  None. FINDINGS: There is no acute fracture or dislocation. Mild osteopenia. No arthritic changes. The soft tissues appear unremarkable. No joint effusion. IMPRESSION: Negative. Electronically Signed   By: Elgie Collard M.D.   On: 10/09/2017 05:34   Ct Head Wo Contrast  Result Date: 10/26/2017 CLINICAL DATA:  Stroke 10/08/2016. Left-sided tremors today. History of dementia. EXAM: CT HEAD WITHOUT CONTRAST TECHNIQUE: Contiguous axial images were obtained from the base of the skull through the vertex without intravenous contrast. COMPARISON:  10/09/2017 an MRI 10/09/2017 FINDINGS: Brain: Ventricles, cisterns and other CSF spaces within normal. There is evidence of patient's known subacute posterior right parietal infarct without significant change in size or distribution compared to recent MRI. Evidence of minimal chronic ischemic microvascular disease. No mass, significant mass effect or midline shift. No evidence of acute hemorrhage. Vascular: No hyperdense vessel or unexpected calcification. Skull: Normal. Negative for fracture or focal lesion. Sinuses/Orbits: No acute finding. Other: None. IMPRESSION: Evidence of patient's known subacute infarct over the right posterior parietal region without significant change. No new areas of infarction and no acute hemorrhage. Mild chronic ischemic microvascular disease. Electronically Signed   By: Elberta Fortis M.D.   On: 10/26/2017 07:22   Ct Head Wo Contrast  Result Date: 10/08/2017 CLINICAL DATA:  Found on floor.   Bruising to left side of face EXAM: CT HEAD WITHOUT CONTRAST CT CERVICAL SPINE WITHOUT CONTRAST TECHNIQUE: Multidetector CT imaging of the head and cervical spine was performed following the standard protocol without intravenous contrast. Multiplanar CT image reconstructions of the cervical spine were also generated. COMPARISON:  None. FINDINGS: CT HEAD FINDINGS Brain: Area of low-density noted in the posteromedial right parietal lobe concerning for acute infarct. There is atrophy and chronic small vessel disease changes. No hemorrhage or hydrocephalus. Vascular: No hyperdense vessel or unexpected calcification. Skull: No acute calvarial abnormality. Sinuses/Orbits: No acute findings Other: None CT CERVICAL SPINE FINDINGS Alignment: No subluxation. Skull base and vertebrae: No fracture Soft tissues and spinal canal: No prevertebral fluid or swelling. No visible canal hematoma. Disc levels:  Diffuse degenerative disc and facet disease. Upper chest: No acute findings Other: No acute findings IMPRESSION: Subtle low-density in the posteromedial high right parietal lobe concerning for acute infarction. No hemorrhage. Diffuse degenerative disc and facet disease in the cervical spine. No acute bony abnormality. Electronically Signed   By: Charlett Nose M.D.   On: 10/08/2017 23:33   Ct Angio Neck W Or Wo Contrast  Result Date: 10/09/2017 CLINICAL DATA:  Stroke workup.  Patient found on floor. EXAM: CT ANGIOGRAPHY HEAD AND NECK TECHNIQUE: Multidetector CT imaging of the head and neck was performed using the standard protocol  during bolus administration of intravenous contrast. Multiplanar CT image reconstructions and MIPs were obtained to evaluate the vascular anatomy. Carotid stenosis measurements (when applicable) are obtained utilizing NASCET criteria, using the distal internal carotid diameter as the denominator. CONTRAST:  50mL ISOVUE-370 IOPAMIDOL (ISOVUE-370) INJECTION 76% COMPARISON:  Brain MRI from earlier today.  FINDINGS: CT HEAD FINDINGS Brain: Right parietal low-density as characterized by MRI earlier today. No evidence of progression. No hemorrhage or hydrocephalus. Brain volume is excellent for age. There is a small right frontal hygroma without significant mass effect. Vascular: See below Skull: Questionable scalp swelling along the left parietal bone posteriorly. Negative for fracture. Sinuses: No acute finding.  Anterior nasal septum perforation. Orbits: Bilateral cataract resection. Review of the MIP images confirms the above findings CTA NECK FINDINGS Aortic arch: Mild atheromatous plaque.  Three vessel branching. Right carotid system: Advanced calcified and noncalcified plaque at the common carotid bifurcation and ICA bulb with 70-80% stenosis. No dissection or beading. Left carotid system: Moderate calcified and noncalcified plaque at the common carotid bifurcation without ulceration or flow limiting stenosis. Vertebral arteries: Proximal subclavian mild atherosclerosis. Right dominant vertebral artery. There is mild narrowing at the left vertebral origin. No beading or dissection. Skeleton: Diffuse cervical disc degeneration with narrowing reversal of lordosis. There is upper cervical facet arthropathy with C2-3 anterolisthesis. Other neck:  No acute or aggressive finding. Upper chest: Negative Review of the MIP images confirms the above findings CTA HEAD FINDINGS Anterior circulation: Atherosclerotic plaque on the carotid siphons without flow limiting stenosis. The left ICA is smaller than the right in the setting of hypoplastic left A1 segment. No visible branch occlusion or flow limiting stenosis. Negative for aneurysm. On MIP images there is prominent vessels about the right parietal convexity. Posterior circulation: Vertebrobasilar arteries are smooth and diffusely patent. Moderate atheromatous narrowing of the distal left P2 segment. Venous sinuses: Patent Anatomic variants: None significant Delayed phase:  Mild cortical enhancement at the level of right parietal abnormality which is nonspecific but would be supportive of a subacute infarct. Review of the MIP images confirms the above findings IMPRESSION: 1. 70-80% atheromatous narrowing at the right ICA bulb. 2. No other significant stenosis in the neck. 3. Intracranial atherosclerosis without proximal or correctable stenosis. There is moderate atheromatous narrowing of the distal left P2 segment. 4. Presumed infarct in the right parietal lobe (see brain MRI report). Mild associated enhancement would correlate with subacute timing. Electronically Signed   By: Marnee Spring M.D.   On: 10/09/2017 10:59   Ct Cervical Spine Wo Contrast  Result Date: 10/08/2017 CLINICAL DATA:  Found on floor.  Bruising to left side of face EXAM: CT HEAD WITHOUT CONTRAST CT CERVICAL SPINE WITHOUT CONTRAST TECHNIQUE: Multidetector CT imaging of the head and cervical spine was performed following the standard protocol without intravenous contrast. Multiplanar CT image reconstructions of the cervical spine were also generated. COMPARISON:  None. FINDINGS: CT HEAD FINDINGS Brain: Area of low-density noted in the posteromedial right parietal lobe concerning for acute infarct. There is atrophy and chronic small vessel disease changes. No hemorrhage or hydrocephalus. Vascular: No hyperdense vessel or unexpected calcification. Skull: No acute calvarial abnormality. Sinuses/Orbits: No acute findings Other: None CT CERVICAL SPINE FINDINGS Alignment: No subluxation. Skull base and vertebrae: No fracture Soft tissues and spinal canal: No prevertebral fluid or swelling. No visible canal hematoma. Disc levels:  Diffuse degenerative disc and facet disease. Upper chest: No acute findings Other: No acute findings IMPRESSION: Subtle low-density in the posteromedial high right  parietal lobe concerning for acute infarction. No hemorrhage. Diffuse degenerative disc and facet disease in the cervical spine.  No acute bony abnormality. Electronically Signed   By: Charlett Nose M.D.   On: 10/08/2017 23:33   Mr Brain Wo Contrast  Result Date: 10/26/2017 CLINICAL DATA:  Continued surveillance, recent stroke.Abdominal spasms. EXAM: MRI HEAD WITHOUT CONTRAST TECHNIQUE: Multiplanar, multiecho pulse sequences of the brain and surrounding structures were obtained without intravenous contrast. COMPARISON:  MRI brain 10/09/2017.  CT head 10/26/2017. FINDINGS: Brain: Subacute infarction redemonstrated in the RIGHT parietal lobe, central encephalomalacia, T2 and FLAIR hyperintensity, normalizing ADC with only mild restricted diffusion. Developing encephalomalacia centrally. No new areas of infarction. There is a small subacute subdural hematoma, RIGHT occipital subdural space, maximum 3 mm thickness, reference image 16 series 7, which in retrospect was present on the previous exam. There is no mass effect on the underlying brain. No midline shift. There was also a RIGHT subdural hygroma, which is essentially normalized on today's exam. Atrophy and small vessel disease are stable. Vascular: Flow voids are maintained. Skull and upper cervical spine: Normal marrow signal. Sinuses/Orbits: Negative. Other: None. IMPRESSION: Typical evolutionary change for subacute RIGHT parietal infarction, without new areas of cerebral infarction. RIGHT subdural hygroma has essentially resolved, but there is residual subacute RIGHT 3 mm thick subdural hematoma, also present previously, and unchanged. This is a nonsurgical lesion. Atrophy and small vessel disease, not unexpected for age. Electronically Signed   By: Elsie Stain M.D.   On: 10/26/2017 09:31   Mr Brain Wo Contrast  Result Date: 10/09/2017 CLINICAL DATA:  84 y/o  M; possible syncope. EXAM: MRI HEAD WITHOUT CONTRAST TECHNIQUE: Multiplanar, multiecho pulse sequences of the brain and surrounding structures were obtained without intravenous contrast. COMPARISON:  10/08/2017 CT head.  FINDINGS: Brain: 3.8 x 3.9 x 4.6 cm (volume = 36 cm^3) region of predominantly cortical diffusion hyperintensity which demonstrates both areas of reduced, intermediate, and increased diffusion on ADC (see series 3, image 38 and series 350, image 38). The region is associated with increased T2 FLAIR hyperintense signal abnormality and minimal gyral swelling. No focus of reduced diffusion to suggest acute or early subacute infarction. No abnormal susceptibility hypointensity to indicate intracranial hemorrhage. Severalnonspecific foci of T2 FLAIR hyperintense signal abnormality in subcortical and periventricular white matter are compatible withmildchronic microvascular ischemic changes for age. Mildbrain parenchymal volume loss. There is displacement of cortical veins away from the calvarium over the right cerebral cortex with CSF signal (series 10, image 15 and series 6, image 16) which may represent a small hygroma. No midline shift. Vascular: Normal flow voids. Skull and upper cervical spine: Normal marrow signal. Sinuses/Orbits: Negative. Other: None. IMPRESSION: 1. Right superior and medial parietal lobe 36 cc area of predominantly cortical diffusion signal abnormality demonstrating areas of both reduced and increased diffusion. Findings probably represents a subacute infarction, but cerebritis or seizure activity should also be considered. No hemorrhage or mass effect. 2. Mild chronic microvascular ischemic changes and mild parenchymal volume loss of the brain. 3. Displacement of cortical veins over the right cerebral convexity from the calvarium, possibly small hygroma. No significant mass effect on the brain. Electronically Signed   By: Mitzi Hansen M.D.   On: 10/09/2017 04:03   Dg Hand Complete Left  Result Date: 10/08/2017 CLINICAL DATA:  Fall.  Right hand pain EXAM: LEFT HAND - COMPLETE 3+ VIEW COMPARISON:  None. FINDINGS: No acute bony abnormality. Specifically, no fracture, subluxation, or  dislocation. IMPRESSION: No acute bony abnormality.  Electronically Signed   By: Charlett Nose M.D.   On: 10/08/2017 23:21   Mr Maxine Glenn Head Wo Contrast  Result Date: 10/09/2017 CLINICAL DATA:  84 y/o  M; evaluation for stroke. EXAM: MRA HEAD WITHOUT CONTRAST TECHNIQUE: Angiographic images of the Circle of Willis were obtained using MRA technique without intravenous contrast. COMPARISON:  10/09/2017 MRI head. FINDINGS: Internal carotid arteries: Patent. Mild lumen irregularity without significant stenosis compatible with atherosclerosis. Anterior cerebral arteries:  Patent. Middle cerebral arteries: Patent. Anterior communicating artery: Patent. Posterior communicating arteries: Patent. Left P2 short segment of moderate stenosis. Posterior cerebral arteries:  Patent. Basilar artery:  Patent. Vertebral arteries:  Patent. No aneurysm or large vessel occlusion identified. Right ACA ACA distributions demonstrate increased flow related signal, probably hyperemia. IMPRESSION: Patent anterior and posterior intracranial circulation. No large vessel occlusion or aneurysm. Increase flow related signal within right ACA and MCA distributions, probably hyperemia given right parietal abnormality. Electronically Signed   By: Mitzi Hansen M.D.   On: 10/09/2017 06:11   Dg Femur Min 2 Views Left  Result Date: 10/09/2017 CLINICAL DATA:  84 year old male with fall and left hip pain. EXAM: LEFT FEMUR 2 VIEWS COMPARISON:  Pelvic radiograph dated 10/09/2017 FINDINGS: There is no acute fracture or dislocation. Mild osteopenia. No significant arthritic changes. Vascular calcifications noted. The soft tissues appear unremarkable. IMPRESSION: Negative. Electronically Signed   By: Elgie Collard M.D.   On: 10/09/2017 05:36    EEG on 10/26/2017 Impression: This awake andasleepEEG is abnormal due to the presence of: 1. Slowing of the posterior dominant rhythm 2. Additional focal slowing over the right posterior  quadrant  Clinical Correlation of the above findings indicates diffuse cerebral dysfunction that is non-specific in etiology and can be seen with hypoxic/ischemic injury, toxic/metabolic encephalopathies, neurodegenerative disorders, or medication effect.Focal slowing over the right posterior quadrant indicatesfocal cerebral dysfunction suggestive of underlying structural or physiologic abnormality. The absence of epileptiform discharges does not exclude a clinical diagnosis of epilepsy. Clinical correlation is advised.    Subjective: Patient seen and examined at bedside.  He is awake and answering some questions.  He feels weak.  No overnight fever, nausea or vomiting.  No current seizure activity. Discharge Exam: Vitals:   10/28/17 0506 10/28/17 0835  BP: (!) 149/53   Pulse: 69 (P) 70  Resp: 19 (P) 18  Temp: (!) 97.5 F (36.4 C) (P) 97.7 F (36.5 C)  SpO2: 100% (P) 99%   Vitals:   10/27/17 1945 10/28/17 0037 10/28/17 0506 10/28/17 0835  BP: (!) 158/49 (!) 144/67 (!) 149/53   Pulse: (!) 58 65 69 (P) 70  Resp: 18 19 19  (P) 18  Temp: 97.7 F (36.5 C) 98.4 F (36.9 C) (!) 97.5 F (36.4 C) (P) 97.7 F (36.5 C)  TempSrc: Oral Axillary Oral (P) Oral  SpO2: 100% 98% 100% (P) 99%    General: Pt is alert, awake, not in acute distress Cardiovascular: Rate controlled, S1/S2 + Respiratory: Bilateral decreased breath sounds at bases  abdominal: Soft, NT, ND, bowel sounds + Extremities: no edema, no cyanosis    The results of significant diagnostics from this hospitalization (including imaging, microbiology, ancillary and laboratory) are listed below for reference.     Microbiology: No results found for this or any previous visit (from the past 240 hour(s)).   Labs: BNP (last 3 results) No results for input(s): BNP in the last 8760 hours. Basic Metabolic Panel: Recent Labs  Lab 10/26/17 0624 10/26/17 0630 10/27/17 0626  NA 139 141  140  K 3.8 3.8 3.7  CL 107 107 107   CO2 22  --  23  GLUCOSE 98 95 91  BUN 9 10 8   CREATININE 0.96 0.90 0.88  CALCIUM 8.8*  --  8.5*  MG 1.9  --   --    Liver Function Tests: Recent Labs  Lab 10/26/17 0624  AST 23  ALT 12*  ALKPHOS 88  BILITOT 0.7  PROT 6.4*  ALBUMIN 3.3*   No results for input(s): LIPASE, AMYLASE in the last 168 hours. No results for input(s): AMMONIA in the last 168 hours. CBC: Recent Labs  Lab 10/26/17 0624 10/26/17 0630 10/27/17 0626  WBC 6.7  --  6.1  NEUTROABS 4.3  --   --   HGB 12.5* 13.3 11.7*  HCT 39.3 39.0 37.1*  MCV 86.4  --  86.9  PLT 185  --  175   Cardiac Enzymes: Recent Labs  Lab 10/26/17 0624  CKTOTAL 72   BNP: Invalid input(s): POCBNP CBG: No results for input(s): GLUCAP in the last 168 hours. D-Dimer No results for input(s): DDIMER in the last 72 hours. Hgb A1c No results for input(s): HGBA1C in the last 72 hours. Lipid Profile No results for input(s): CHOL, HDL, LDLCALC, TRIG, CHOLHDL, LDLDIRECT in the last 72 hours. Thyroid function studies No results for input(s): TSH, T4TOTAL, T3FREE, THYROIDAB in the last 72 hours.  Invalid input(s): FREET3 Anemia work up No results for input(s): VITAMINB12, FOLATE, FERRITIN, TIBC, IRON, RETICCTPCT in the last 72 hours. Urinalysis    Component Value Date/Time   COLORURINE YELLOW 10/26/2017 1852   APPEARANCEUR CLEAR 10/26/2017 1852   LABSPEC 1.013 10/26/2017 1852   PHURINE 6.0 10/26/2017 1852   GLUCOSEU NEGATIVE 10/26/2017 1852   HGBUR MODERATE (A) 10/26/2017 1852   BILIRUBINUR NEGATIVE 10/26/2017 1852   KETONESUR NEGATIVE 10/26/2017 1852   PROTEINUR NEGATIVE 10/26/2017 1852   NITRITE NEGATIVE 10/26/2017 1852   LEUKOCYTESUR NEGATIVE 10/26/2017 1852   Sepsis Labs Invalid input(s): PROCALCITONIN,  WBC,  LACTICIDVEN Microbiology No results found for this or any previous visit (from the past 240 hour(s)).   Time coordinating discharge: 35 minutes  SIGNED:   Glade Lloyd, MD  [facility] 10/28/2017, 9:33 AM Pager: [phone]  If 7PM-7AM, please contact night-coverage [email or web address] Password [email or web address]

## 2017-10-28 NOTE — Progress Notes (Signed)
Physical Therapy Treatment Patient Details Name: Samuel Vega MRN: [id number] DOB: Jun 09, 1924 Today's Date: 10/28/2017    History of Present Illness Pt is a 84 y/o male admitted from [facility], where he was recently transferred to following a Rt parietal CVA.  He was admitted for seizure activity.  PMH:  severe aortic stenosis, PTSD, macular degeneration. chronic back pain    PT Comments    Pt pleasantly confused throughout and with poor safety awareness, limiting mobility this session. He was able to perform bed mobility and transfers with min guard for safety. Pt would continue to benefit from skilled physical therapy services at this time while admitted and after d/c to address the below listed limitations in order to improve overall safety and independence with functional mobility.    Follow Up Recommendations  SNF;Supervision for mobility/OOB     Equipment Recommendations  None recommended by PT    Recommendations for Other Services       Precautions / Restrictions Precautions Precautions: Fall Restrictions Weight Bearing Restrictions: No    Mobility  Bed Mobility Overal bed mobility: Needs Assistance Bed Mobility: Supine to Sit;Sit to Supine     Supine to sit: Min guard Sit to supine: Min guard   General bed mobility comments: increased time, min guard for safety  Transfers Overall transfer level: Needs assistance Equipment used: Rolling walker (2 wheeled) Transfers: Sit to/from [employer] Sit to Stand: Min guard Stand pivot transfers: Min guard       General transfer comment: close min guard and cueing for safety  Ambulation/Gait             General Gait Details: pt fatigued following use of BSC; pt also very confused throughout, limiting safety with mobility   Stairs            Wheelchair Mobility    Modified Rankin (Stroke Patients Only)       Balance Overall balance assessment: Needs  assistance Sitting-balance support: Feet supported;No upper extremity supported Sitting balance-Leahy Scale: Fair     Standing balance support: During functional activity;Bilateral upper extremity supported Standing balance-Leahy Scale: Poor                              Cognition Arousal/Alertness: Awake/alert Behavior During Therapy: WFL for tasks assessed/performed Overall Cognitive Status: No family/caregiver present to determine baseline cognitive functioning Area of Impairment: Attention;Memory;Safety/judgement;Problem solving                   Current Attention Level: Sustained   Following Commands: Follows one step commands with increased time Safety/Judgement: Decreased awareness of safety;Decreased awareness of deficits   Problem Solving: Slow processing;Difficulty sequencing;Requires verbal cues        Exercises      General Comments        Pertinent Vitals/Pain Pain Assessment: No/denies pain    Home Living                      Prior Function            PT Goals (current goals can now be found in the care plan section) Acute Rehab PT Goals PT Goal Formulation: With patient Time For Goal Achievement: 11/10/17 Potential to Achieve Goals: Good Progress towards PT goals: Progressing toward goals    Frequency    Min 3X/week      PT Plan Current plan remains appropriate    Co-evaluation  AM-PAC PT "6 Clicks" Daily Activity  Outcome Measure  Difficulty turning over in bed (including adjusting bedclothes, sheets and blankets)?: None Difficulty moving from lying on back to sitting on the side of the bed? : None Difficulty sitting down on and standing up from a chair with arms (e.g., wheelchair, bedside commode, etc,.)?: Unable Help needed moving to and from a bed to chair (including a wheelchair)?: A Little Help needed walking in hospital room?: A Little Help needed climbing 3-5 steps with a railing? :  A Lot 6 Click Score: 17    End of Session Equipment Utilized During Treatment: Gait belt Activity Tolerance: Patient limited by fatigue Patient left: in bed;with call bell/phone within reach;with bed alarm set Nurse Communication: Mobility status PT Visit Diagnosis: Other abnormalities of gait and mobility (R26.89);History of falling (Z91.81);Unsteadiness on feet (R26.81)     Time: 1026-1050 PT Time Calculation (min) (ACUTE ONLY): 24 min  Charges:  $Therapeutic Activity: 23-37 mins                    G Codes:       [place] IIJennifer Markesia Crilly, South CarolinaPT, TennesseeDPT [phone]    Alessandra BevelsJennifer M Jonasia Coiner 10/28/2017, 2:48 PM

## 2017-10-28 NOTE — Progress Notes (Signed)
CSW met with patient and patient's sister at bedside to discuss SNF placement. Patient and patient's sister discussed how poor the care was at [facility] and how the patient would not be going back there. Patient's sister discussed facility options would be [facility] or [facility]. CSW checked in with facilities, and they likely won't have any beds open tomorrow. Patient's sister discussed how the patient would just have to stay until a bed opened up at one of those places. CSW told her that it didn't work like that; once the patient is medically stable, he needs to move to an appropriate level of care outside of the hospital.   [place] contacted patient's son, Edd Arbour, by phone to discuss SNF placement. Patient's son also discussed how awful [facility] was, and that they are not going to send the patient just anywhere because they don't want to traumatize him. Patient's son also discussed that [facility] would be an option for SNF. CSW to follow up tomorrow.  Laveda Abbe, [place] Clinical Social Worker [phone]

## 2017-10-29 LAB — MRSA PCR SCREENING: MRSA BY PCR: NEGATIVE

## 2017-10-29 NOTE — Progress Notes (Signed)
   10/29/17 1000  Clinical Encounter Type  Visited With Patient  Visit Type Follow-up;Spiritual support  Referral From Nurse  Consult/Referral To Chaplain  Spiritual Encounters  Spiritual Needs Prayer  Stress Factors  Patient Stress Factors Health changes;Major life changes  Chaplain visited with the PT as directed by consult.  The request was for prayer and while PT didn't ask for it, Chaplain provided prayer.  Chaplain has visited with the PT in the past and request for prayer was not well received by PT.

## 2017-10-29 NOTE — Progress Notes (Signed)
CSW continuing to follow for discharge plan. Patient continues to wait for [employer] authorization to admit to SNF to continue rehab. CSW contacted on call nurse earlier today to check on status and provide facility update, and was told that they are continuing to work on the case. CSW contacted patient's family members [name], [place], [place], and [place]) to inform them that authorization is still not received at this time. CSW met with patient to discuss status, as well.  CSW will continue to follow.  Laveda Abbe, [place] Clinical Social Worker [phone]

## 2017-10-29 NOTE — Progress Notes (Signed)
Pt noted to be be shaking on his abd. Prn ativan administered as ordered. Will continue to closely monitor pt. Dionne BucyP. Amo Jance Siek RN

## 2017-10-29 NOTE — Progress Notes (Signed)
Pt has discharge summary in place. Agree with reported plan. STable for discharge once bed available at SNF.  Gen: Pt in nad, alert and awake CV: no cyanosis Pulm: no increased wob, no wheezes   Penny Piarlando [place], MD

## 2017-10-29 NOTE — Care Management Note (Signed)
Case Management Note  Patient Details  Name: Samuel NorwayWilliam T Vega MRN: [id number] Date of Birth: 09/10/1923  Subjective/Objective:                    Action/Plan: Pt discharging to SNF. CM signing off.  Expected Discharge Date:  10/28/17               Expected Discharge Plan:  Skilled Nursing Facility  In-House Referral:  Clinical Social Work  Discharge planning Services     Post Acute Care Choice:    Choice offered to:     DME Arranged:    DME Agency:     HH Arranged:    HH Agency:     Status of Service:  Completed, signed off  If discussed at [employer] Meetings, dates discussed:    Additional Comments:  Samuel BaloKelli F Jeanita Carneiro, RN 10/29/2017, 3:06 PM

## 2017-10-30 DIAGNOSIS — I2584 Coronary atherosclerosis due to calcified coronary lesion: Secondary | ICD-10-CM | POA: Diagnosis not present

## 2017-10-30 DIAGNOSIS — R1312 Dysphagia, oropharyngeal phase: Secondary | ICD-10-CM | POA: Diagnosis not present

## 2017-10-30 DIAGNOSIS — M6281 Muscle weakness (generalized): Secondary | ICD-10-CM | POA: Diagnosis not present

## 2017-10-30 DIAGNOSIS — I639 Cerebral infarction, unspecified: Secondary | ICD-10-CM | POA: Diagnosis not present

## 2017-10-30 DIAGNOSIS — R251 Tremor, unspecified: Secondary | ICD-10-CM | POA: Diagnosis not present

## 2017-10-30 DIAGNOSIS — G464 Cerebellar stroke syndrome: Secondary | ICD-10-CM | POA: Diagnosis not present

## 2017-10-30 DIAGNOSIS — R41841 Cognitive communication deficit: Secondary | ICD-10-CM | POA: Diagnosis not present

## 2017-10-30 DIAGNOSIS — I25110 Atherosclerotic heart disease of native coronary artery with unstable angina pectoris: Secondary | ICD-10-CM | POA: Diagnosis not present

## 2017-10-30 DIAGNOSIS — I10 Essential (primary) hypertension: Secondary | ICD-10-CM | POA: Diagnosis not present

## 2017-10-30 DIAGNOSIS — I200 Unstable angina: Secondary | ICD-10-CM | POA: Diagnosis present

## 2017-10-30 DIAGNOSIS — Z7982 Long term (current) use of aspirin: Secondary | ICD-10-CM | POA: Diagnosis not present

## 2017-10-30 DIAGNOSIS — Z888 Allergy status to other drugs, medicaments and biological substances status: Secondary | ICD-10-CM | POA: Diagnosis not present

## 2017-10-30 DIAGNOSIS — I69319 Unspecified symptoms and signs involving cognitive functions following cerebral infarction: Secondary | ICD-10-CM | POA: Diagnosis not present

## 2017-10-30 DIAGNOSIS — M6389 Disorders of muscle in diseases classified elsewhere, multiple sites: Secondary | ICD-10-CM | POA: Diagnosis not present

## 2017-10-30 DIAGNOSIS — R253 Fasciculation: Secondary | ICD-10-CM | POA: Diagnosis not present

## 2017-10-30 DIAGNOSIS — I69318 Other symptoms and signs involving cognitive functions following cerebral infarction: Secondary | ICD-10-CM | POA: Diagnosis not present

## 2017-10-30 DIAGNOSIS — E785 Hyperlipidemia, unspecified: Secondary | ICD-10-CM | POA: Diagnosis not present

## 2017-10-30 DIAGNOSIS — I69354 Hemiplegia and hemiparesis following cerebral infarction affecting left non-dominant side: Secondary | ICD-10-CM | POA: Diagnosis not present

## 2017-10-30 DIAGNOSIS — R2681 Unsteadiness on feet: Secondary | ICD-10-CM | POA: Diagnosis not present

## 2017-10-30 DIAGNOSIS — R569 Unspecified convulsions: Secondary | ICD-10-CM | POA: Diagnosis not present

## 2017-10-30 DIAGNOSIS — F4310 Post-traumatic stress disorder, unspecified: Secondary | ICD-10-CM | POA: Diagnosis not present

## 2017-10-30 DIAGNOSIS — H3530 Unspecified macular degeneration: Secondary | ICD-10-CM | POA: Diagnosis not present

## 2017-10-30 DIAGNOSIS — R278 Other lack of coordination: Secondary | ICD-10-CM | POA: Diagnosis not present

## 2017-10-30 DIAGNOSIS — I350 Nonrheumatic aortic (valve) stenosis: Secondary | ICD-10-CM | POA: Diagnosis not present

## 2017-10-30 NOTE — Clinical Social Work Placement (Signed)
   CLINICAL SOCIAL WORK PLACEMENT  NOTE  Date:  10/30/2017  Patient Details  Name: Samuel Vega MRN: [id number] Date of Birth: 07/25/1924  Clinical Social Work is seeking post-discharge placement for this patient at the   level of care (*CSW will initial, date and re-position this form in  chart as items are completed):      Patient/family provided with [facility] Clinical Social Work Department's list of facilities offering this level of care within the geographic area requested by the patient (or if unable, by the patient's family).      Patient/family informed of their freedom to choose among providers that offer the needed level of care, that participate in Medicare, Medicaid or managed care program needed by the patient, have an available bed and are willing to accept the patient.      Patient/family informed of [place]'s ownership interest in [facility] and [facility], as well as of the fact that they are under no obligation to receive care at these facilities.  PASRR submitted to EDS on       PASRR number received on 10/11/17     Existing PASRR number confirmed on       FL2 transmitted to all facilities in geographic area requested by pt/family on 10/11/17     FL2 transmitted to all facilities within larger geographic area on       Patient informed that his/her managed care company has contracts with or will negotiate with certain facilities, including the following:        Yes   Patient/family informed of bed offers received.  Patient chooses bed at [facility] [place]     Physician recommends and patient chooses bed at      Patient to be transferred to [facility] [place] [place] on 10/30/17.  Patient to be transferred to facility by [facility]     Patient family notified on 10/30/17 of transfer.  Name of family member notified:  Effie BerkshireGlenn, Nephew     PHYSICIAN       Additional Comment:    _______________________________________________ Maree KrabbeBridget A  Eilam Shrewsbury, LCSW 10/30/2017, 10:16 AM

## 2017-10-30 NOTE — Consult Note (Addendum)
   [facility]   10/30/2017  Concha NorwayWilliam T Badon 12/22/1923 [id number]    [facility] Care Management follow up.   Chart reviewed. Noted [facility] [place] [facility]. Confirmed with inpatient RNCM.   Patient [facility] prior to hospitalization. He [facility] L[facility]W prior to hospital admission.   Will make referral for [facility] LCSW follow up at [facility] [place] [facility] due to facility change.    Raiford NobleAtika Baer Hinton, MSN-Ed, RN,BSN [facility] Care Management Hospital Liaison [phone]

## 2017-10-30 NOTE — Clinical Social Work Note (Signed)
Clinical Social Worker facilitated patient discharge including contacting patient family (nephew) and facility to confirm patient discharge plans.  Clinical information faxed to facility and family agreeable with plan.  CSW arranged ambulance transport via [facility] to [facility] [place] room number 101 A.  RN to call [phone] for report prior to discharge.  Clinical Social Worker will sign off for now as social work intervention is no longer needed. Please consult us again if new need arises.  MadisonBridget Ingris Pasquarella, ConnecticutLCSWA [phone]

## 2017-10-30 NOTE — Progress Notes (Signed)
Patient is discharged to [facility] [place]. Telemetry and IV discontinued.  Cardiac monitor from patient cardiologist placed.  Patient is alert, orientedx4.  Lucila MaineGrandson is at the bedside.  Report given to the receiving nurse at [facility].  Patient left the floor to [facility] nursing facility via ambulance.  Vital signs stable upon discharge.

## 2017-10-30 NOTE — Care Management Important Message (Signed)
Important Message  Patient Details  Name: [name] MRN: [id number] Date of Birth: 01/31/1924   Medicare Important Message Given:  Yes    Dorena BodoIris Lavere Stork 10/30/2017, 4:11 PM

## 2017-10-30 NOTE — Progress Notes (Signed)
Patient to continue plans as reported in discharge summary. VSS  Once bed available may transition to SNF.  Penny Piarlando Saveah Bahar, MD

## 2017-11-01 DIAGNOSIS — I350 Nonrheumatic aortic (valve) stenosis: Secondary | ICD-10-CM | POA: Diagnosis not present

## 2017-11-01 DIAGNOSIS — I639 Cerebral infarction, unspecified: Secondary | ICD-10-CM | POA: Diagnosis not present

## 2017-11-01 DIAGNOSIS — R253 Fasciculation: Secondary | ICD-10-CM | POA: Diagnosis not present

## 2017-11-01 DIAGNOSIS — R569 Unspecified convulsions: Secondary | ICD-10-CM | POA: Diagnosis not present

## 2017-11-02 ENCOUNTER — Other Ambulatory Visit: Payer: Self-pay | Admitting: *Deleted

## 2017-11-02 ENCOUNTER — Other Ambulatory Visit: Payer: PPO | Admitting: *Deleted

## 2017-11-02 DIAGNOSIS — I350 Nonrheumatic aortic (valve) stenosis: Secondary | ICD-10-CM

## 2017-11-03 ENCOUNTER — Telehealth: Payer: Self-pay | Admitting: *Deleted

## 2017-11-03 ENCOUNTER — Ambulatory Visit: Payer: Self-pay | Admitting: *Deleted

## 2017-11-03 LAB — CBC
HEMOGLOBIN: 12.3 g/dL — AB (ref 13.0–17.7)
Hematocrit: 38 % (ref 37.5–51.0)
MCH: 27.2 pg (ref 26.6–33.0)
MCHC: 32.4 g/dL (ref 31.5–35.7)
MCV: 84 fL (ref 79–97)
Platelets: 180 10*3/uL (ref 150–379)
RBC: 4.53 x10E6/uL (ref 4.14–5.80)
RDW: 16 % — ABNORMAL HIGH (ref 12.3–15.4)
WBC: 5.8 10*3/uL (ref 3.4–10.8)

## 2017-11-03 LAB — BASIC METABOLIC PANEL
BUN / CREAT RATIO: 11 (ref 10–24)
BUN: 12 mg/dL (ref 10–36)
CO2: 25 mmol/L (ref 20–29)
CREATININE: 1.08 mg/dL (ref 0.76–1.27)
Calcium: 9.3 mg/dL (ref 8.6–10.2)
Chloride: 105 mmol/L (ref 96–106)
GFR calc non Af Amer: 60 mL/min/{1.73_m2} (ref >59)
GFR, EST AFRICAN AMERICAN: 69 mL/min/{1.73_m2} (ref >59)
Glucose: 88 mg/dL (ref 65–99)
Potassium: 4.1 mmol/L (ref 3.5–5.2)
Sodium: 143 mmol/L (ref 134–144)

## 2017-11-03 LAB — PROTIME-INR
INR: 1 (ref 0.8–1.2)
Prothrombin Time: 10.5 s (ref 9.1–12.0)

## 2017-11-03 NOTE — Telephone Encounter (Addendum)
[facility] for: Wednesday November 04, 2017 8:30 AM Verify arrival time and place[facility] [place] at: 6:30 AM Nothing to eat or drink after midnight night before cath.  Verified AM meds can be  taken pre-cath with sip of water including: ASA 325 mg    Confirmed patient has responsible person to drive home post procedure and observe patient for 24 hours: yes  I spoke with patient's daughter-in-law, discussed instructions, she verbalized understanding.

## 2017-11-03 NOTE — Patient Outreach (Signed)
[facility] Care Management  11/03/2017  Samuel NorwayWilliam T Vega 05/31/1924 [id number]   CSW did nor hear back from son yesterday and attempted outreach again today- leaving voicemail.  CSW also attempted to visit pt at [facility] today but was unsuccessful.  Per SNF staff, [place].  CSW will attempt phone outreach later in the week after surgery.   Samuel LevyJanet [name], MSW, LCSW Clinical Social Worker  [facility] [phone]

## 2017-11-04 ENCOUNTER — Other Ambulatory Visit: Payer: Self-pay

## 2017-11-04 ENCOUNTER — Encounter (HOSPITAL_COMMUNITY)
Admission: RE | Disposition: A | Discharge status: Skilled Nursing Facility | Payer: Self-pay | Source: Ambulatory Visit | Attending: Cardiovascular Disease

## 2017-11-04 ENCOUNTER — Encounter (HOSPITAL_COMMUNITY): Payer: Self-pay | Admitting: Cardiovascular Disease

## 2017-11-04 ENCOUNTER — Ambulatory Visit (HOSPITAL_COMMUNITY)
Admission: RE | Admit: 2017-11-04 | Discharge: 2017-11-06 | Disposition: A | Discharge status: Skilled Nursing Facility | Payer: PPO | Source: Ambulatory Visit | Attending: Cardiovascular Disease | Admitting: Cardiovascular Disease

## 2017-11-04 DIAGNOSIS — I350 Nonrheumatic aortic (valve) stenosis: Secondary | ICD-10-CM | POA: Diagnosis not present

## 2017-11-04 DIAGNOSIS — I69319 Unspecified symptoms and signs involving cognitive functions following cerebral infarction: Secondary | ICD-10-CM | POA: Diagnosis not present

## 2017-11-04 DIAGNOSIS — E785 Hyperlipidemia, unspecified: Secondary | ICD-10-CM | POA: Diagnosis not present

## 2017-11-04 DIAGNOSIS — Z955 Presence of coronary angioplasty implant and graft: Secondary | ICD-10-CM

## 2017-11-04 DIAGNOSIS — I2584 Coronary atherosclerosis due to calcified coronary lesion: Secondary | ICD-10-CM | POA: Insufficient documentation

## 2017-11-04 DIAGNOSIS — H3530 Unspecified macular degeneration: Secondary | ICD-10-CM | POA: Diagnosis not present

## 2017-11-04 DIAGNOSIS — F4310 Post-traumatic stress disorder, unspecified: Secondary | ICD-10-CM | POA: Diagnosis present

## 2017-11-04 DIAGNOSIS — I25110 Atherosclerotic heart disease of native coronary artery with unstable angina pectoris: Secondary | ICD-10-CM

## 2017-11-04 DIAGNOSIS — I10 Essential (primary) hypertension: Secondary | ICD-10-CM | POA: Diagnosis not present

## 2017-11-04 DIAGNOSIS — Z7982 Long term (current) use of aspirin: Secondary | ICD-10-CM | POA: Diagnosis not present

## 2017-11-04 DIAGNOSIS — I69354 Hemiplegia and hemiparesis following cerebral infarction affecting left non-dominant side: Secondary | ICD-10-CM | POA: Insufficient documentation

## 2017-11-04 DIAGNOSIS — R569 Unspecified convulsions: Secondary | ICD-10-CM

## 2017-11-04 DIAGNOSIS — I63421 Cerebral infarction due to embolism of right anterior cerebral artery: Secondary | ICD-10-CM | POA: Diagnosis present

## 2017-11-04 DIAGNOSIS — Z888 Allergy status to other drugs, medicaments and biological substances status: Secondary | ICD-10-CM | POA: Diagnosis not present

## 2017-11-04 DIAGNOSIS — I200 Unstable angina: Secondary | ICD-10-CM | POA: Diagnosis present

## 2017-11-04 HISTORY — PX: RIGHT/LEFT HEART CATH AND CORONARY ANGIOGRAPHY: CATH118266

## 2017-11-04 HISTORY — DX: Major depressive disorder, single episode, unspecified: F32.9

## 2017-11-04 HISTORY — DX: Other chronic pain: G89.29

## 2017-11-04 HISTORY — DX: Unspecified convulsions: R56.9

## 2017-11-04 HISTORY — DX: Atherosclerotic heart disease of native coronary artery without angina pectoris: I25.10

## 2017-11-04 HISTORY — DX: Essential (primary) hypertension: I10

## 2017-11-04 HISTORY — PX: CORONARY STENT INTERVENTION: CATH118234

## 2017-11-04 HISTORY — DX: Low back pain: M54.5

## 2017-11-04 HISTORY — DX: Low back pain, unspecified: M54.50

## 2017-11-04 HISTORY — DX: Depression, unspecified: F32.A

## 2017-11-04 HISTORY — DX: Nonrheumatic aortic (valve) stenosis: I35.0

## 2017-11-04 LAB — POCT I-STAT 3, VENOUS BLOOD GAS (G3P V)
Bicarbonate: 25.3 mmol/L (ref 20.0–28.0)
O2 SAT: 74 %
PCO2 VEN: 44.2 mmHg (ref 44.0–60.0)
TCO2: 27 mmol/L (ref 22–32)
pH, Ven: 7.366 (ref 7.250–7.430)
pO2, Ven: 41 mmHg (ref 32.0–45.0)

## 2017-11-04 LAB — POCT I-STAT 3, ART BLOOD GAS (G3+)
Bicarbonate: 24.5 mmol/L (ref 20.0–28.0)
O2 Saturation: 99 %
PCO2 ART: 38.9 mmHg (ref 32.0–48.0)
PH ART: 7.406 (ref 7.350–7.450)
TCO2: 26 mmol/L (ref 22–32)
pO2, Arterial: 146 mmHg — ABNORMAL HIGH (ref 83.0–108.0)

## 2017-11-04 LAB — POCT ACTIVATED CLOTTING TIME
ACTIVATED CLOTTING TIME: 241 s
ACTIVATED CLOTTING TIME: 912 s
Activated Clotting Time: 169 seconds

## 2017-11-04 SURGERY — RIGHT/LEFT HEART CATH AND CORONARY ANGIOGRAPHY
Anesthesia: LOCAL

## 2017-11-04 MED ORDER — SODIUM CHLORIDE 0.9 % WEIGHT BASED INFUSION
3.0000 mL/kg/h | INTRAVENOUS | Status: DC
  Administered 2017-11-04: 3 mL/kg/h via INTRAVENOUS

## 2017-11-04 MED ORDER — HEPARIN (PORCINE) IN NACL 2-0.9 UNIT/ML-% IJ SOLN
INTRAMUSCULAR | Status: AC | PRN
  Administered 2017-11-04 (×2): 500 mL

## 2017-11-04 MED ORDER — LEVETIRACETAM 500 MG PO TABS
500.0000 mg | ORAL_TABLET | Freq: Two times a day (BID) | ORAL | Status: DC
  Administered 2017-11-04 – 2017-11-06 (×4): 500 mg via ORAL
  Filled 2017-11-04 (×4): qty 1

## 2017-11-04 MED ORDER — SODIUM CHLORIDE 0.9 % WEIGHT BASED INFUSION: 1.0000 mL/kg/h | INTRAVENOUS | Status: DC

## 2017-11-04 MED ORDER — SODIUM CHLORIDE 0.9% FLUSH
3.0000 mL | Freq: Two times a day (BID) | INTRAVENOUS | Status: DC
  Administered 2017-11-04 – 2017-11-05 (×3): 3 mL via INTRAVENOUS

## 2017-11-04 MED ORDER — SODIUM CHLORIDE 0.9 % IV SOLN: INTRAVENOUS | Status: AC

## 2017-11-04 MED ORDER — CLOPIDOGREL BISULFATE 75 MG PO TABS
ORAL_TABLET | ORAL | Status: AC
  Filled 2017-11-04: qty 1

## 2017-11-04 MED ORDER — SODIUM CHLORIDE 0.9% FLUSH: 3.0000 mL | Freq: Two times a day (BID) | INTRAVENOUS | Status: DC

## 2017-11-04 MED ORDER — HYDRALAZINE HCL 20 MG/ML IJ SOLN: 5.0000 mg | INTRAMUSCULAR | Status: AC | PRN

## 2017-11-04 MED ORDER — NITROGLYCERIN 1 MG/10 ML FOR IR/CATH LAB
INTRA_ARTERIAL | Status: DC | PRN
  Administered 2017-11-04: 200 ug via INTRACORONARY

## 2017-11-04 MED ORDER — HEPARIN SODIUM (PORCINE) 1000 UNIT/ML IJ SOLN
INTRAMUSCULAR | Status: DC | PRN
  Administered 2017-11-04: 4000 [IU] via INTRAVENOUS
  Administered 2017-11-04: 6000 [IU] via INTRAVENOUS

## 2017-11-04 MED ORDER — SODIUM CHLORIDE 0.9 % IV SOLN: 250.0000 mL | INTRAVENOUS | Status: DC | PRN

## 2017-11-04 MED ORDER — IOPAMIDOL (ISOVUE-370) INJECTION 76%
INTRAVENOUS | Status: AC
  Filled 2017-11-04: qty 125

## 2017-11-04 MED ORDER — HEPARIN (PORCINE) IN NACL 2-0.9 UNIT/ML-% IJ SOLN
INTRAMUSCULAR | Status: AC
  Filled 2017-11-04: qty 1000

## 2017-11-04 MED ORDER — LABETALOL HCL 5 MG/ML IV SOLN: 10.0000 mg | INTRAVENOUS | Status: AC | PRN

## 2017-11-04 MED ORDER — ASPIRIN 81 MG PO CHEW
81.0000 mg | CHEWABLE_TABLET | Freq: Every day | ORAL | Status: DC
  Administered 2017-11-05 – 2017-11-06 (×2): 81 mg via ORAL
  Filled 2017-11-04 (×2): qty 1

## 2017-11-04 MED ORDER — ONDANSETRON HCL 4 MG/2ML IJ SOLN: 4.0000 mg | Freq: Four times a day (QID) | INTRAMUSCULAR | Status: DC | PRN

## 2017-11-04 MED ORDER — CLOPIDOGREL BISULFATE 75 MG PO TABS
75.0000 mg | ORAL_TABLET | Freq: Every day | ORAL | Status: DC
  Administered 2017-11-05 – 2017-11-06 (×2): 75 mg via ORAL
  Filled 2017-11-04 (×2): qty 1

## 2017-11-04 MED ORDER — CLOPIDOGREL BISULFATE 75 MG PO TABS
ORAL_TABLET | ORAL | Status: AC
  Filled 2017-11-04: qty 2

## 2017-11-04 MED ORDER — SODIUM CHLORIDE 0.9% FLUSH: 3.0000 mL | INTRAVENOUS | Status: DC | PRN

## 2017-11-04 MED ORDER — ACETAMINOPHEN 325 MG PO TABS: 650.0000 mg | ORAL_TABLET | ORAL | Status: DC | PRN

## 2017-11-04 MED ORDER — LIDOCAINE HCL (PF) 1 % IJ SOLN
INTRAMUSCULAR | Status: DC | PRN
  Administered 2017-11-04: 2 mL
  Administered 2017-11-04: 15 mL

## 2017-11-04 MED ORDER — ASPIRIN 81 MG PO CHEW: 324.0000 mg | CHEWABLE_TABLET | ORAL | Status: DC

## 2017-11-04 MED ORDER — LIDOCAINE HCL (PF) 1 % IJ SOLN
INTRAMUSCULAR | Status: AC
  Filled 2017-11-04: qty 30

## 2017-11-04 MED ORDER — VERAPAMIL HCL 2.5 MG/ML IV SOLN
INTRAVENOUS | Status: DC | PRN
  Administered 2017-11-04: 10 mL via INTRA_ARTERIAL

## 2017-11-04 MED ORDER — ANGIOPLASTY BOOK
Freq: Once | Status: AC
  Administered 2017-11-04: 22:00:00
  Filled 2017-11-04: qty 1

## 2017-11-04 MED ORDER — IOPAMIDOL (ISOVUE-370) INJECTION 76%
INTRAVENOUS | Status: DC | PRN
  Administered 2017-11-04: 125 mL

## 2017-11-04 MED ORDER — CLOPIDOGREL BISULFATE 75 MG PO TABS
ORAL_TABLET | ORAL | Status: AC
  Filled 2017-11-04: qty 5

## 2017-11-04 MED ORDER — CLOPIDOGREL BISULFATE 75 MG PO TABS
ORAL_TABLET | ORAL | Status: DC | PRN
  Administered 2017-11-04: 600 mg via ORAL

## 2017-11-04 SURGICAL SUPPLY — 28 items
BALLN SAPPHIRE 2.5X12 (BALLOONS) ×2
BALLN SAPPHIRE ~~LOC~~ 3.0X12 (BALLOONS) ×1 IMPLANT
BALLOON SAPPHIRE 2.5X12 (BALLOONS) IMPLANT
BAND CMPR LRG ZPHR (HEMOSTASIS) ×1
BAND ZEPHYR COMPRESS 30 LONG (HEMOSTASIS) ×1 IMPLANT
CATH IMPULSE 5F ANG/FL3.5 (CATHETERS) ×1 IMPLANT
CATH INFINITI 5 FR AL2 (CATHETERS) ×1 IMPLANT
CATH LAUNCHER 6FR AL1 (CATHETERS) IMPLANT
CATH SWAN GANZ 7F STRAIGHT (CATHETERS) ×1 IMPLANT
CATH VISTA GUIDE 6FR JR4 (CATHETERS) ×1 IMPLANT
CATHETER LAUNCHER 6FR AL1 (CATHETERS) ×2
COVER PRB 48X5XTLSCP FOLD TPE (BAG) IMPLANT
COVER PROBE 5X48 (BAG) ×2
GUIDEWIRE INQWIRE 1.5J.035X260 (WIRE) IMPLANT
INQWIRE 1.5J .035X260CM (WIRE) ×2
KIT ENCORE 26 ADVANTAGE (KITS) ×1 IMPLANT
KIT HEART LEFT (KITS) ×2 IMPLANT
NDL PERC 21GX4CM (NEEDLE) IMPLANT
NEEDLE PERC 21GX4CM (NEEDLE) ×2 IMPLANT
PACK CARDIAC CATHETERIZATION (CUSTOM PROCEDURE TRAY) ×2 IMPLANT
SHEATH AVANTI 11CM 7FR (SHEATH) ×1 IMPLANT
SHEATH RAIN RADIAL 21G 6FR (SHEATH) ×1 IMPLANT
STENT SYNERGY DES 2.75X20 (Permanent Stent) ×1 IMPLANT
TRANSDUCER W/STOPCOCK (MISCELLANEOUS) ×2 IMPLANT
TUBING CIL FLEX 10 FLL-RA (TUBING) ×2 IMPLANT
WIRE COUGAR XT STRL 190CM (WIRE) ×1 IMPLANT
WIRE EMERALD ST .035X150CM (WIRE) ×1 IMPLANT
WIRE HI TORQ VERSACORE-J 145CM (WIRE) ×1 IMPLANT

## 2017-11-04 NOTE — Interval H&P Note (Signed)
History and Physical Interval Note:  11/04/2017 7:43 AM  Concha NorwayWilliam T Mangold  has presented today for cardiac cath with the diagnosis of severe AS, pre TAVR. The various methods of treatment have been discussed with the patient and family. After consideration of risks, benefits and other options for treatment, the patient has consented to  Procedure(s): RIGHT/LEFT HEART CATH AND CORONARY ANGIOGRAPHY (N/A) as a surgical intervention .  The patient's history has been reviewed, patient examined, no change in status, stable for surgery.  I have reviewed the patient's chart and labs.  Questions were answered to the patient's satisfaction.    Cath Lab Visit (complete for each Cath Lab visit)  Clinical Evaluation Leading to the Procedure:   ACS: No.  Non-ACS:    Anginal Classification: CCS II  Anti-ischemic medical therapy: No Therapy  Non-Invasive Test Results: No non-invasive testing performed  Prior CABG: No previous CABG         Verne Carrowhristopher McAlhany

## 2017-11-04 NOTE — Clinical Social Work Note (Signed)
Clinical Social Work Assessment  Patient Details  Name: Samuel Vega MRN: [id number] Date of Birth: [birth year]  Date of referral:  11/04/17               Reason for consult:  Discharge Planning, Facility Placement(Patient from [facility])                Permission sought to share information with:  Family Supports Permission granted to share information::  Yes, Verbal Permission Granted  Name::     Jerilee Field and [employer]  Agency::     Relationship::  Daughter-in-law(Mrs. Twaddle was at the bedside during CSW's visit.) Ronnie - [phone].  Contact Information:  [phone] after 11 am)  Housing/Transportation Living arrangements for the past 2 months:  Skilled Nursing Facility(Patient has been [facility] and later to [facility] where he is currently receiving ST rehab) Source of Information:  Patient, Other (Comment Required)(Daughter-in-law Samuel Vega) Patient Interpreter Needed:  None Criminal Activity/Legal Involvement Pertinent to Current Situation/Hospitalization:  No - Comment as needed Significant Relationships:  Adult Children, Other Family Members Lives with:  Facility Resident(From [facility] [place]) Do you feel safe going back to the place where you live?  Yes Need for family participation in patient care:  Yes (Comment)  Care giving concerns:  Patient, nor daughter-in-law expressed any concerns regarding his care at [facility], where he is receiving ST rehab.  Social Worker assessment / plan:  CSW talked with patient and his daughter-in-law, Samuel Vega at the bedside. Samuel Vega reported that her husband is currently out-of-town. [name] was sitting up in bed and was alert, oriented, friendly and very engaged in the conversation. Patient and CSW shared a laugh as he talked about Melinda Crutch and the [employer] slide'. Patient informed CSW that he has another son, Dannielle Huh who lives in [place].  CSW confirmed that patient  is from [facility] and plans to return at discharge. Patient and daughter-in-law explained that he has had a stroke and heart attack and after initial hospitalization he discharged to [facility]. He ended back in the hospital and then discharged to [facility] and patient/family are pleased with the care he is receiving. Patient indicated that he is legally blind and uses glasses to read. He is hard-of-hearing and has a hearing aide.  When asked, daughter-in-law Samuel Vega informed CSW that patient will transition to [place] [facility] after his rehab has ended, and all that is left to do is pay the facility.  Employment status:  Retired Retail buyer) PT Recommendations:  Not assessed at this time(PT/OT orders have been placed) Information / Referral to community resources:  Other (Comment Required)(None needed or requested as patient from a skilled facility)  Patient/Family's Response to care: No concerns expressed regarding patient's care during hospitalization.  Patient/Family's Understanding of and Emotional Response to Diagnosis, Current Treatment, and Prognosis: Patient expressed understanding regarding why he is in the hospital and Samuel Vega advised CSW that patient will have another hospitalization soon for another procedure. The family is aware that patient cannot return home alone and have made arrangements for him to enter an ALF after leaving rehab.  Emotional Assessment Appearance:  Appears younger than stated age Attitude/Demeanor/Rapport:  Engaged Affect (typically observed):  Accepting, Pleasant, Appropriate Orientation:  Oriented to Self, Oriented to Place, Oriented to Situation, Oriented to  Time Alcohol / Substance use:  Alcohol Use, Illicit Drugs, Not Applicable(Patient reported that he has never smoked and does not drink or use  illicit drugs) Psych involvement (Current and /or in the community):  No (Comment)  Discharge Needs  Concerns to  be addressed:  Discharge Planning Concerns Readmission within the last 30 days:  Yes Current discharge risk:  None Barriers to Discharge:  Continued Medical Work up, Insurance Authorization   Cristobal GoldmannCrawford, Vilas Edgerly Bradley, LCSW 11/04/2017, 4:24 PM

## 2017-11-04 NOTE — Evaluation (Signed)
Physical Therapy Evaluation Patient Details Name: Samuel Vega MRN: [id number] DOB: 11/10/1923 Today's Date: 11/04/2017   History of Present Illness  Pt is a 84 y/o male s/p heart cath. Pt presenting from SNF. PMH includes CVA with L sided weakness, PTSD, cardiac murmur, and hearing loss.   Clinical Impression  Pt is s/p procedure above with deficits below. Pt presenting with poor awareness of post cath precautions, cognitive deficits, and L sided weakness (prior CVA). Pt also impulsive and requiring safety cues throughout. Reported some dizziness upon standing, so mobility limited to EOB. Required min A for steadying during transfers and marching at EOB with RW. Pt is currently a high fall risk and recommend return to SNF at d/c to increase safety with mobility. Will continue to follow acutely to maximize functional mobility independence and safety.     Follow Up Recommendations SNF;Supervision for mobility/OOB    Equipment Recommendations  None recommended by PT    Recommendations for Other Services       Precautions / Restrictions Precautions Precautions: Fall Restrictions Weight Bearing Restrictions: No      Mobility  Bed Mobility Overal bed mobility: Needs Assistance Bed Mobility: Supine to Sit;Sit to Supine     Supine to sit: Min assist Sit to supine: Min guard   General bed mobility comments: Min A for sequencing and scooting hips to EOB. Also required assist for trunk elevation. Min guard for safety to return to supine.   Transfers Overall transfer level: Needs assistance Equipment used: Rolling walker (2 wheeled) Transfers: Sit to/from Stand Sit to Stand: Min assist         General transfer comment: Min A for steadying and lift assist. Verbal cues not to pull up from RUE to maintain precautions. Dizziness reported upon standing; VSS. Further mobility deferred.   Ambulation/Gait                Stairs            Wheelchair Mobility     Modified Rankin (Stroke Patients Only)       Balance Overall balance assessment: Needs assistance Sitting-balance support: Feet supported;No upper extremity supported Sitting balance-Leahy Scale: Fair     Standing balance support: During functional activity;Bilateral upper extremity supported Standing balance-Leahy Scale: Poor Standing balance comment: reliant on UE support for static standing and external support for dynamic standing due to instability                              Pertinent Vitals/Pain Pain Assessment: Faces Faces Pain Scale: No hurt    Home Living Family/patient expects to be discharged to:: Skilled nursing facility                 Additional Comments: [facility] in [place]     Prior Function Level of Independence: Needs assistance   Gait / Transfers Assistance Needed: Used WC for mobility mostly. Was working with PT on ambulating with RW.   ADL's / Homemaking Assistance Needed: Needs assist for ALDs from staff.         Hand Dominance   Dominant Hand: Right    Extremity/Trunk Assessment   Upper Extremity Assessment Upper Extremity Assessment: Defer to OT evaluation    Lower Extremity Assessment Lower Extremity Assessment: LLE deficits/detail LLE Deficits / Details: At least 3/5 throughout. Functional weakness noted in standing.     Cervical / Trunk Assessment Cervical / Trunk Assessment: Kyphotic  Communication  Communication: HOH  Cognition Arousal/Alertness: Awake/alert Behavior During Therapy: Impulsive Overall Cognitive Status: No family/caregiver present to determine baseline cognitive functioning Area of Impairment: Attention;Memory;Safety/judgement;Problem solving;Following commands                   Current Attention Level: Sustained Memory: Decreased recall of precautions;Decreased short-term memory Following Commands: Follows one step commands with increased time Safety/Judgement: Decreased  awareness of safety;Decreased awareness of deficits   Problem Solving: Slow processing;Difficulty sequencing;Requires verbal cues General Comments: Pt easily distracted requiring redirection to task. Required multiple cues to maintain post cath precautions. Pt with poor safety awareness. Pt slightly impulsive and required safety cues throughout.      General Comments      Exercises General Exercises - Lower Extremity Hip Flexion/Marching: AROM;Both;15 reps;Standing(with RW)   Assessment/Plan    PT Assessment Patient needs continued PT services  PT Problem List Decreased strength;Decreased mobility;Decreased safety awareness;Decreased activity tolerance;Decreased balance;Decreased knowledge of use of DME;Decreased coordination;Decreased cognition;Decreased knowledge of precautions       PT Treatment Interventions DME instruction;Functional mobility training;Balance training;Patient/family education;Gait training;Therapeutic activities;Neuromuscular re-education;Therapeutic exercise;Cognitive remediation    PT Goals (Current goals can be found in the Care Plan section)  Acute Rehab PT Goals Patient Stated Goal: to go back to [facility]  PT Goal Formulation: With patient Time For Goal Achievement: 11/18/17 Potential to Achieve Goals: Good    Frequency Min 2X/week   Barriers to discharge        Co-evaluation               AM-PAC PT "6 Clicks" Daily Activity  Outcome Measure Difficulty turning over in bed (including adjusting bedclothes, sheets and blankets)?: A Little Difficulty moving from lying on back to sitting on the side of the bed? : Unable Difficulty sitting down on and standing up from a chair with arms (e.g., wheelchair, bedside commode, etc,.)?: Unable Help needed moving to and from a bed to chair (including a wheelchair)?: A Little Help needed walking in hospital room?: A Lot Help needed climbing 3-5 steps with a railing? : A Lot 6 Click Score: 12    End of  Session Equipment Utilized During Treatment: Gait belt Activity Tolerance: Patient tolerated treatment well Patient left: in bed;with call bell/phone within reach;with nursing/sitter in room Nurse Communication: Mobility status(need for bed alarm ) PT Visit Diagnosis: Other abnormalities of gait and mobility (R26.89);History of falling (Z91.81);Unsteadiness on feet (R26.81);Muscle weakness (generalized) (M62.81) Hemiplegia - Right/Left: Left Hemiplegia - dominant/non-dominant: Non-dominant Hemiplegia - caused by: Cerebral infarction    Time: 4098-1191 PT Time Calculation (min) (ACUTE ONLY): 25 min   Charges:   PT Evaluation $PT Eval Moderate Complexity: 1 Mod PT Treatments $Therapeutic Activity: 8-22 mins   PT G Codes:        Gladys Damme, PT, DPT  Acute Rehabilitation Services  Pager: [phone]   Lehman Prom 11/04/2017, 6:05 PM

## 2017-11-05 ENCOUNTER — Telehealth: Payer: Self-pay | Admitting: Cardiovascular Disease

## 2017-11-05 ENCOUNTER — Encounter (HOSPITAL_COMMUNITY): Payer: Self-pay | Admitting: Cardiology

## 2017-11-05 DIAGNOSIS — H3530 Unspecified macular degeneration: Secondary | ICD-10-CM | POA: Diagnosis not present

## 2017-11-05 DIAGNOSIS — Z7982 Long term (current) use of aspirin: Secondary | ICD-10-CM | POA: Diagnosis not present

## 2017-11-05 DIAGNOSIS — I25110 Atherosclerotic heart disease of native coronary artery with unstable angina pectoris: Secondary | ICD-10-CM | POA: Diagnosis not present

## 2017-11-05 DIAGNOSIS — Z888 Allergy status to other drugs, medicaments and biological substances status: Secondary | ICD-10-CM | POA: Diagnosis not present

## 2017-11-05 DIAGNOSIS — I69319 Unspecified symptoms and signs involving cognitive functions following cerebral infarction: Secondary | ICD-10-CM | POA: Diagnosis not present

## 2017-11-05 DIAGNOSIS — R569 Unspecified convulsions: Secondary | ICD-10-CM | POA: Diagnosis not present

## 2017-11-05 DIAGNOSIS — I69354 Hemiplegia and hemiparesis following cerebral infarction affecting left non-dominant side: Secondary | ICD-10-CM | POA: Diagnosis not present

## 2017-11-05 DIAGNOSIS — F4310 Post-traumatic stress disorder, unspecified: Secondary | ICD-10-CM | POA: Diagnosis not present

## 2017-11-05 DIAGNOSIS — I350 Nonrheumatic aortic (valve) stenosis: Secondary | ICD-10-CM | POA: Diagnosis not present

## 2017-11-05 DIAGNOSIS — I2584 Coronary atherosclerosis due to calcified coronary lesion: Secondary | ICD-10-CM | POA: Diagnosis not present

## 2017-11-05 DIAGNOSIS — Z955 Presence of coronary angioplasty implant and graft: Secondary | ICD-10-CM

## 2017-11-05 DIAGNOSIS — I10 Essential (primary) hypertension: Secondary | ICD-10-CM | POA: Diagnosis not present

## 2017-11-05 DIAGNOSIS — E785 Hyperlipidemia, unspecified: Secondary | ICD-10-CM | POA: Diagnosis not present

## 2017-11-05 LAB — CBC
HCT: 38.4 % — ABNORMAL LOW (ref 39.0–52.0)
HEMOGLOBIN: 12.6 g/dL — AB (ref 13.0–17.0)
MCH: 28 pg (ref 26.0–34.0)
MCHC: 32.8 g/dL (ref 30.0–36.0)
MCV: 85.3 fL (ref 78.0–100.0)
Platelets: 135 10*3/uL — ABNORMAL LOW (ref 150–400)
RBC: 4.5 MIL/uL (ref 4.22–5.81)
RDW: 15.1 % (ref 11.5–15.5)
WBC: 5.5 10*3/uL (ref 4.0–10.5)

## 2017-11-05 LAB — BASIC METABOLIC PANEL
ANION GAP: 9 (ref 5–15)
BUN: 9 mg/dL (ref 6–20)
CALCIUM: 8.6 mg/dL — AB (ref 8.9–10.3)
CHLORIDE: 108 mmol/L (ref 101–111)
CO2: 24 mmol/L (ref 22–32)
Creatinine, Ser: 0.93 mg/dL (ref 0.61–1.24)
GFR calc Af Amer: >60 mL/min (ref >60)
GFR calc non Af Amer: >60 mL/min (ref >60)
Glucose, Bld: 91 mg/dL (ref 65–99)
Potassium: 3.6 mmol/L (ref 3.5–5.1)
SODIUM: 141 mmol/L (ref 135–145)

## 2017-11-05 MED ORDER — ASPIRIN 81 MG PO CHEW: 81.0000 mg | CHEWABLE_TABLET | Freq: Every day | ORAL | Status: DC

## 2017-11-05 MED ORDER — NITROGLYCERIN 0.4 MG SL SUBL: 0.4000 mg | SUBLINGUAL_TABLET | SUBLINGUAL | Status: DC | PRN

## 2017-11-05 MED ORDER — ASPIRIN 81 MG PO CHEW: 81.0000 mg | CHEWABLE_TABLET | Freq: Every day | ORAL | 3 refills | Status: DC

## 2017-11-05 MED ORDER — CLOPIDOGREL BISULFATE 75 MG PO TABS: 75.0000 mg | ORAL_TABLET | Freq: Every day | ORAL | 3 refills | Status: DC

## 2017-11-05 MED ORDER — NITROGLYCERIN 0.4 MG SL SUBL: 0.4000 mg | SUBLINGUAL_TABLET | SUBLINGUAL | 0 refills | Status: DC | PRN

## 2017-11-05 MED FILL — Heparin Sodium (Porcine) 2 Unit/ML in Sodium Chloride 0.9%: INTRAMUSCULAR | Qty: 1000 | Status: AC

## 2017-11-05 NOTE — NC FL2 (Addendum)
[place] MEDICAID FL2 LEVEL OF CARE SCREENING TOOL     IDENTIFICATION  Patient Name: Samuel Vega Birthdate: Jan 13, 1924 Sex: male Admission Date (Current Location): 11/04/2017  [place] Number:  Haynes Bast [id number] Facility and Address:  [facility], [address] [place], [place] [address]      Provider Number: [id number]  Attending Physician Name and Address:  Kathleene Hazel,*  Relative Name and Phone Number:  Zyan Coby (son) [phone]    Current Level of Care: SNF Recommended Level of Care: Skilled Nursing Facility Prior Approval Number:    Date Approved/Denied:   PASRR Number:    Discharge Plan: SNF    Current Diagnoses: Patient Active Problem List   Diagnosis Date Noted  . HTN (hypertension) 11/05/2017  . S/P right coronary artery (RCA) stent placement 11/05/2017  . Unstable angina (HCC) 11/04/2017  . Coronary artery disease involving native coronary artery of native heart with unstable angina pectoris (HCC)   . Severe aortic stenosis   . Seizure (HCC) 10/26/2017  . Dementia without behavioral disturbance   . Leukocytosis   . Acute blood loss anemia   . Acute CVA (cerebrovascular accident) (HCC) 10/09/2017  . Elevated troponin 10/09/2017  . Rhabdomyolysis 10/09/2017  . Fall   . Syncope   . Carotid stenosis, right   . Cerebrovascular accident (CVA) due to embolism of right anterior cerebral artery (HCC)   . Loss of weight 03/20/2014  . Chronic back pain   . Hyperlipidemia 07/29/2012  . Hearing loss 07/29/2012  . Macular degeneration   . Tinnitus   . ED (erectile dysfunction)   . PTSD (post-traumatic stress disorder)   . Aortic stenosis, severe 09/11/2009    Orientation RESPIRATION BLADDER Height & Weight     Self, Time, Situation, Place  Normal External catheter Weight: 171 lb 15.3 oz (78 kg) Height:  5\' 8"  (172.7 cm)  BEHAVIORAL SYMPTOMS/MOOD NEUROLOGICAL BOWEL NUTRITION STATUS       Continent Diet(Heart healthy  )  AMBULATORY STATUS COMMUNICATION OF NEEDS Skin   Limited Assist Verbally Normal                       Personal Care Assistance Level of Assistance  Bathing, Feeding, Dressing Bathing Assistance: Limited assistance Feeding assistance: Independent Dressing Assistance: Limited assistance     Functional Limitations Info  Sight, Hearing, Speech Sight Info: Impaired (wears glasses, 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              Contractures Contractures Info: Not present    Additional Factors Info  Code Status, Allergies Code Status Info: Full code  Allergies Info: Statins            Current Medications (11/05/2017):  This is the current hospital active medication list Current Facility-Administered Medications  Medication Dose Route Frequency Provider Last Rate Last Dose  . 0.9 %  sodium chloride infusion  250 mL Intravenous PRN Kathleene Hazel, MD      . acetaminophen (TYLENOL) tablet 650 mg  650 mg Oral Q4H PRN Kathleene Hazel, MD      . aspirin chewable tablet 81 mg  81 mg Oral Daily Kathleene Hazel, MD      .  clopidogrel (PLAVIX) tablet 75 mg  75 mg Oral Q breakfast Kathleene HazelMcAlhany, Christopher D, MD      . levETIRAcetam (KEPPRA) tablet 500 mg  500 mg Oral BID Kathleene HazelMcAlhany, Christopher D, MD   500 mg at 11/04/17 2149  . nitroGLYCERIN (NITROSTAT) SL tablet 0.4 mg  0.4 mg Sublingual Q5 min PRN Georgie ChardMcDaniel, Jill D, NP      . ondansetron [facility](ZOFRAN) injection 4 mg  4 mg Intravenous Q6H PRN Kathleene HazelMcAlhany, Christopher D, MD      . sodium chloride flush (NS) 0.9 % injection 3 mL  3 mL Intravenous Q12H Kathleene HazelMcAlhany, Christopher D, MD   3 mL at 11/04/17 2149  . sodium chloride flush (NS) 0.9 % injection 3 mL  3 mL Intravenous PRN  Kathleene HazelMcAlhany, Christopher D, MD         Discharge Medications: Please see discharge summary for a list of discharge medications.  Relevant Imaging Results:  Relevant Lab Results:   Additional Information ssn#[id number]  Maxwell Marionamara T Liza Czerwinski, Student-Social Work [phone]

## 2017-11-05 NOTE — Progress Notes (Signed)
CARDIAC REHAB PHASE I   PRE:  Rate/Rhythm: 74 SR with PAC    BP: sitting 159/74    SaO2: 94 RA  MODE:  Ambulation: 100 ft   POST:  Rate/Rhythm: 94 SR with PAC    BP: sitting 138/106, 145/90     SaO2: 100 RA  Pt is somewhat difficult to communicate with due to his HOH and talkative nature. He was able to move to EOB and sit up independently. Also stood independently. I supported with gait belt and he used RW to walk to sink, brush teeth, then walk in hall. He periodically got his left foot outside of RW and at times struggled with sequence processing. No CP or c/o. To recliner on chair alarm. Discussed importance of backing in to recliner with RW instead of reaching for it. He was able to later voice that he learned this today. I reinforced Plavix/ASA. N/a for CRPII right now due to need for TAVR. Encouraged him to keep working with PT at rehab. [phone]  Harriet MassonRandi Kristan Anoop Hemmer CES, ACSM 11/05/2017 9:33 AM

## 2017-11-05 NOTE — Clinical Social Work Note (Signed)
CSW received call from Tammie with [facility] (3:55 pm). Request for authorization for continued rehab denied. When asked reason for denial, Tammie explained that per medical director, patient appeared to have regressed physically and cognitively and has something happened to cause this regression. The attending MD can request a peer-to-peer and contact Dr. Lovena LeSteven Evans at [phone]. This information was provided to patient's nurse who will contact the attending MD.  CSW also contacted son Jolayne PantherRonnie Kracht [phone]) to inform him of insurance denial. Son was upset with denial and CSW attempted to answer his questions and explained the process regarding the attending requesting a peer-to-peer if they choose to. Son was advised that he will be kept informed.  CSW will continue to follow and awaiting information from bedside nurse regarding attending MD's decision of doing the peer-to-peer with [employer] engineer[employer]urance medical director.  Genelle BalVanessa Fremon Zacharia, MSW, LCSW Licensed Clinical Social Worker Clinical Social Work Department [facility] [phone]

## 2017-11-05 NOTE — Progress Notes (Signed)
6C unable to give report at this time.

## 2017-11-05 NOTE — Discharge Summary (Addendum)
The patient has been seen in conjunction with Georgie Chard, NPC and Vin Bhagat PAC, . All aspects of care have been considered and discussed. The patient has been personally interviewed, examined, and all clinical data has been reviewed.    Please see full discharge summary.  He was not septic back to his skilled nursing facility initially.  This caused an extra night stay in hospital.  He is back to his physical baseline, ambulating in the halls, and approved to return to skilled nursing facility.  Spoke with the medical director Dr. Logan Bores.  Radial cath site is unremarkable.  Follow-up with Dr. Clifton James for completion of workup to treat TAVR.  According to the patient, was lost to follow-up with me due to scheduling difficulty with in our office.  Last office visit with me was greater than 3-1/2 years ago.  We need to transition of care office visit in 1 week.     Discharge Summary    Patient ID: Samuel Vega,  MRN: [id number], DOB/AGE: Aug 06, 1924 84 y.o.  Admit date: 11/04/2017 Discharge date: 11/05/2017  Primary Care Provider: Porfirio Oar Primary Cardiologist: Verne Carrow, MD  Discharge Diagnoses    Active Problems:   Hyperlipidemia   PTSD (post-traumatic stress disorder)   Cerebrovascular accident (CVA) due to embolism of right anterior cerebral artery (HCC)   Seizure (HCC)   Coronary artery disease involving native coronary artery of native heart with unstable angina pectoris (HCC)   Severe aortic stenosis   Unstable angina (HCC)   HTN (hypertension)   S/P right coronary artery (RCA) stent placement  Allergies Allergies  Allergen Reactions  . Statins Other (See Comments)    MYALGIAS   Diagnostic Studies/Procedures    Cardiac catheterization 11/04/17:   Prox RCA-1 lesion is 30% stenosed.  Prox RCA-2 lesion is 95% stenosed.  Mid RCA to Dist RCA lesion is 20% stenosed.  Ost RPDA lesion is 30% stenosed.  Ost 3rd Mrg lesion is 50%  stenosed.  Prox Cx to Mid Cx lesion is 30% stenosed.  Ost LAD to Prox LAD lesion is 70% stenosed.  Prox LAD-1 lesion is 70% stenosed.  Prox LAD-2 lesion is 70% stenosed.  Mid LAD lesion is 50% stenosed.  Mid LAD to Dist LAD lesion is 50% stenosed.  A drug-eluting stent was successfully placed using a STENT SYNERGY DES 2.75X20.  Post intervention, there is a 0% residual stenosis.   1. Triple vessel CAD 2. Moderately severe, calcified stenosis in the proximal and mid LAD 3. Moderate disease in the Circumflex and first OM branch 4. The RCA is large dominant vessel with severe, heavily calcified stenosis in the mid vessel.  5. Successful PTCA/DES in the mid RCA 6. Severe aortic stenosis (mean gradient 59.5 mmHg, peak gradient 71 mmHg, AVA 0.8 cm2)  Recommendations: Will continue DAPT with ASA and Plavix. He is statin intolerant. Consider Praluent/Repatha. Will monitor overnight. Will continue planning for TAVR. Will plan medical therapy of his LAD.   History of Present Illness      Mr. Samuel Vega is a 84yo M with a history of severe aortic stenosis, HLD, HTN and recent CVA who was last seen by Dr. Clifton James 10/19/17 as a new consult for evaluation of severe aortic stenosis, referred by Dr. Katrinka Blazing. He was noted to have severe aortic stenosis in September 2015 but had no symptoms at that time. He has had no repeat imaging since then. He was admitted to [facility] 10/09/17 with an acute stroke. He was  found to have high grade stenosis of his right internal carotid artery. Echo 10/10/17 with normal LV systolic function. His aortic valve is heavily thickened and calcified with severe stenosis. Mean gradient 71 mmHg, DVI 0.13. He was seen in the hospital by vascular surgery and will need a right carotid endarterectomy. At the time of his appointment, he had c/o occasional chest pain, dizziness and ongoing fatigue however, no dyspnea or lower extremity edema.  [facility]. He can now use his left arm. He is getting more use from his left leg. He had lived alone before his stroke.   TAVR plan: TAVR workup secondary to severe, stage D aortic stenosis. Arrangements were made for a right and left heart [facility] at [facility] 11[facility]04[facility]. This will allow one month to recover from his CVA. After the cath, he will have a cardiac CT, CTA of the chest/abdomen and pelvis, PFTs and will then be referred to see one of the CT surgeons on our TAVR team. We will begin his workup for TAVR now. He will need one month to recover from his CVA.   Hospital Course   [facility] Unfortunately, he was found to have triple-vessel CAD with moderately severe, calcified stenosis in the proximal and mid LAD, moderate disease in the circumflex and first OM branch. The RCA is large dominant vessel with severe, heavily calcified stenosis in the mid vessel. A successful PTCA/DES in the mid RCA was placed. There is severe aortic stenosis (mean gradient 59.5 mmHg, peak gradient 71 mmHg, AVA 0.8 cm2).  Recommendations have been made to continue DAPT with ASA and Plavix. He is statin intolerant. Would consider Praluent/Rapatha. Plan is to proceed with TAVR workup with medical management fo his LAD disease.   I will set him up for TOC follow up in the office secondary to DES placement. He has a TCTS surgery consultation appointment on 11/23/17 with Dr. Cornelius Moraswen. His CT chest and Cardiac CT are scheduled for 11/16/17.   Consultants: None  General: Elderly, well developed, well nourished, NAD Skin: Warm, dry, intact  Head: Normocephalic, atraumatic, clear, moist mucus membranes. Neck: Negative for carotid bruits. No JVD Lungs:Clear to ausculation bilaterally. No wheezes, rales, or rhonchi. Breathing is unlabored. Cardiovascular: RRR with S1 S2. + AS murmur, rubs, or gallops Abdomen: Soft, non-tender, non-distended with  normoactive bowel sounds.No obvious abdominal masses. MSK: Strength and tone appear normal for age. 5/5 in all extremities Extremities: No edema. Right radial cath site unremarkable. No clubbing or cyanosis. DP/PT pulses 2+ bilaterally Neuro: Alert and oriented. No focal deficits. No facial asymmetry. MAE spontaneously. Psych: Responds to questions appropriately with normal affect.    The patient has been seen and examined by Dr. Katrinka BlazingSmith who feels that he is stable and ready for discharge today 11/05/17. He will need to ambulate prior to discharge.  _____________  Discharge Vitals Blood pressure (!) 159/74, pulse 73, temperature 97.8 F (36.6 C), temperature source Oral, resp. rate 14, height 5\' 8"  (1.727 m), weight 171 lb 15.3 oz (78 kg), SpO2 100 %.  Filed Weights   11/04/17 0630 11/05/17 0240  Weight: 171 lb (77.6 kg) 171 lb 15.3 oz (78 kg)    Labs & Radiologic Studies    CBC Recent Labs    11/02/17 1106 11/05/17 0229  WBC 5.8 5.5  HGB 12.3* 12.6*  HCT 38.0 38.4*  MCV 84 85.3  PLT 180 135*  Basic Metabolic Panel Recent Labs    47/82/95 1106 11/05/17 0229  NA 143 141  K 4.1 3.6  CL 105 108  CO2 25 24  GLUCOSE 88 91  BUN 12 9  CREATININE 1.08 0.93  CALCIUM 9.3 8.6*   _____________  Ct Angio Head W Or Wo Contrast  Result Date: 10/09/2017 CLINICAL DATA:  Stroke workup.  Patient found on floor. EXAM: CT ANGIOGRAPHY HEAD AND NECK TECHNIQUE: Multidetector CT imaging of the head and neck was performed using the standard protocol during bolus administration of intravenous contrast. Multiplanar CT image reconstructions and MIPs were obtained to evaluate the vascular anatomy. Carotid stenosis measurements (when applicable) are obtained utilizing NASCET criteria, using the distal internal carotid diameter as the denominator. CONTRAST:  50mL ISOVUE-370 IOPAMIDOL (ISOVUE-370) INJECTION 76% COMPARISON:  Brain MRI from earlier today. FINDINGS: CT HEAD FINDINGS Brain: Right parietal  low-density as characterized by MRI earlier today. No evidence of progression. No hemorrhage or hydrocephalus. Brain volume is excellent for age. There is a small right frontal hygroma without significant mass effect. Vascular: See below Skull: Questionable scalp swelling along the left parietal bone posteriorly. Negative for fracture. Sinuses: No acute finding.  Anterior nasal septum perforation. Orbits: Bilateral cataract resection. Review of the MIP images confirms the above findings CTA NECK FINDINGS Aortic arch: Mild atheromatous plaque.  Three vessel branching. Right carotid system: Advanced calcified and noncalcified plaque at the common carotid bifurcation and ICA bulb with 70-80% stenosis. No dissection or beading. Left carotid system: Moderate calcified and noncalcified plaque at the common carotid bifurcation without ulceration or flow limiting stenosis. Vertebral arteries: Proximal subclavian mild atherosclerosis. Right dominant vertebral artery. There is mild narrowing at the left vertebral origin. No beading or dissection. Skeleton: Diffuse cervical disc degeneration with narrowing reversal of lordosis. There is upper cervical facet arthropathy with C2-3 anterolisthesis. Other neck:  No acute or aggressive finding. Upper chest: Negative Review of the MIP images confirms the above findings CTA HEAD FINDINGS Anterior circulation: Atherosclerotic plaque on the carotid siphons without flow limiting stenosis. The left ICA is smaller than the right in the setting of hypoplastic left A1 segment. No visible branch occlusion or flow limiting stenosis. Negative for aneurysm. On MIP images there is prominent vessels about the right parietal convexity. Posterior circulation: Vertebrobasilar arteries are smooth and diffusely patent. Moderate atheromatous narrowing of the distal left P2 segment. Venous sinuses: Patent Anatomic variants: None significant Delayed phase: Mild cortical enhancement at the level of right  parietal abnormality which is nonspecific but would be supportive of a subacute infarct. Review of the MIP images confirms the above findings IMPRESSION: 1. 70-80% atheromatous narrowing at the right ICA bulb. 2. No other significant stenosis in the neck. 3. Intracranial atherosclerosis without proximal or correctable stenosis. There is moderate atheromatous narrowing of the distal left P2 segment. 4. Presumed infarct in the right parietal lobe (see brain MRI report). Mild associated enhancement would correlate with subacute timing. Electronically Signed   By: Marnee Spring M.D.   On: 10/09/2017 10:59   Dg Chest 2 View  Result Date: 10/08/2017 CLINICAL DATA:  Fall EXAM: CHEST  2 VIEW COMPARISON:  None. FINDINGS: Mild cardiomegaly. Bibasilar atelectasis. No effusions. No edema. No acute bony abnormality. IMPRESSION: Mild cardiomegaly.  Bibasilar atelectasis. Electronically Signed   By: Charlett Nose M.D.   On: 10/08/2017 23:17   Dg Pelvis 1-2 Views  Result Date: 10/09/2017 CLINICAL DATA:  84 year old male with fall and left hip pain. EXAM: PELVIS - 1-2  VIEW COMPARISON:  None. FINDINGS: There is no acute fracture or dislocation. The bones are osteopenic. Mild arthritic changes. There is degenerative changes of the visualized lower lumbar spine. The soft tissues appear unremarkable. Vascular calcification noted. IMPRESSION: No acute fracture or dislocation. Electronically Signed   By: Elgie Collard M.D.   On: 10/09/2017 05:33   Dg Elbow 2 Views Left  Result Date: 10/09/2017 CLINICAL DATA:  84 year old male with fall and left elbow pain. EXAM: LEFT ELBOW - 2 VIEW COMPARISON:  None. FINDINGS: There is no acute fracture or dislocation. Mild osteopenia. No arthritic changes. The soft tissues appear unremarkable. No joint effusion. IMPRESSION: Negative. Electronically Signed   By: Elgie Collard M.D.   On: 10/09/2017 05:34   Ct Head Wo Contrast  Result Date: 10/26/2017 CLINICAL DATA:  Stroke 10/08/2016.  Left-sided tremors today. History of dementia. EXAM: CT HEAD WITHOUT CONTRAST TECHNIQUE: Contiguous axial images were obtained from the base of the skull through the vertex without intravenous contrast. COMPARISON:  10/09/2017 an MRI 10/09/2017 FINDINGS: Brain: Ventricles, cisterns and other CSF spaces within normal. There is evidence of patient's known subacute posterior right parietal infarct without significant change in size or distribution compared to recent MRI. Evidence of minimal chronic ischemic microvascular disease. No mass, significant mass effect or midline shift. No evidence of acute hemorrhage. Vascular: No hyperdense vessel or unexpected calcification. Skull: Normal. Negative for fracture or focal lesion. Sinuses/Orbits: No acute finding. Other: None. IMPRESSION: Evidence of patient's known subacute infarct over the right posterior parietal region without significant change. No new areas of infarction and no acute hemorrhage. Mild chronic ischemic microvascular disease. Electronically Signed   By: Elberta Fortis M.D.   On: 10/26/2017 07:22   Ct Head Wo Contrast  Result Date: 10/08/2017 CLINICAL DATA:  Found on floor.  Bruising to left side of face EXAM: CT HEAD WITHOUT CONTRAST CT CERVICAL SPINE WITHOUT CONTRAST TECHNIQUE: Multidetector CT imaging of the head and cervical spine was performed following the standard protocol without intravenous contrast. Multiplanar CT image reconstructions of the cervical spine were also generated. COMPARISON:  None. FINDINGS: CT HEAD FINDINGS Brain: Area of low-density noted in the posteromedial right parietal lobe concerning for acute infarct. There is atrophy and chronic small vessel disease changes. No hemorrhage or hydrocephalus. Vascular: No hyperdense vessel or unexpected calcification. Skull: No acute calvarial abnormality. Sinuses/Orbits: No acute findings Other: None CT CERVICAL SPINE FINDINGS Alignment: No subluxation. Skull base and vertebrae: No  fracture Soft tissues and spinal canal: No prevertebral fluid or swelling. No visible canal hematoma. Disc levels:  Diffuse degenerative disc and facet disease. Upper chest: No acute findings Other: No acute findings IMPRESSION: Subtle low-density in the posteromedial high right parietal lobe concerning for acute infarction. No hemorrhage. Diffuse degenerative disc and facet disease in the cervical spine. No acute bony abnormality. Electronically Signed   By: Charlett Nose M.D.   On: 10/08/2017 23:33   Ct Angio Neck W Or Wo Contrast  Result Date: 10/09/2017 CLINICAL DATA:  Stroke workup.  Patient found on floor. EXAM: CT ANGIOGRAPHY HEAD AND NECK TECHNIQUE: Multidetector CT imaging of the head and neck was performed using the standard protocol during bolus administration of intravenous contrast. Multiplanar CT image reconstructions and MIPs were obtained to evaluate the vascular anatomy. Carotid stenosis measurements (when applicable) are obtained utilizing NASCET criteria, using the distal internal carotid diameter as the denominator. CONTRAST:  50mL ISOVUE-370 IOPAMIDOL (ISOVUE-370) INJECTION 76% COMPARISON:  Brain MRI from earlier today. FINDINGS: CT HEAD FINDINGS  Brain: Right parietal low-density as characterized by MRI earlier today. No evidence of progression. No hemorrhage or hydrocephalus. Brain volume is excellent for age. There is a small right frontal hygroma without significant mass effect. Vascular: See below Skull: Questionable scalp swelling along the left parietal bone posteriorly. Negative for fracture. Sinuses: No acute finding.  Anterior nasal septum perforation. Orbits: Bilateral cataract resection. Review of the MIP images confirms the above findings CTA NECK FINDINGS Aortic arch: Mild atheromatous plaque.  Three vessel branching. Right carotid system: Advanced calcified and noncalcified plaque at the common carotid bifurcation and ICA bulb with 70-80% stenosis. No dissection or beading. Left  carotid system: Moderate calcified and noncalcified plaque at the common carotid bifurcation without ulceration or flow limiting stenosis. Vertebral arteries: Proximal subclavian mild atherosclerosis. Right dominant vertebral artery. There is mild narrowing at the left vertebral origin. No beading or dissection. Skeleton: Diffuse cervical disc degeneration with narrowing reversal of lordosis. There is upper cervical facet arthropathy with C2-3 anterolisthesis. Other neck:  No acute or aggressive finding. Upper chest: Negative Review of the MIP images confirms the above findings CTA HEAD FINDINGS Anterior circulation: Atherosclerotic plaque on the carotid siphons without flow limiting stenosis. The left ICA is smaller than the right in the setting of hypoplastic left A1 segment. No visible branch occlusion or flow limiting stenosis. Negative for aneurysm. On MIP images there is prominent vessels about the right parietal convexity. Posterior circulation: Vertebrobasilar arteries are smooth and diffusely patent. Moderate atheromatous narrowing of the distal left P2 segment. Venous sinuses: Patent Anatomic variants: None significant Delayed phase: Mild cortical enhancement at the level of right parietal abnormality which is nonspecific but would be supportive of a subacute infarct. Review of the MIP images confirms the above findings IMPRESSION: 1. 70-80% atheromatous narrowing at the right ICA bulb. 2. No other significant stenosis in the neck. 3. Intracranial atherosclerosis without proximal or correctable stenosis. There is moderate atheromatous narrowing of the distal left P2 segment. 4. Presumed infarct in the right parietal lobe (see brain MRI report). Mild associated enhancement would correlate with subacute timing. Electronically Signed   By: Marnee Spring M.D.   On: 10/09/2017 10:59   Ct Cervical Spine Wo Contrast  Result Date: 10/08/2017 CLINICAL DATA:  Found on floor.  Bruising to left side of face  EXAM: CT HEAD WITHOUT CONTRAST CT CERVICAL SPINE WITHOUT CONTRAST TECHNIQUE: Multidetector CT imaging of the head and cervical spine was performed following the standard protocol without intravenous contrast. Multiplanar CT image reconstructions of the cervical spine were also generated. COMPARISON:  None. FINDINGS: CT HEAD FINDINGS Brain: Area of low-density noted in the posteromedial right parietal lobe concerning for acute infarct. There is atrophy and chronic small vessel disease changes. No hemorrhage or hydrocephalus. Vascular: No hyperdense vessel or unexpected calcification. Skull: No acute calvarial abnormality. Sinuses/Orbits: No acute findings Other: None CT CERVICAL SPINE FINDINGS Alignment: No subluxation. Skull base and vertebrae: No fracture Soft tissues and spinal canal: No prevertebral fluid or swelling. No visible canal hematoma. Disc levels:  Diffuse degenerative disc and facet disease. Upper chest: No acute findings Other: No acute findings IMPRESSION: Subtle low-density in the posteromedial high right parietal lobe concerning for acute infarction. No hemorrhage. Diffuse degenerative disc and facet disease in the cervical spine. No acute bony abnormality. Electronically Signed   By: Charlett Nose M.D.   On: 10/08/2017 23:33   Mr Brain Wo Contrast  Result Date: 10/26/2017 CLINICAL DATA:  Continued surveillance, recent stroke.Abdominal spasms. EXAM: MRI HEAD  WITHOUT CONTRAST TECHNIQUE: Multiplanar, multiecho pulse sequences of the brain and surrounding structures were obtained without intravenous contrast. COMPARISON:  MRI brain 10/09/2017.  CT head 10/26/2017. FINDINGS: Brain: Subacute infarction redemonstrated in the RIGHT parietal lobe, central encephalomalacia, T2 and FLAIR hyperintensity, normalizing ADC with only mild restricted diffusion. Developing encephalomalacia centrally. No new areas of infarction. There is a small subacute subdural hematoma, RIGHT occipital subdural space, maximum  3 mm thickness, reference image 16 series 7, which in retrospect was present on the previous exam. There is no mass effect on the underlying brain. No midline shift. There was also a RIGHT subdural hygroma, which is essentially normalized on today's exam. Atrophy and small vessel disease are stable. Vascular: Flow voids are maintained. Skull and upper cervical spine: Normal marrow signal. Sinuses/Orbits: Negative. Other: None. IMPRESSION: Typical evolutionary change for subacute RIGHT parietal infarction, without new areas of cerebral infarction. RIGHT subdural hygroma has essentially resolved, but there is residual subacute RIGHT 3 mm thick subdural hematoma, also present previously, and unchanged. This is a nonsurgical lesion. Atrophy and small vessel disease, not unexpected for age. Electronically Signed   By: Elsie Stain M.D.   On: 10/26/2017 09:31   Mr Brain Wo Contrast  Result Date: 10/09/2017 CLINICAL DATA:  84 y/o  M; possible syncope. EXAM: MRI HEAD WITHOUT CONTRAST TECHNIQUE: Multiplanar, multiecho pulse sequences of the brain and surrounding structures were obtained without intravenous contrast. COMPARISON:  10/08/2017 CT head. FINDINGS: Brain: 3.8 x 3.9 x 4.6 cm (volume = 36 cm^3) region of predominantly cortical diffusion hyperintensity which demonstrates both areas of reduced, intermediate, and increased diffusion on ADC (see series 3, image 38 and series 350, image 38). The region is associated with increased T2 FLAIR hyperintense signal abnormality and minimal gyral swelling. No focus of reduced diffusion to suggest acute or early subacute infarction. No abnormal susceptibility hypointensity to indicate intracranial hemorrhage. Severalnonspecific foci of T2 FLAIR hyperintense signal abnormality in subcortical and periventricular white matter are compatible withmildchronic microvascular ischemic changes for age. Mildbrain parenchymal volume loss. There is displacement of cortical veins away from  the calvarium over the right cerebral cortex with CSF signal (series 10, image 15 and series 6, image 16) which may represent a small hygroma. No midline shift. Vascular: Normal flow voids. Skull and upper cervical spine: Normal marrow signal. Sinuses/Orbits: Negative. Other: None. IMPRESSION: 1. Right superior and medial parietal lobe 36 cc area of predominantly cortical diffusion signal abnormality demonstrating areas of both reduced and increased diffusion. Findings probably represents a subacute infarction, but cerebritis or seizure activity should also be considered. No hemorrhage or mass effect. 2. Mild chronic microvascular ischemic changes and mild parenchymal volume loss of the brain. 3. Displacement of cortical veins over the right cerebral convexity from the calvarium, possibly small hygroma. No significant mass effect on the brain. Electronically Signed   By: Mitzi Hansen M.D.   On: 10/09/2017 04:03   Dg Hand Complete Left  Result Date: 10/08/2017 CLINICAL DATA:  Fall.  Right hand pain EXAM: LEFT HAND - COMPLETE 3+ VIEW COMPARISON:  None. FINDINGS: No acute bony abnormality. Specifically, no fracture, subluxation, or dislocation. IMPRESSION: No acute bony abnormality. Electronically Signed   By: Charlett Nose M.D.   On: 10/08/2017 23:21   Mr Maxine Glenn Head Wo Contrast  Result Date: 10/09/2017 CLINICAL DATA:  84 y/o  M; evaluation for stroke. EXAM: MRA HEAD WITHOUT CONTRAST TECHNIQUE: Angiographic images of the Circle of Willis were obtained using MRA technique without intravenous contrast. COMPARISON:  10/09/2017 MRI head. FINDINGS: Internal carotid arteries: Patent. Mild lumen irregularity without significant stenosis compatible with atherosclerosis. Anterior cerebral arteries:  Patent. Middle cerebral arteries: Patent. Anterior communicating artery: Patent. Posterior communicating arteries: Patent. Left P2 short segment of moderate stenosis. Posterior cerebral arteries:  Patent. Basilar  artery:  Patent. Vertebral arteries:  Patent. No aneurysm or large vessel occlusion identified. Right ACA ACA distributions demonstrate increased flow related signal, probably hyperemia. IMPRESSION: Patent anterior and posterior intracranial circulation. No large vessel occlusion or aneurysm. Increase flow related signal within right ACA and MCA distributions, probably hyperemia given right parietal abnormality. Electronically Signed   By: Mitzi Hansen M.D.   On: 10/09/2017 06:11   Dg Femur Min 2 Views Left  Result Date: 10/09/2017 CLINICAL DATA:  84 year old male with fall and left hip pain. EXAM: LEFT FEMUR 2 VIEWS COMPARISON:  Pelvic radiograph dated 10/09/2017 FINDINGS: There is no acute fracture or dislocation. Mild osteopenia. No significant arthritic changes. Vascular calcifications noted. The soft tissues appear unremarkable. IMPRESSION: Negative. Electronically Signed   By: Elgie Collard M.D.   On: 10/09/2017 05:36   Disposition   Pt is being discharged home today in good condition.  Follow-up Plans & Appointments    Follow-up Information    Dyann Kief, PA-C Follow up on 11/18/2017.   Specialty:  Cardiology Why:  Your appointment will be on 11/18/17 at 1200pm with Jacolyn Reedy, PA with Dr. Clifton James. Please arrive to your appointment at 1145am. Thank you.  Contact information: [address] [place] [address] [phone]          Discharge Instructions    Call MD for:  difficulty breathing, headache or visual disturbances   Complete by:  As directed    Call MD for:  redness, tenderness, or signs of infection (pain, swelling, redness, odor or green/yellow discharge around incision site)   Complete by:  As directed    Call MD for:  severe uncontrolled pain   Complete by:  As directed    Diet - low sodium heart healthy   Complete by:  As directed    Discharge instructions   Complete by:  As directed    No driving for 3 days. No lifting  over 5 lbs for 1 week. No sexual activity for 1 week. Keep procedure site clean & dry. If you notice increased pain, swelling, bleeding or pus, call/return!  You may shower, but no soaking baths/hot tubs/pools for 1 week.   PLEASE DO NOT MISS ANY DOSES OF YOUR PLAVIX!!!! Also keep a log of you blood pressures and bring back to your follow up appt. Please call the office with any questions.   Patients taking blood thinners should generally stay away from medicines like ibuprofen, Advil, Motrin, naproxen, and Aleve due to risk of stomach bleeding. You may take Tylenol as directed or talk to your primary doctor about alternatives. Some studies suggest Prilosec/Omeprazole interacts with Plavix. You may use Protonix if your are using one of these medications, as Protonix does not have the same interaction.   Increase activity slowly   Complete by:  As directed      Discharge Medications   Allergies as of 11/05/2017      Reactions   Statins Other (See Comments)   MYALGIAS      Medication List    STOP taking these medications   aspirin 325 MG tablet Replaced by:  aspirin 81 MG chewable tablet     TAKE these medications  acetaminophen 500 MG tablet Commonly known as:  TYLENOL Take 1,000 mg by mouth every 6 (six) hours as needed for mild pain.   aspirin 81 MG chewable tablet Chew 1 tablet (81 mg total) by mouth daily. Replaces:  aspirin 325 MG tablet   clopidogrel 75 MG tablet Commonly known as:  PLAVIX Take 1 tablet (75 mg total) by mouth daily with breakfast.   docusate sodium 100 MG capsule Commonly known as:  COLACE Take 1 capsule (100 mg total) by mouth daily as needed for mild constipation.   levETIRAcetam 500 MG tablet Commonly known as:  KEPPRA Take 1 tablet (500 mg total) by mouth 2 (two) times daily.   traZODone 50 MG tablet Commonly known as:  DESYREL Take 50 mg by mouth at bedtime as needed for sleep.       Outstanding Labs/Studies   -Lipid panel from  10/09/17 with CHO-235, HDL-96, LDL-122, Trig-87.   -Would consider referral to lipid clinic for Praluent/Rapatha condisderation. Would need follow up labs if this is the case.    Duration of Discharge Encounter   Greater than 30 minutes including physician time.  SignedGeorgie Chard NP 11/05/2017, 9:07 AM

## 2017-11-05 NOTE — Progress Notes (Signed)
2nd call to [facility] for report. No answer. Will attempt to call again.

## 2017-11-05 NOTE — Telephone Encounter (Addendum)
Patient presently admitted, will follow up next few days for TCM call.

## 2017-11-05 NOTE — Telephone Encounter (Signed)
New Message  [facility] 11/18/17 at 12:00pm with Samuel Vega per Noreene LarssonJill at hospital

## 2017-11-05 NOTE — Progress Notes (Signed)
OT Cancellation Note  Patient Details Name: Samuel Vega MRN: [id number] DOB: 06/16/1924   Cancelled Treatment:    Reason Eval/Treat Not Completed: Pt with d/c orders to return to SNF. Will defer OT to SNF.  Samuel Vega, Samuel Vega 11/05/2017, 12:09 PM  11/05/2017 Samuel Vega, OTR/L Pager: [phone]

## 2017-11-05 NOTE — Progress Notes (Signed)
The patient has been seen in conjunction with Georgie ChardJill McDaniel, [facility]. All aspects of care have been considered and discussed. The patient has been personally interviewed, examined, and all clinical data has been reviewed.   Please see full discharge summary.  Radial cath site is unremarkable.  Follow-up with Dr. Clifton JamesMcAlhany for completion of workup to treat TAVR.  According to the patient, was lost to follow-up with me due to scheduling difficulty with in our office.  Last office visit with me was greater than 3-1/2 years ago.  We need to transition of care office visit in 1 week.

## 2017-11-06 ENCOUNTER — Ambulatory Visit: Payer: Self-pay | Admitting: *Deleted

## 2017-11-06 DIAGNOSIS — R253 Fasciculation: Secondary | ICD-10-CM | POA: Diagnosis not present

## 2017-11-06 DIAGNOSIS — E782 Mixed hyperlipidemia: Secondary | ICD-10-CM | POA: Diagnosis not present

## 2017-11-06 DIAGNOSIS — R079 Chest pain, unspecified: Secondary | ICD-10-CM | POA: Diagnosis not present

## 2017-11-06 DIAGNOSIS — Y840 Cardiac catheterization as the cause of abnormal reaction of the patient, or of later complication, without mention of misadventure at the time of the procedure: Secondary | ICD-10-CM | POA: Diagnosis not present

## 2017-11-06 DIAGNOSIS — I2510 Atherosclerotic heart disease of native coronary artery without angina pectoris: Secondary | ICD-10-CM | POA: Diagnosis not present

## 2017-11-06 DIAGNOSIS — I63421 Cerebral infarction due to embolism of right anterior cerebral artery: Secondary | ICD-10-CM | POA: Diagnosis not present

## 2017-11-06 DIAGNOSIS — R278 Other lack of coordination: Secondary | ICD-10-CM | POA: Diagnosis not present

## 2017-11-06 DIAGNOSIS — I25110 Atherosclerotic heart disease of native coronary artery with unstable angina pectoris: Secondary | ICD-10-CM | POA: Diagnosis not present

## 2017-11-06 DIAGNOSIS — I6529 Occlusion and stenosis of unspecified carotid artery: Secondary | ICD-10-CM | POA: Diagnosis not present

## 2017-11-06 DIAGNOSIS — M6389 Disorders of muscle in diseases classified elsewhere, multiple sites: Secondary | ICD-10-CM | POA: Diagnosis not present

## 2017-11-06 DIAGNOSIS — M6281 Muscle weakness (generalized): Secondary | ICD-10-CM | POA: Diagnosis not present

## 2017-11-06 DIAGNOSIS — R1319 Other dysphagia: Secondary | ICD-10-CM | POA: Diagnosis not present

## 2017-11-06 DIAGNOSIS — I350 Nonrheumatic aortic (valve) stenosis: Secondary | ICD-10-CM | POA: Diagnosis not present

## 2017-11-06 DIAGNOSIS — I10 Essential (primary) hypertension: Secondary | ICD-10-CM | POA: Diagnosis not present

## 2017-11-06 DIAGNOSIS — I639 Cerebral infarction, unspecified: Secondary | ICD-10-CM | POA: Diagnosis not present

## 2017-11-06 DIAGNOSIS — R0609 Other forms of dyspnea: Secondary | ICD-10-CM | POA: Diagnosis not present

## 2017-11-06 DIAGNOSIS — R41841 Cognitive communication deficit: Secondary | ICD-10-CM | POA: Diagnosis not present

## 2017-11-06 DIAGNOSIS — R569 Unspecified convulsions: Secondary | ICD-10-CM | POA: Diagnosis not present

## 2017-11-06 DIAGNOSIS — R2681 Unsteadiness on feet: Secondary | ICD-10-CM | POA: Diagnosis not present

## 2017-11-06 NOTE — Progress Notes (Signed)
Instructions reviewed with patient and copy given. Called patients son to let him know instructions and medication list, avs summary was given and in with patient belongings so he could access this information. Discharge instructions reviewed with Burgess AmorAshley Whiteside when report given earlier today and copy of AVS sent with [facility] staff for the staff at [facility].

## 2017-11-06 NOTE — Progress Notes (Signed)
Progress Note  Patient Name: Samuel NorwayWilliam T Villari Date of Encounter: 11/06/2017  Primary Cardiologist: Verne Carrowhristopher McAlhany, MD   Subjective   Discharge held yesterday due to insurance denial. Needs peer review.   Inpatient Medications    Scheduled Meds: . aspirin  81 mg Oral Daily  . clopidogrel  75 mg Oral Q breakfast  . levETIRAcetam  500 mg Oral BID  . sodium chloride flush  3 mL Intravenous Q12H   Continuous Infusions: . sodium chloride     PRN Meds: sodium chloride, acetaminophen, nitroGLYCERIN, ondansetron (ZOFRAN) IV, sodium chloride flush   Vital Signs    Vitals:   11/05/17 2050 11/05/17 2051 11/05/17 2059 11/06/17 0409  BP:  (!) 174/57  (!) 158/64  Pulse:  62 60 67  Resp: 16 16 16 16   Temp:  98.1 F (36.7 C)  98 F (36.7 C)  TempSrc:  Oral  Oral  SpO2:  100% 99% 98%  Weight:    168 lb 3.4 oz (76.3 kg)  Height:        Intake/Output Summary (Last 24 hours) at 11/06/2017 0752 Last data filed at 11/06/2017 0600 Gross per 24 hour  Intake 240 ml  Output 450 ml  Net -210 ml   Filed Weights   11/04/17 0630 11/05/17 0240 11/06/17 0409  Weight: 171 lb (77.6 kg) 171 lb 15.3 oz (78 kg) 168 lb 3.4 oz (76.3 kg)    Telemetry    NSR - Personally Reviewed  ECG    N/A  Physical Exam   GEN: Elderly male in no acute distress.   Neck: No JVD Cardiac: RRR, no murmurs, rubs, or gallops.  Respiratory: Clear to auscultation bilaterally. GI: Soft, nontender, non-distended  MS: No edema; No deformity. Neuro:  Nonfocal  Psych: Normal affect   Labs    Chemistry Recent Labs  Lab 11/02/17 1106 11/05/17 0229  NA 143 141  K 4.1 3.6  CL 105 108  CO2 25 24  GLUCOSE 88 91  BUN 12 9  CREATININE 1.08 0.93  CALCIUM 9.3 8.6*  GFRNONAA 60 >60  GFRAA 69 >60  ANIONGAP  --  9     Hematology Recent Labs  Lab 11/02/17 1106 11/05/17 0229  WBC 5.8 5.5  RBC 4.53 4.50  HGB 12.3* 12.6*  HCT 38.0 38.4*  MCV 84 85.3  MCH 27.2 28.0  MCHC 32.4 32.8  RDW  16.0* 15.1  PLT 180 135*    Radiology    No results found.  Cardiac Studies   Cardiac catheterization 11/04/17:   Prox RCA-1 lesion is 30% stenosed.  Prox RCA-2 lesion is 95% stenosed.  Mid RCA to Dist RCA lesion is 20% stenosed.  Ost RPDA lesion is 30% stenosed.  Ost 3rd Mrg lesion is 50% stenosed.  Prox Cx to Mid Cx lesion is 30% stenosed.  Ost LAD to Prox LAD lesion is 70% stenosed.  Prox LAD-1 lesion is 70% stenosed.  Prox LAD-2 lesion is 70% stenosed.  Mid LAD lesion is 50% stenosed.  Mid LAD to Dist LAD lesion is 50% stenosed.  A drug-eluting stent was successfully placed using a STENT SYNERGY DES 2.75X20.  Post intervention, there is a 0% residual stenosis.  1. Triple vessel CAD 2. Moderately severe, calcified stenosis in the proximal and mid LAD 3. Moderate disease in the Circumflex and first OM branch 4. The RCA is large dominant vessel with severe, heavily calcified stenosis in the mid vessel.  5. Successful PTCA/DES in the mid RCA 6. Severe  aortic stenosis (mean gradient 59.5 mmHg, peak gradient 71 mmHg, AVA 0.8 cm2)  Recommendations: Will continue DAPT with ASA and Plavix. He is statin intolerant. Consider Praluent/Repatha. Will monitor overnight. Will continue planning for TAVR. Will plan medical therapy of his LAD.    Patient Profile     84 y.o. male with a history of severe aortic stenosis, HLD, HTN, recent CVA and high grade stenosis of his right internal carotid artery presented for [facility] for TVAR work up.    Assessment & Plan    CAD - Found to have 3 V disease s/p PTCA & DES to RCA. Medical management for LAD.  - Continue ASA and plavix.   Severe Aortic stenosis - There is severe aortic stenosis (mean gradient 59.5 mmHg, peak gradient 71 mmHg, AVA 0.8 cm2) - Outpatient TVAR workup has been scheduled.   HLD -10/09/2017: Cholesterol 235; HDL 96; LDL Cholesterol 122; Triglycerides 87; VLDL 17  -Statin intolerance. Consider outpatient  lipid clinic referral.   Social situation - The patient has bed available but needs a peer-to-peer and contact Dr. Lovena Le at [phone].   No change from yesterday's recommendations.   For questions or updates, please contact [facility] Please consult [email or web address] for contact info under Cardiology/STEMI.      Lorelei Pont, PA  11/06/2017, 7:52 AM

## 2017-11-06 NOTE — Progress Notes (Signed)
CARDIAC REHAB PHASE I   PRE:  Rate/Rhythm: 85 SR    BP: sitting up at sink    SaO2:   MODE:  Ambulation: 300 ft   POST:  Rate/Rhythm: 95 SR with PACs    BP: sitting 114/82     SaO2:   Pt doing well. Up at sink with NT on my arrival. Able to walk longer distance. Only c/o is right leg gets tired with distance. To recliner. Did better today keeping feet inside RW on turns and backing up with RW. He had remembered what we discussed yesterday.  [phone]   Samuel MassonRandi Kristan Trejan Vega CES, ACSM 11/06/2017 9:46 AM

## 2017-11-06 NOTE — Progress Notes (Signed)
Progress Note  Patient is up and ambulating, back to his pre-hospital condition.  Spoke with Dr. Jacob MooresStephen Evans.  Patient is able to return to his skilled nursing..Marland Kitchen

## 2017-11-06 NOTE — Progress Notes (Signed)
Report given to Burgess AmorAshley Whiteside [facility]. Notified Genelle BalVanessa Crawford CSW and transfer set up.

## 2017-11-06 NOTE — Clinical Social Work Note (Signed)
Mr. Samuel Vega is medically stable for discharge back to [facility]. Insurance authorization denied on 3/28, however a peer-to-peer was conducted today and insurance authorization received: [id number] for 7 days effective 3/29. SNF admissions director Marylene LandAngela contacted and informed and patient's son, [name] contacted and updated. Son informed CSW that after discharge from [facility], his [place] will be moved into [place]. Per son, patient has toured the apartments and is in agreement. Per son, [place] [facility] provide transportation, housekeeping, meals, and emergency contact tool in apartment.  Discharge clinicals transmitted to facility and patient will be transported by non-emergency ambulance. CSW signing off as no other SW intervention services needed at this time.  Genelle BalVanessa Luellen Howson, MSW, LCSW Licensed Clinical Social Worker Clinical Social Work Department [facility] [phone]

## 2017-11-06 NOTE — Progress Notes (Addendum)
Physical Therapy Treatment Patient Details Name: Samuel NorwayWilliam T Apperson MRN: [id number] DOB: 05/19/1924 Today's Date: 11/06/2017    History of Present Illness Pt is a 84 y/o male s/p heart cath. Pt presenting from SNF. PMH includes CVA with L sided weakness, PTSD, cardiac murmur, and hearing loss.     PT Comments    Pt with progression towards goals. Improved tolerance and steadiness with mobility from previous session. Able to assist with dressing prior to d/c as well. Required min to min guard for mobility tasks with RW secondary to unsteadiness. Current recommendations appropriate. Will continue to follow acutely to maximize functional mobility independence and safety.    Follow Up Recommendations  SNF;Supervision for mobility/OOB     Equipment Recommendations  None recommended by PT    Recommendations for Other Services       Precautions / Restrictions Precautions Precautions: Fall Restrictions Weight Bearing Restrictions: No    Mobility  Bed Mobility Overal bed mobility: Needs Assistance Bed Mobility: Supine to Sit;Sit to Supine     Supine to sit: Supervision Sit to supine: Supervision   General bed mobility comments: Supervision for safety.   Transfers Overall transfer level: Needs assistance Equipment used: Rolling walker (2 wheeled) Transfers: Sit to/from Stand Sit to Stand: Min guard;Min assist         General transfer comment: Performed sit<>stand X 3 for dressing tasks and prior to ambulation. Cues for safe hand placement. Pt with improved technique for maintaining precautions.   Ambulation/Gait Ambulation/Gait assistance: Min assist;Min guard Ambulation Distance (Feet): 100 Feet Assistive device: Rolling walker (2 wheeled) Gait Pattern/deviations: Step-through pattern;Decreased stride length Gait velocity: decreased Gait velocity interpretation: Below normal speed for age/gender General Gait Details: Slow, slightly unsteady gait. Verbal cues for  proximity to device and upright posture. Min to min guard for steadying assist. Pt able to recall appropriate WB on UEs.    Stairs            Wheelchair Mobility    Modified Rankin (Stroke Patients Only)       Balance Overall balance assessment: Needs assistance Sitting-balance support: No upper extremity supported;Feet supported Sitting balance-Leahy Scale: Good     Standing balance support: Bilateral upper extremity supported;During functional activity;No upper extremity supported Standing balance-Leahy Scale: Fair Standing balance comment: Able to pull up pants to get dressed prior to d/c with min guard A and no UE support.                             Cognition Arousal/Alertness: Awake/alert Behavior During Therapy: WFL for tasks assessed/performed Overall Cognitive Status: No family/caregiver present to determine baseline cognitive functioning Area of Impairment: Problem solving                             Problem Solving: Slow processing;Difficulty sequencing;Requires verbal cues General Comments: Pt with improved ability for remembering precautions. Very chatty and easily distractible during session.       Exercises      General Comments        Pertinent Vitals/Pain Faces Pain Scale: No hurt    Home Living                      Prior Function            PT Goals (current goals can now be found in the care plan section) Acute Rehab PT Goals  Patient Stated Goal: to go back to [facility]  PT Goal Formulation: With patient Time For Goal Achievement: 11/18/17 Potential to Achieve Goals: Good Progress towards PT goals: Progressing toward goals    Frequency    Min 2X/week      PT Plan Current plan remains appropriate    Co-evaluation              AM-PAC PT "6 Clicks" Daily Activity  Outcome Measure  Difficulty turning over in bed (including adjusting bedclothes, sheets and blankets)?: A Little Difficulty  moving from lying on back to sitting on the side of the bed? : A Little Difficulty sitting down on and standing up from a chair with arms (e.g., wheelchair, bedside commode, etc,.)?: Unable Help needed moving to and from a bed to chair (including a wheelchair)?: A Little Help needed walking in hospital room?: A Little Help needed climbing 3-5 steps with a railing? : A Lot 6 Click Score: 15    End of Session Equipment Utilized During Treatment: Gait belt Activity Tolerance: Patient tolerated treatment well Patient left: in bed;with call bell/phone within reach;with bed alarm set Nurse Communication: Mobility status PT Visit Diagnosis: Other abnormalities of gait and mobility (R26.89);History of falling (Z91.81);Unsteadiness on feet (R26.81);Muscle weakness (generalized) (M62.81) Hemiplegia - Right/Left: Left Hemiplegia - dominant/non-dominant: Non-dominant Hemiplegia - caused by: Cerebral infarction     Time: 1610-9604 PT Time Calculation (min) (ACUTE ONLY): 29 min  Charges:  $Gait Training: 8-22 mins $Therapeutic Activity: 8-22 mins                    G Codes:       Gladys Damme, PT, DPT  Acute Rehabilitation Services  Pager: [phone]    Lehman Prom 11/06/2017, 2:37 PM

## 2017-11-08 DIAGNOSIS — I350 Nonrheumatic aortic (valve) stenosis: Secondary | ICD-10-CM | POA: Diagnosis not present

## 2017-11-08 DIAGNOSIS — R253 Fasciculation: Secondary | ICD-10-CM | POA: Diagnosis not present

## 2017-11-08 DIAGNOSIS — I639 Cerebral infarction, unspecified: Secondary | ICD-10-CM | POA: Diagnosis not present

## 2017-11-08 DIAGNOSIS — I2510 Atherosclerotic heart disease of native coronary artery without angina pectoris: Secondary | ICD-10-CM | POA: Diagnosis not present

## 2017-11-08 DIAGNOSIS — R569 Unspecified convulsions: Secondary | ICD-10-CM | POA: Diagnosis not present

## 2017-11-09 ENCOUNTER — Other Ambulatory Visit: Payer: Self-pay | Admitting: *Deleted

## 2017-11-09 NOTE — Telephone Encounter (Signed)
The pt was discharged to [facility] so no TCM Call is needed.

## 2017-11-10 NOTE — Patient Outreach (Signed)
[facility]) Care Management  11/10/2017  Samuel Vega Oct 31, 1923 [id number]   CSW met with patient at [facility] SNF today. CSW introduced self, role and reason for SNF visit.  He reports he is doing well with therapy and is hopeful to get released soon. He reports something about an apartment he has rented to stay in if needed (uncertain if this is in an ALF or retirement community) and suggested I follow up with his family-  CSW will make contact and advise.     Eduard Clos, MSW, [place] Worker  [place] [phone]

## 2017-11-16 ENCOUNTER — Ambulatory Visit (HOSPITAL_COMMUNITY)
Admit: 2017-11-16 | Discharge: 2017-11-16 | Disposition: A | Discharge status: Home / Self Care | Payer: PPO | Source: Ambulatory Visit | Attending: Cardiovascular Disease | Admitting: Cardiovascular Disease

## 2017-11-16 ENCOUNTER — Ambulatory Visit (HOSPITAL_COMMUNITY)
Admit: 2017-11-16 | Discharge: 2017-11-16 | Disposition: A | Discharge status: Home / Self Care | Payer: PPO | Attending: Cardiovascular Disease | Admitting: Cardiovascular Disease

## 2017-11-16 ENCOUNTER — Other Ambulatory Visit: Payer: Self-pay | Admitting: *Deleted

## 2017-11-16 ENCOUNTER — Encounter (HOSPITAL_COMMUNITY): Payer: Self-pay

## 2017-11-16 ENCOUNTER — Encounter: Payer: Self-pay | Admitting: Physician Assistant

## 2017-11-16 DIAGNOSIS — I350 Nonrheumatic aortic (valve) stenosis: Secondary | ICD-10-CM

## 2017-11-16 DIAGNOSIS — R0609 Other forms of dyspnea: Secondary | ICD-10-CM | POA: Insufficient documentation

## 2017-11-16 LAB — PULMONARY FUNCTION TEST
DL/VA % PRED: 90 %
DL/VA: 4.01 ml/min/mmHg/L
DLCO COR: 19.41 ml/min/mmHg
DLCO UNC: 18.22 ml/min/mmHg
DLCO cor % pred: 65 %
DLCO unc % pred: 61 %
FEF 25-75 Post: 1.13 L/sec
FEF 25-75 Pre: 1.23 L/sec
FEF2575-%CHANGE-POST: -8 %
FEF2575-%PRED-POST: 94 %
FEF2575-%PRED-PRE: 103 %
FEV1-%Change-Post: 0 %
FEV1-%PRED-POST: 91 %
FEV1-%PRED-PRE: 90 %
FEV1-Post: 1.92 L
FEV1-Pre: 1.91 L
FEV1FVC-%CHANGE-POST: 0 %
FEV1FVC-%PRED-PRE: 98 %
FEV6-%Change-Post: 2 %
FEV6-%Pred-Post: 91 %
FEV6-%Pred-Pre: 89 %
FEV6-Post: 2.62 L
FEV6-Pre: 2.56 L
FEV6FVC-%CHANGE-POST: 2 %
FEV6FVC-%Pred-Post: 109 %
FEV6FVC-%Pred-Pre: 107 %
FVC-%Change-Post: 1 %
FVC-%Pred-Post: 90 %
FVC-%Pred-Pre: 89 %
FVC-Post: 2.86 L
FVC-Pre: 2.81 L
POST FEV1/FVC RATIO: 67 %
PRE FEV1/FVC RATIO: 68 %
Post FEV6/FVC ratio: 100 %
Pre FEV6/FVC Ratio: 98 %
RV % pred: 112 %
RV: 3.15 L
TLC % pred: 90 %
TLC: 6.08 L

## 2017-11-16 MED ORDER — ALBUTEROL SULFATE (2.5 MG/3ML) 0.083% IN NEBU
2.5000 mg | INHALATION_SOLUTION | Freq: Once | RESPIRATORY_TRACT | Status: AC
  Administered 2017-11-16: 2.5 mg via RESPIRATORY_TRACT

## 2017-11-16 MED ORDER — IOPAMIDOL (ISOVUE-370) INJECTION 76%
INTRAVENOUS | Status: AC
  Administered 2017-11-16: 100 mL
  Filled 2017-11-16: qty 100

## 2017-11-16 MED ORDER — IOPAMIDOL (ISOVUE-370) INJECTION 76%
INTRAVENOUS | Status: AC
  Filled 2017-11-16: qty 100

## 2017-11-17 ENCOUNTER — Telehealth: Payer: Self-pay | Admitting: Vascular Surgery

## 2017-11-17 ENCOUNTER — Other Ambulatory Visit: Payer: Self-pay | Admitting: Cardiovascular Disease

## 2017-11-17 DIAGNOSIS — I350 Nonrheumatic aortic (valve) stenosis: Secondary | ICD-10-CM

## 2017-11-17 NOTE — Telephone Encounter (Signed)
-----   Message from Sharee PimpleMarilyn K McChesney, RN sent at 11/16/2017  3:37 PM EDT ----- Regarding: Cancel appt Wednesday 4-10 and [facility]   ----- Message ----- From: Fransisco Hertzhen, Brian L, MD Sent: 11/16/2017  12:25 PM To: [facility]vs Charge [address]  Concha NorwayWilliam T Fawley [id number] 07/04/1924  Can be rescheduled from 11/18/16 as no cardiology clearance is expected until after his TAVR evaluation which has not occurred.  Can plan on seeing patient in another 2 months.

## 2017-11-17 NOTE — Telephone Encounter (Signed)
Unable to LVM or speak to family Mld lttr for appt on 6/5

## 2017-11-17 NOTE — Patient Outreach (Signed)
[facility] Care Management  11/17/2017  Concha NorwayWilliam T Guerrant 11/21/1923 [id number]   [facility] and spoke with patient and SNF rep. Per patient and SNF rep, he is progressing and plans for dc are pending for possible transition to ALF.  CSW was advised his SNF coverage is authorized through 11/19/17.  CSW has attempted to contact family as well for update on dc plans and will try again this week.   Reece LevyJanet Delara Shepheard, MSW, LCSW Clinical Social Worker  [facility] [phone]

## 2017-11-18 ENCOUNTER — Encounter: Payer: Self-pay | Admitting: Physician Assistant

## 2017-11-18 ENCOUNTER — Ambulatory Visit: Payer: PPO | Admitting: Vascular Surgery

## 2017-11-18 ENCOUNTER — Ambulatory Visit (INDEPENDENT_AMBULATORY_CARE_PROVIDER_SITE_OTHER): Payer: PPO | Admitting: Physician Assistant

## 2017-11-18 ENCOUNTER — Other Ambulatory Visit: Payer: Self-pay | Admitting: *Deleted

## 2017-11-18 ENCOUNTER — Encounter: Payer: Self-pay | Admitting: *Deleted

## 2017-11-18 VITALS — BP 110/70 | HR 64 | Ht 68.0 in | Wt 174.2 lb

## 2017-11-18 DIAGNOSIS — I25110 Atherosclerotic heart disease of native coronary artery with unstable angina pectoris: Secondary | ICD-10-CM | POA: Diagnosis not present

## 2017-11-18 DIAGNOSIS — I10 Essential (primary) hypertension: Secondary | ICD-10-CM

## 2017-11-18 DIAGNOSIS — I350 Nonrheumatic aortic (valve) stenosis: Secondary | ICD-10-CM | POA: Diagnosis not present

## 2017-11-18 DIAGNOSIS — E782 Mixed hyperlipidemia: Secondary | ICD-10-CM | POA: Diagnosis not present

## 2017-11-18 DIAGNOSIS — I63421 Cerebral infarction due to embolism of right anterior cerebral artery: Secondary | ICD-10-CM

## 2017-11-18 NOTE — Progress Notes (Signed)
Cardiology Office Note    Date:  11/18/2017   ID:  Samuel Vega, DOB 02/25/1924, MRN [id number]  PCP:  Porfirio OarJeffery, Chelle, PA-C  Cardiologist: Verne Carrowhristopher McAlhany, MD  Chief Complaint  Patient presents with  . Hospitalization Follow-up    History of Present Illness:  Samuel Vega is a 84 y.o. male with a history of severe aortic stenosis, HLD, HTN and recent CVA who was last seen by Dr. Clifton JamesMcAlhany 10/19/17 as a new consult for evaluation of severe aortic stenosis, referred by Dr. Katrinka BlazingSmith. He was noted to have severe aortic stenosis in September 2015 but had no symptoms at that time He was [facility]17 with an acute stroke. He was found to have high grade stenosis of his right internal carotid artery. Echo 10/10/17 with normal LV systolic function. His aortic valve is heavily thickened and calcified with severe stenosis. Mean gradient 71 mmHg, DVI 0.13. He was seen in the hospital by vascular surgery and will need a right carotid endarterectomy. At the time of his appointment, he had c/o occasional chest pain, dizziness and ongoing fatigue however, no dyspnea or lower extremity edema.    The patient had right and left heart cath 11/04/17 as prep for his TAVR and was found to have triple-vessel CAD with moderately severe stenosis in the proximal mid LAD, moderate disease in the circumflex and first OM and RCA is large dominant with severe heavy calcified stenosis.  He underwent DES to the mid RCA.  Severe aortic stenosis with mean gradient 59.5 mmHg, peak gradient 71 mmHg, AVA 0.8 cm.  Plan is to continue to DAPT with aspirin and Plavix, statin intolerant so consider Repatha.  Plan is to proceed with TAVRworkup and medical management of his LAD disease.  Patient has TCTS surgical consult with Dr. Cornelius Moraswen 11/23/17 chest and cardiac CT 11/19/17.  Patient comes in today accompanied by his son.  Patient is at [facility]. His son says this is the best he has looked in a long time.  Patient  denies any chest pain, palpitations, dyspnea, dyspnea on exertion, dizziness or presyncope.  He brings a Holter monitor back for us to evaluate.  Getting stronger every day.    Past Medical History:  Diagnosis Date  . Anxiety   . Aortic stenosis, severe    for TAVR  . CAD (coronary artery disease)    a. s/p prox RCA DES x1 11/04/17  . Cardiac murmur   . Chronic back pain   . Chronic lower back pain   . CVA (cerebral vascular accident) (HCC) 09/2017   "left sided weakness since" (11/04/2017)  . Depression   . ED (erectile dysfunction)   . HTN (hypertension)   . Hyperlipidemia   . Macular degeneration   . Myocardial infarction (HCC) 09/2017  . PTSD (post-traumatic stress disorder)   . PTSD (post-traumatic stress disorder)   . Seizure (HCC)   . 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 CATARACT EXTRACTION, BILATERAL Bilateral   . CORONARY STENT INTERVENTION N/A 11/04/2017   Procedure: CORONARY STENT INTERVENTION;  Surgeon: Kathleene HazelMcAlhany, Christopher D, MD;  Location: [facility] INVASIVE CV LAB;  Service: Cardiovascular;  Laterality: N/A;  . ELBOW SURGERY  1980s   "? side; hurt when I put it down; had to have nerves clipped"  . EYE SURGERY    . RIGHT/LEFT HEART CATH AND CORONARY ANGIOGRAPHY  N/A 11/04/2017   Procedure: RIGHT/LEFT HEART CATH AND CORONARY ANGIOGRAPHY;  Surgeon: Kathleene Hazel, MD;  Location: [facility] INVASIVE CV LAB;  Service: Cardiovascular;  Laterality: N/A;    Current Medications: Current Meds  Medication Sig  . acetaminophen (TYLENOL) 500 MG tablet Take 1,000 mg by mouth every 6 (six) hours as needed for mild pain.  Marland Kitchen aspirin 81 MG chewable tablet Chew 1 tablet (81 mg total) by mouth daily.  . clopidogrel (PLAVIX) 75 MG tablet Take 1 tablet (75 mg total) by mouth daily with breakfast.  . docusate sodium (COLACE) 100 MG capsule Take 1 capsule (100 mg total) by mouth daily as  needed for mild constipation.  . levETIRAcetam (KEPPRA) 500 MG tablet Take 1 tablet (500 mg total) by mouth 2 (two) times daily.  . nitroGLYCERIN (NITROSTAT) 0.4 MG SL tablet Place 1 tablet (0.4 mg total) under the tongue every 5 (five) minutes as needed for chest pain.  . traZODone (DESYREL) 50 MG tablet Take 50 mg by mouth at bedtime as needed for sleep.   Current Facility-Administered Medications for the 11/18/17 encounter (Office Visit) with Dyann Kief, PA-C  Medication  . nitroGLYCERIN (NITROSTAT) SL tablet 0.4 mg     Allergies:   Statins   Social History   Socioeconomic History  . Marital status: Divorced    Spouse name: n/a  . Number of children: 2  . Years of education: Not on file  . Highest education level: Not on file  Occupational History  . Occupation: retired  Engineer, production  . Financial resource strain: Not on file  . Food insecurity:    Worry: Not on file    Inability: Not on file  . Transportation needs:    Medical: Not on file    Non-medical: Not on file  Tobacco Use  . Smoking status: Former Smoker    Years: 2.00    Types: Cigarettes  . Smokeless tobacco: Never Used  . Tobacco comment: "none since 1980s" (11/04/2017)  Substance and Sexual Activity  . Alcohol use: No    Comment: none since 1980  . Drug use: Never  . Sexual activity: Yes    Partners: Female  Lifestyle  . Physical activity:    Days per week: Not on file    Minutes per session: Not on file  . Stress: Not on file  Relationships  . Social connections:    Talks on phone: Not on file    Gets together: Not on file    Attends religious service: Not on file    Active member of club or organization: Not on file    Attends meetings of clubs or organizations: Not on file    Relationship status: Not on file  Other Topics Concern  . Not on file  Social History Narrative   Divorced in [place]. 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  His grandson is a Optometrist.     Family History:  The patient's family history  includes Hypertension in his other.   ROS:   Please see the history of present illness.    Review of Systems  Constitution: Positive for malaise/fatigue.  HENT: Negative.   Eyes: Positive for visual disturbance.  Cardiovascular: Negative.   Respiratory: Negative.   Endocrine: Negative.   Hematologic/Lymphatic: Negative.   Musculoskeletal: Negative.   Gastrointestinal: Negative.   Genitourinary: Negative.   Neurological: Positive for weakness.   All other systems reviewed and are negative.   PHYSICAL EXAM:   VS:  BP 110/70   Pulse 64  Ht 5\' 8"  (1.727 m)   Wt 174 lb 3.2 oz (79 kg)   BMI 26.49 kg/m   Physical Exam  GEN: Well nourished, well developed, in no acute distress  Neck: Bilateral carotid bruits versus murmur portrayed, no JVD,  or masses Cardiac:RRR; decrease S2, 4/6 harsh systolic murmur at the left sternal border and to the carotids Respiratory:  clear to auscultation bilaterally, normal work of breathing GI: soft, nontender, nondistended, + BS Ext: Right groin at cath site without hematoma or hemorrhage, good distal pulses, otherwise without cyanosis, clubbing, or edema, Good distal pulses bilaterally Neuro:  Alert and Oriented x 3, Psych: euthymic mood, full affect  Wt Readings from Last 3 Encounters:  11/18/17 174 lb 3.2 oz (79 kg)  11/06/17 168 lb 3.4 oz (76.3 kg)  10/26/17 180 lb (81.6 kg)      Studies/Labs Reviewed:   EKG is ordered today.  The ekg ordered today demonstrates normal sinus rhythm with first-degree AV block right bundle branch block nonspecific ST-T wave changes, no acute change  Recent Labs: 10/26/2017: ALT 12; Magnesium 1.9 11/05/2017: BUN 9; Creatinine, Ser 0.93; Hemoglobin 12.6; Platelets 135; Potassium 3.6; Sodium 141   Lipid Panel    Component Value Date/Time   CHOL 235 (H) 10/09/2017 0456   TRIG 87 10/09/2017 0456   HDL 96 10/09/2017 0456   CHOLHDL 2.4 10/09/2017 0456   VLDL 17 10/09/2017 0456   LDLCALC 122 (H) 10/09/2017  0456    Additional studies/ records that were reviewed today include:   Cardiac catheterization 11/04/17:    Prox RCA-1 lesion is 30% stenosed.  Prox RCA-2 lesion is 95% stenosed.  Mid RCA to Dist RCA lesion is 20% stenosed.  Ost RPDA lesion is 30% stenosed.  Ost 3rd Mrg lesion is 50% stenosed.  Prox Cx to Mid Cx lesion is 30% stenosed.  Ost LAD to Prox LAD lesion is 70% stenosed.  Prox LAD-1 lesion is 70% stenosed.  Prox LAD-2 lesion is 70% stenosed.  Mid LAD lesion is 50% stenosed.  Mid LAD to Dist LAD lesion is 50% stenosed.  A drug-eluting stent was successfully placed using a STENT SYNERGY DES 2.75X20.  Post intervention, there is a 0% residual stenosis.   1. Triple vessel CAD 2. Moderately severe, calcified stenosis in the proximal and mid LAD 3. Moderate disease in the Circumflex and first OM branch 4. The RCA is large dominant vessel with severe, heavily calcified stenosis in the mid vessel.  5. Successful PTCA/DES in the mid RCA 6. Severe aortic stenosis (mean gradient 59.5 mmHg, peak gradient 71 mmHg, AVA 0.8 cm2)   Recommendations: Will continue DAPT with ASA and Plavix. He is statin intolerant. Consider Praluent/Repatha. Will monitor overnight. Will continue planning for TAVR. Will plan medical therapy of his LAD.     CT of the head and neck 3/1/19IMPRESSION: 1. 70-80% atheromatous narrowing at the right ICA bulb. 2. No other significant stenosis in the neck. 3. Intracranial atherosclerosis without proximal or correctable stenosis. There is moderate atheromatous narrowing of the distal left P2 segment. 4. Presumed infarct in the right parietal lobe (see brain MRI report). Mild associated enhancement would correlate with subacute timing.     Electronically Signed   By: Marnee Spring M.D.   On: 10/09/2017 10:59       ASSESSMENT:    1. Coronary artery disease involving native coronary artery of native heart with unstable angina pectoris (HCC)    2. Aortic stenosis, severe   3. 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 Essential hypertension   5. Mixed hyperlipidemia      PLAN:  In order of problems listed above:  CAD three-vessel status post DES to the RCA 11/04/17 for medical treatment of residual LAD and circumflex disease.  DAPT with aspirin and Plavix, statin intolerant.  Will refer to lipid clinic for Repatha or Praluent.  Severe aortic stenosis appointment with Dr. Cornelius Moras scheduled for 11/23/17 for possible TAVR.  Follow-up with Dr. Clifton James  CVA due to embolus from right carotid and will need surgery.  Was living at home but now in [facility] for rehab  Essential hypertension blood pressure is difficult to hear on exam but palpated 110/70 bilateral arms  Hyperlipidemia intolerant to statins.  Refer to lipid clinic for possible PCSK9 inhibitor  Medication Adjustments/Labs and Tests Ordered: Current medicines are reviewed at length with the patient today.  Concerns regarding medicines are outlined above.  Medication changes, Labs and Tests ordered today are listed in the Patient Instructions below. Patient Instructions  Medication Instructions:  Your physician recommends that you continue on your current medications as directed. Please refer to the Current Medication list given to you today.  Labwork: NONE  Testing/Procedures: NONE  Follow-Up: Your physician recommends that you schedule a follow-up appointment with lipid clinic to discuss Repatha.  Your physician recommends that you schedule a follow-up appointment with Dr. Clifton James, his nurse will contact you for follow-up.     If you need a refill on your cardiac medications before your next appointment, please call your pharmacy.       Elson Clan, PA-C  11/18/2017 12:43 PM    [facility] [address] [place], [place], [place]  [address] Phone: [phone]; Fax: [phone]

## 2017-11-18 NOTE — Progress Notes (Signed)
Patient ID: Samuel Vega, male   DOB: 03/23/1924, 84 y.o.   MRN: [id number]  Patient returned [employer] MCT-1 monitor cell phone and charger to our office today. We will forward this to [employer].  They did not bring in the prepaid packaging or the patch brain chip for the patches, which,  was given at time of enrollment.

## 2017-11-18 NOTE — Patient Instructions (Signed)
Medication Instructions:  Your physician recommends that you continue on your current medications as directed. Please refer to the Current Medication list given to you today.  Labwork: NONE  Testing/Procedures: NONE  Follow-Up: Your physician recommends that you schedule a follow-up appointment with lipid clinic to discuss Repatha.  Your physician recommends that you schedule a follow-up appointment with Dr. Clifton JamesMcAlhany, his nurse will contact you for follow-up.     If you need a refill on your cardiac medications before your next appointment, please call your pharmacy.

## 2017-11-19 ENCOUNTER — Ambulatory Visit (HOSPITAL_COMMUNITY): Payer: PPO

## 2017-11-19 ENCOUNTER — Ambulatory Visit (HOSPITAL_COMMUNITY): Admission: RE | Admit: 2017-11-19 | Payer: PPO | Source: Ambulatory Visit

## 2017-11-19 ENCOUNTER — Other Ambulatory Visit (HOSPITAL_COMMUNITY): Payer: PPO

## 2017-11-19 NOTE — Patient Outreach (Signed)
[facility]) Care Management  11/19/2017  Concha NorwayWilliam T Champagne 05/10/1924 [id number]   CSW spoke with patient's son, Christen BameRonnie, by phone who reports plans are underway for dc [facility] SNF [facility].  "he does not need to go back to his home alone".  He feels the community will offer socialization, support, meals and other assistance as needed. He anticipates the apartment being available next week and is coordinating with SNF dc planner for any HH/DME needs.  Per son, SN[facility] is only approved through 11/19/17 and he plans to inquire with SNF about this and possible extension and/or appeal. He feels his father is still in need of SNF rehab to continue his strengthening for dc at an optimal independent level.   CSW will contact SNF rep as well for updates and assistance with pt needs and planning and plan f/u call to son next week.   Reece LevyJanet Siria Calandro, MSW, LCSW Clinical Social Worker  [facility] [phone]

## 2017-11-20 ENCOUNTER — Other Ambulatory Visit: Payer: Self-pay | Admitting: *Deleted

## 2017-11-23 ENCOUNTER — Ambulatory Visit (HOSPITAL_COMMUNITY)
Admission: RE | Admit: 2017-11-23 | Discharge: 2017-11-23 | Disposition: A | Discharge status: Home / Self Care | Payer: PPO | Source: Ambulatory Visit | Attending: Cardiovascular Disease | Admitting: Cardiovascular Disease

## 2017-11-23 ENCOUNTER — Encounter: Payer: PPO | Admitting: Thoracic Surgery (Cardiothoracic Vascular Surgery)

## 2017-11-23 ENCOUNTER — Other Ambulatory Visit: Payer: Self-pay | Admitting: Cardiovascular Disease

## 2017-11-23 ENCOUNTER — Encounter (HOSPITAL_COMMUNITY): Payer: Self-pay | Admitting: Physician Assistant

## 2017-11-23 DIAGNOSIS — I878 Other specified disorders of veins: Secondary | ICD-10-CM | POA: Diagnosis not present

## 2017-11-23 DIAGNOSIS — J90 Pleural effusion, not elsewhere classified: Secondary | ICD-10-CM | POA: Insufficient documentation

## 2017-11-23 DIAGNOSIS — I2510 Atherosclerotic heart disease of native coronary artery without angina pectoris: Secondary | ICD-10-CM | POA: Insufficient documentation

## 2017-11-23 DIAGNOSIS — I700 Atherosclerosis of aorta: Secondary | ICD-10-CM | POA: Diagnosis not present

## 2017-11-23 DIAGNOSIS — N281 Cyst of kidney, acquired: Secondary | ICD-10-CM | POA: Insufficient documentation

## 2017-11-23 DIAGNOSIS — I350 Nonrheumatic aortic (valve) stenosis: Secondary | ICD-10-CM

## 2017-11-23 DIAGNOSIS — I872 Venous insufficiency (chronic) (peripheral): Secondary | ICD-10-CM | POA: Diagnosis not present

## 2017-11-23 HISTORY — PX: IR US GUIDE VASC ACCESS RIGHT: IMG2390

## 2017-11-23 HISTORY — PX: IR RADIOLOGY PERIPHERAL GUIDED IV START: IMG5598

## 2017-11-23 MED ORDER — IOPAMIDOL (ISOVUE-370) INJECTION 76%
INTRAVENOUS | Status: AC
  Filled 2017-11-23: qty 100

## 2017-11-23 MED ORDER — LIDOCAINE HCL (PF) 2 % IJ SOLN
INTRAMUSCULAR | Status: AC
  Filled 2017-11-23: qty 20

## 2017-11-23 MED ORDER — LIDOCAINE HCL 2 % IJ SOLN
INTRAMUSCULAR | Status: AC | PRN
  Administered 2017-11-23: 10 mL

## 2017-11-23 MED ORDER — IOPAMIDOL (ISOVUE-370) INJECTION 76%
100.0000 mL | Freq: Once | INTRAVENOUS | Status: AC | PRN
  Administered 2017-11-23: 100 mL via INTRAVENOUS

## 2017-11-23 NOTE — Procedures (Signed)
  PROCEDURE SUMMARY:  Successful placement of a venous catheter into the right basilic vein.  No complications Ready for use.  Please see full dictation in Imaging section for details.   Jerry CarasWENDY S Selia Wareing PA-C 11/23/2017 12:57 PM

## 2017-11-23 NOTE — Patient Outreach (Signed)
[facility]) Care Management  11/23/2017  Samuel NorwayWilliam T Vega 11/21/1923 [id number]   [facility] at [facility] on 11/20/2017.  Pt reports plans to [facility] "on a trial basis".  Per pt, his son is coordinating the move.  CSW was not able to speak with dc planner to get updates on plans but today have spoken with SNF rep who states plans are in place for dc and move to the ALF later this week.  CSW will plan f/u call or visit later this week for updates on dc plans.   Reece LevyJanet Annalese Stiner, MSW, LCSW Clinical Social Worker  [facility] [phone]

## 2017-11-26 ENCOUNTER — Other Ambulatory Visit: Payer: Self-pay | Admitting: *Deleted

## 2017-11-26 NOTE — Patient Outreach (Signed)
[facility]) Care Management  11/26/2017  Samuel Vega 10/17/1923 [id number]   Patient and family are having some disagreement on dc plans from SNF. Pt was [facility] but has changed his mind because if cost and size of his room there.  "I got more space where I'm at".  CSW expressed concerns related to his ability to manage at home alone.  Pt was pleasant yet stern on his decisions.   SNF rep indicates the son has stepped out of the dc planning discussions and pt's sister is now involved and helping pt to make decisions.   CSW will plan a f/u call early next week for updates on his disposition/decision.  Reece LevyJanet Yanessa Hocevar, MSW, LCSW Clinical Social Worker  [facility] [phone]

## 2017-11-28 DIAGNOSIS — R05 Cough: Secondary | ICD-10-CM | POA: Diagnosis not present

## 2017-11-30 ENCOUNTER — Encounter: Payer: PPO | Admitting: Thoracic Surgery (Cardiothoracic Vascular Surgery)

## 2017-11-30 DIAGNOSIS — J189 Pneumonia, unspecified organism: Secondary | ICD-10-CM | POA: Diagnosis not present

## 2017-11-30 DIAGNOSIS — R1312 Dysphagia, oropharyngeal phase: Secondary | ICD-10-CM | POA: Diagnosis not present

## 2017-12-01 ENCOUNTER — Encounter: Payer: PPO | Admitting: Thoracic Surgery (Cardiothoracic Vascular Surgery)

## 2017-12-02 ENCOUNTER — Other Ambulatory Visit (HOSPITAL_COMMUNITY): Payer: Self-pay | Admitting: Internal Medicine

## 2017-12-02 DIAGNOSIS — F410 Panic disorder [episodic paroxysmal anxiety] without agoraphobia: Secondary | ICD-10-CM | POA: Diagnosis not present

## 2017-12-02 DIAGNOSIS — I639 Cerebral infarction, unspecified: Secondary | ICD-10-CM | POA: Diagnosis not present

## 2017-12-02 DIAGNOSIS — J189 Pneumonia, unspecified organism: Secondary | ICD-10-CM | POA: Diagnosis not present

## 2017-12-02 DIAGNOSIS — I63421 Cerebral infarction due to embolism of right anterior cerebral artery: Secondary | ICD-10-CM

## 2017-12-02 DIAGNOSIS — R569 Unspecified convulsions: Secondary | ICD-10-CM | POA: Diagnosis not present

## 2017-12-02 DIAGNOSIS — I350 Nonrheumatic aortic (valve) stenosis: Secondary | ICD-10-CM | POA: Diagnosis not present

## 2017-12-02 DIAGNOSIS — R1319 Other dysphagia: Secondary | ICD-10-CM

## 2017-12-02 DIAGNOSIS — R253 Fasciculation: Secondary | ICD-10-CM | POA: Diagnosis not present

## 2017-12-02 DIAGNOSIS — Z955 Presence of coronary angioplasty implant and graft: Secondary | ICD-10-CM | POA: Diagnosis not present

## 2017-12-02 DIAGNOSIS — R52 Pain, unspecified: Secondary | ICD-10-CM | POA: Diagnosis not present

## 2017-12-02 DIAGNOSIS — R05 Cough: Secondary | ICD-10-CM | POA: Diagnosis not present

## 2017-12-03 ENCOUNTER — Ambulatory Visit: Payer: Self-pay | Admitting: *Deleted

## 2017-12-04 ENCOUNTER — Other Ambulatory Visit: Payer: Self-pay | Admitting: *Deleted

## 2017-12-04 ENCOUNTER — Ambulatory Visit (HOSPITAL_COMMUNITY): Payer: PPO

## 2017-12-04 ENCOUNTER — Ambulatory Visit (HOSPITAL_COMMUNITY)
Admission: RE | Admit: 2017-12-04 | Discharge: 2017-12-04 | Disposition: A | Discharge status: Home / Self Care | Payer: PPO | Source: Ambulatory Visit | Attending: Internal Medicine | Admitting: Internal Medicine

## 2017-12-04 DIAGNOSIS — R1319 Other dysphagia: Secondary | ICD-10-CM

## 2017-12-04 DIAGNOSIS — R1310 Dysphagia, unspecified: Secondary | ICD-10-CM | POA: Diagnosis not present

## 2017-12-04 NOTE — Patient Outreach (Signed)
[facility]) Care Management  12/04/2017  Concha NorwayWilliam T Aldape 08/03/1924 [id number]   CSW was informed by [facility] that pt was released to home on 12/01/17 with "[facility] hour [facility]" [facility].  CSW made contact with pt who was en route to see his heart doctor and asked that I call back later.    CSW will place [facility] post [facility] dc and plan a follow up call next week.    Reece LevyJanet Kynadie Yaun, MSW, LCSW Clinical Social Worker  [facility] [phone]

## 2017-12-08 ENCOUNTER — Other Ambulatory Visit: Payer: Self-pay | Admitting: *Deleted

## 2017-12-08 ENCOUNTER — Telehealth: Payer: Self-pay

## 2017-12-08 NOTE — Patient Outreach (Signed)
[facility]) Care Management  12/08/2017  Samuel Vega 1923-11-10 [id number]   Referral received from LCSW as member was recently discharged from SNF.  Per chart, he has history of aortic stenosis, CVA, carotid stenosis, hypertension, dementia, and hyperlipidemia.  Call placed to member for transition of care, no answer.  HIPAA compliant voice message left, will await call back.  Outreach letter sent, will follow up within 3 business days if no call back.  Kemper Durie, [place], MSN [facility] Care Management  [facility] : [facility] Manager [phone]

## 2017-12-08 NOTE — Telephone Encounter (Signed)
Copied from CRM [id number]. Topic: Quick Communication - See Telephone Encounter >> Dec 08, 2017 12:36 PM Raquel Sarna wrote: Pt needing a call back to discuss medication he needs to help him sleep and get ready for his heart tests tomorrow. Pt wanting a call back.  If it can be called in to [facility].

## 2017-12-08 NOTE — Telephone Encounter (Signed)
Please get details. ?Desyrel?

## 2017-12-08 NOTE — Telephone Encounter (Addendum)
Phone call to patient to clarify. Patient states, "I need rest, I can't rest. So much is going through my mind. I have an appointment tomorrow with two doctors. Tomorrow they gonna let me know if I can get a stent for complete flow of blood that I need. I need something to get me some rest so I can go in and pass the test."  Current medication list updated, preferred pharmacy confirmed.   Provider, please advise.

## 2017-12-09 ENCOUNTER — Encounter: Payer: Self-pay | Admitting: Surgery

## 2017-12-09 ENCOUNTER — Ambulatory Visit: Payer: PPO | Attending: Cardiovascular Disease | Admitting: Physical Therapy

## 2017-12-09 ENCOUNTER — Telehealth: Payer: Self-pay

## 2017-12-09 ENCOUNTER — Institutional Professional Consult (permissible substitution) (INDEPENDENT_AMBULATORY_CARE_PROVIDER_SITE_OTHER): Payer: PPO | Admitting: Surgery

## 2017-12-09 ENCOUNTER — Encounter: Payer: Self-pay | Admitting: Physical Therapy

## 2017-12-09 ENCOUNTER — Encounter: Payer: Self-pay | Admitting: Physician Assistant

## 2017-12-09 ENCOUNTER — Ambulatory Visit (INDEPENDENT_AMBULATORY_CARE_PROVIDER_SITE_OTHER): Payer: PPO | Admitting: Physician Assistant

## 2017-12-09 ENCOUNTER — Other Ambulatory Visit: Payer: Self-pay

## 2017-12-09 ENCOUNTER — Other Ambulatory Visit: Payer: Self-pay | Admitting: *Deleted

## 2017-12-09 VITALS — BP 114/66 | HR 77 | Ht 68.0 in | Wt 174.0 lb

## 2017-12-09 VITALS — BP 108/66 | HR 74 | Resp 16 | Ht 68.0 in | Wt 158.8 lb

## 2017-12-09 DIAGNOSIS — F0390 Unspecified dementia without behavioral disturbance: Secondary | ICD-10-CM

## 2017-12-09 DIAGNOSIS — R569 Unspecified convulsions: Secondary | ICD-10-CM

## 2017-12-09 DIAGNOSIS — G4700 Insomnia, unspecified: Secondary | ICD-10-CM

## 2017-12-09 DIAGNOSIS — R2689 Other abnormalities of gait and mobility: Secondary | ICD-10-CM | POA: Insufficient documentation

## 2017-12-09 DIAGNOSIS — K5900 Constipation, unspecified: Secondary | ICD-10-CM | POA: Diagnosis not present

## 2017-12-09 DIAGNOSIS — I350 Nonrheumatic aortic (valve) stenosis: Secondary | ICD-10-CM

## 2017-12-09 DIAGNOSIS — M6281 Muscle weakness (generalized): Secondary | ICD-10-CM | POA: Diagnosis not present

## 2017-12-09 DIAGNOSIS — I63421 Cerebral infarction due to embolism of right anterior cerebral artery: Secondary | ICD-10-CM

## 2017-12-09 DIAGNOSIS — I25110 Atherosclerotic heart disease of native coronary artery with unstable angina pectoris: Secondary | ICD-10-CM

## 2017-12-09 MED ORDER — LEVETIRACETAM 500 MG PO TABS: 500.0000 mg | ORAL_TABLET | Freq: Two times a day (BID) | ORAL | 3 refills | Status: DC

## 2017-12-09 MED ORDER — CLOPIDOGREL BISULFATE 75 MG PO TABS: 75.0000 mg | ORAL_TABLET | Freq: Every day | ORAL | 3 refills | Status: DC

## 2017-12-09 MED ORDER — DOCUSATE SODIUM 100 MG PO CAPS: 100.0000 mg | ORAL_CAPSULE | Freq: Every day | ORAL | 3 refills | Status: DC | PRN

## 2017-12-09 MED ORDER — TRAZODONE HCL 100 MG PO TABS: 100.0000 mg | ORAL_TABLET | Freq: Every day | ORAL | 3 refills | Status: DC

## 2017-12-09 NOTE — Patient Outreach (Signed)
[facility]) Care Management  12/09/2017  Samuel Vega Dec 16, 1923 [id number]   CSW contacted pt today by phone and was told he was at a Doctor appointment. Pt is doing well at home per the family member- he has caregiver support at home 24/7 and no concerns or needs voiced.   CSW will attempt to reach pt again next week.  [name], MSW, LCSW Clinical Social Worker  [facility] [phone]

## 2017-12-09 NOTE — Telephone Encounter (Signed)
Rx's faxed to [facility] [phone] for Keppra  #180 3RF Trazodone  #90 3RF Plavix  #90 3RF Colace  #90 3RF per Porfirio Oar PA I also called [facility] at [phone] and cancelled the Rx for Keppra that was sent in error, I left a detailed message on the voicemail and it was confirmed sent by automated message.

## 2017-12-09 NOTE — Progress Notes (Signed)
Subjective:    Patient ID: Samuel Vega, male    DOB: May 20, 1924, 84 y.o.   MRN: [id number] Chief Complaint  Patient presents with  . Hospitalization Follow-up    rehab f/up needs home health    HPI  84 yo male presents for hospitalization follow up after being admitted for CVA 10/09/17. Recent heart cath found to have triple vessel CAD. DES placed in RCA. Patient reports feeling significantly better after stent placement. Patient planning to get TAVR after being cleared. He has recently been dc'ed from the SNF 7 days ago. He is accompanied by his nephew today.  Reports difficulty sleeping every night of the week, this has been going on for about 8 weeks. He is currently taking trazodone , but it does not provide him enough relief. Trouble falling asleep and staying asleep. He is anxious about his upcoming TAVR and this has caused increased stress.  He has not been to PT in a month due to insurance running out, they plan to work on getting this fixed, per nephew. Today they had a visit with PT, baseline evaluation prior to surgery according to nephew.  Today he went to see one of his surgeons to speak about his TAVR.  He has another visit with another surgeon on May 15th.  Concern for his poor appetite since stroke. Nephew is giving him ensure to help compensate for his lack of hunger.   Review of Systems  Constitutional: Positive for appetite change. Negative for chills, diaphoresis, fatigue and fever.  Eyes: Negative.   Respiratory: Negative for chest tightness and shortness of breath.   Cardiovascular: Negative for chest pain and palpitations.  Endocrine: Negative.   Allergic/Immunologic: Negative.   Neurological: Negative for dizziness, syncope, weakness, light-headedness and headaches.    Patient Active Problem List   Diagnosis Date Noted  . HTN (hypertension) 11/05/2017  . S/P right coronary artery (RCA) stent placement 11/05/2017  . Unstable angina (HCC)  11/04/2017  . Coronary artery disease involving native coronary artery of native heart with unstable angina pectoris (HCC)   . Severe aortic stenosis   . Seizure (HCC) 10/26/2017  . Dementia without behavioral disturbance   . Leukocytosis   . Acute blood loss anemia   . Acute CVA (cerebrovascular accident) (HCC) 10/09/2017  . Elevated troponin 10/09/2017  . Rhabdomyolysis 10/09/2017  . Fall   . Syncope   . Carotid stenosis, right   . Cerebrovascular accident (CVA) due to embolism of right anterior cerebral artery (HCC)   . Loss of weight 03/20/2014  . Chronic back pain   . Hyperlipidemia 07/29/2012  . Hearing loss 07/29/2012  . Macular degeneration   . Tinnitus   . ED (erectile dysfunction)   . PTSD (post-traumatic stress disorder)   . Aortic stenosis, severe 09/11/2009    Past Medical History:  Diagnosis Date  . Anxiety   . Aortic stenosis, severe    for TAVR  . CAD (coronary artery disease)    a. s/p prox RCA DES x1 11/04/17  . Cardiac murmur   . Chronic back pain   . Chronic lower back pain   . CVA (cerebral vascular accident) (HCC) 09/2017   "left sided weakness since" (11/04/2017)  . Depression   . ED (erectile dysfunction)   . HTN (hypertension)   . Hyperlipidemia   . Macular degeneration   . Myocardial infarction (HCC) 09/2017  . PTSD (post-traumatic stress disorder)   . PTSD (post-traumatic stress disorder)   . Seizure (HCC)   .  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 Authorizing Provider  aspirin 81 MG chewable tablet Chew 1 tablet (81 mg total) by mouth daily. 11/05/17  Yes Georgie Chard D, NP  clopidogrel (PLAVIX) 75 MG tablet Take 1 tablet (75 mg total) by mouth daily with breakfast. 11/05/17  Yes Georgie Chard D, NP  levETIRAcetam (KEPPRA) 500 MG tablet Take 1 tablet (500 mg total) by mouth 2 (two) times daily. 10/28/17  Yes Glade Lloyd, MD  nitroGLYCERIN (NITROSTAT) 0.4 MG SL tablet Place 1 tablet (0.4 mg total) under the tongue every 5 (five) minutes as needed for chest pain. 11/05/17  Yes Georgie Chard D, NP  traZODone (DESYREL) 50 MG tablet Take 50 mg by mouth at bedtime as needed for sleep.    Yes [provider]  docusate sodium (COLACE) 100 MG capsule Take 1 capsule (100 mg total) by mouth daily as needed for mild constipation. Patient not taking: Reported on 12/09/2017 10/28/17   Glade Lloyd, MD  levofloxacin (LEVAQUIN) 500 MG tablet Take 500 mg by mouth daily.    [provider]    Allergies  Allergen Reactions  . Statins Other (See Comments)    MYALGIAS       Objective:   Physical Exam  Constitutional: He appears well-developed. No distress.  HENT:  Head: Normocephalic.  BP 108/66   Pulse 74   Resp 16   Ht  (1.727 m)   Wt 158 lb 12.8 oz (72 kg)   SpO2 97%   BMI 24.15 kg/m    Neck: Normal range of motion. Neck supple. No tracheal deviation present. No thyromegaly present.  Cardiovascular: Normal rate and regular rhythm.  Murmur heard. Pulmonary/Chest: Effort normal and breath sounds normal. No stridor. No respiratory distress. He has no wheezes. He has no rales.  Lymphadenopathy:    He has no cervical adenopathy.  Neurological: He is alert.  Skin: Skin is warm and dry. No rash noted. He is not diaphoretic. No erythema. No pallor.      Wt Readings from Last 3 Encounters:  12/09/17 174 lb (78.9 kg)  11/18/17 174 lb 3.2 oz (79 kg)  11/06/17 168 lb 3.4 oz (76.3 kg)       Assessment & Plan:  1. Insomnia, unspecified type Increased dose from  to  to help with sleep.  Suspect getting some sleep will help with his appetite  - traZODone (DESYREL) 100 MG tablet; Take 1 tablet (100 mg total) by mouth at bedtime.  Dispense: 90 tablet; Refill: 3  2. Cerebrovascular accident (CVA) due to embolism of right anterior cerebral artery (HCC) - clopidogrel (PLAVIX)  75 MG tablet; Take 1 tablet (75 mg total) by mouth daily with breakfast.  Dispense: 90 tablet; Refill: 3  3. Seizure (HCC) - levETIRAcetam (KEPPRA) 500 MG tablet; Take 1 tablet (500 mg total) by mouth 2 (two) times daily.  Dispense: 180 tablet; Refill: 3  4. Aortic stenosis, severe Pt is in the process of deciding if he would like to have the surgery or not.  Planning to see another surgeon 12/23/17.  5. Coronary artery disease involving native coronary artery of native heart with unstable angina pectoris (HCC)  - clopidogrel (PLAVIX) 75 MG tablet; Take 1 tablet (75 mg total) by mouth daily with breakfast.  Dispense: 90 tablet; Refill: 3  6. Dementia without behavioral disturbance,  unspecified dementia type   7. Constipation, unspecified constipation type Continue colace as needed. - docusate sodium (COLACE) 100 MG capsule; Take 1 capsule (100 mg total) by mouth daily as needed for mild constipation.  Dispense: 90 capsule; Refill: 3

## 2017-12-09 NOTE — Telephone Encounter (Signed)
The patient is on my schedule today. Plan to address this in person.

## 2017-12-09 NOTE — Progress Notes (Signed)
Patient ID: Samuel Vega, male    DOB: 1923/11/22, 84 y.o.   MRN: [id number]  PCP: Porfirio Oar, PA-C  Chief Complaint  Patient presents with  . Hospitalization Follow-up    rehab f/up needs home health     Subjective:   Presents for evaluation of insomnia, and follow-up from recent hospitalization. He is accompanied by his nephew, [name].  I last saw this patient 08/18/2017, though have seen him for a number of years for PTSD, hyperlipidemia, chronic back pain. In addition, he has chronic hearing loss, macular degeneration and long-standing, previously asymptomatic aortic stenosis. His care has been complicated by his desire to use his veterans benefits, and receive services through the [place] facilities, but needing care more frequently than he could get appointments there, for which he came to this office.  More recently, memory concerns arose, which he denied repeatedly.  On 10/08/2017 he was admitted to [facility] after he was found on the floor at home. Evaluation in the ED revealed a RIGHT parietal stroke and MI, with LEFT sided deficits.  Statin was held due to rhabdomyolysis. During that hospitalization, he was found to have 70-80% RIGHT ICA stenosis. ECHO 10/10/2017 revealed normal LV systolic function. Aortic valve was heavily thickened and calcified. He was discharged to a skilled nursing facility on 10/13/2017.  He was readmitted on 10/26/2017 following apparent seizure activity, abdominal muscle fasciculation, and was evaluated by neurology. He was started on Keppra, and discharged to SNF on 10/28/2017.  He saw cardiology as an outpatient, reporting fatigue, but no SOB, occasional chest pain, some dizziness. Due to severe, stage D, aortic valve stenosis, AVR was recommended. Due to his age, TAVR recommended over conventional AVR. Heart cath on 3/27 notable for triple vessel CAD and he underwent DES to the mid RCA. Plan is to continue to DAPT with aspirin and Plavix, statin  intolerant so consider Repatha.  Plan is to proceed with TAVRworkup and medical management of his LAD disease.  Is Home Health for veterans an option? The person he has sitting with him currently is not a good fit. They'd like to know what other options/resources exist.  Other than lack of appetite, he feels well.   Review of Systems  Constitutional: Positive for appetite change (lack of appetite). Negative for chills and fever.  Respiratory: Negative for cough and shortness of breath.   Cardiovascular: Negative for chest pain, palpitations and leg swelling.  Gastrointestinal: Negative for diarrhea, nausea and vomiting.  Endocrine: Negative for polydipsia.  Genitourinary: Negative for dysuria, frequency and urgency.  Musculoskeletal: Negative for myalgias.  Skin: Negative for rash.  Neurological: Negative for dizziness and headaches.  Hematological: Negative for adenopathy. Does not bruise/bleed easily.  Psychiatric/Behavioral: Positive for dysphoric mood and sleep disturbance (insomnia). Negative for decreased concentration, hallucinations, self-injury and suicidal ideas. The patient is nervous/anxious. The patient is not hyperactive.        Patient Active Problem List   Diagnosis Date Noted  . HTN (hypertension) 11/05/2017  . S/P right coronary artery (RCA) stent placement 11/05/2017  . Unstable angina (HCC) 11/04/2017  . Coronary artery disease involving native coronary artery of native heart with unstable angina pectoris (HCC)   . Severe aortic stenosis   . Seizure (HCC) 10/26/2017  . Dementia without behavioral disturbance   . Leukocytosis   . Acute blood loss anemia   . Acute CVA (cerebrovascular accident) (HCC) 10/09/2017  . Elevated troponin 10/09/2017  . Rhabdomyolysis 10/09/2017  . Fall   .  Syncope   . Carotid stenosis, right   . Cerebrovascular accident (CVA) due to embolism of right anterior cerebral artery (HCC)   . Loss of weight 03/20/2014  . Chronic back  pain   . Hyperlipidemia 07/29/2012  . Hearing loss 07/29/2012  . Macular degeneration   . Tinnitus   . ED (erectile dysfunction)   . PTSD (post-traumatic stress disorder)   . Aortic stenosis, severe 09/11/2009     Prior to Admission medications   Medication Sig Start Date End Date Taking? Authorizing Provider  aspirin 81 MG chewable tablet Chew 1 tablet (81 mg total) by mouth daily. 11/05/17  Yes Georgie Chard D, NP  clopidogrel (PLAVIX) 75 MG tablet Take 1 tablet (75 mg total) by mouth daily with breakfast. 11/05/17  Yes Georgie Chard D, NP  levETIRAcetam (KEPPRA) 500 MG tablet Take 1 tablet (500 mg total) by mouth 2 (two) times daily. 10/28/17  Yes Glade Lloyd, MD  nitroGLYCERIN (NITROSTAT) 0.4 MG SL tablet Place 1 tablet (0.4 mg total) under the tongue every 5 (five) minutes as needed for chest pain. 11/05/17  Yes Georgie Chard D, NP  traZODone (DESYREL) 50 MG tablet Take 50 mg by mouth at bedtime as needed for sleep.    Yes [provider]     Allergies  Allergen Reactions  . Statins Other (See Comments)    MYALGIAS       Objective:  Physical Exam  Constitutional: He is oriented to person, place, and time. He appears well-developed and well-nourished. He is active and cooperative. No distress.  BP 108/66   Pulse 74   Resp 16   Ht  (1.727 m)   Wt 158 lb 12.8 oz (72 kg)   SpO2 97%   BMI 24.15 kg/m   HENT:  Head: Normocephalic and atraumatic.  Right Ear: Hearing normal.  Left Ear: Hearing normal.  Eyes: Conjunctivae are normal. No scleral icterus.  Neck: Normal range of motion. Neck supple. No thyromegaly present.  Cardiovascular: Normal rate and regular rhythm.  Murmur (loud systolic murmur) heard. Pulses:      Radial pulses are 2+ on the right side, and 2+ on the left side.  Pulmonary/Chest: Effort normal and breath sounds normal.  Lymphadenopathy:       Head (right side): No tonsillar, no preauricular, no posterior auricular and no occipital  adenopathy present.       Head (left side): No tonsillar, no preauricular, no posterior auricular and no occipital adenopathy present.    He has no cervical adenopathy.       Right: No supraclavicular adenopathy present.       Left: No supraclavicular adenopathy present.  Neurological: He is alert and oriented to person, place, and time. No sensory deficit.  Skin: Skin is warm, dry and intact. No rash noted. No cyanosis or erythema. Nails show no clubbing.  Psychiatric: He has a normal mood and affect. His speech is normal and behavior is normal.    Wt Readings from Last 3 Encounters:  12/09/17 158 lb 12.8 oz (72 kg)  12/09/17 174 lb (78.9 kg)  11/18/17 174 lb 3.2 oz (79 kg)       Assessment & Plan:   Problem List Items Addressed This Visit    Aortic stenosis, severe    He saw Dr. Laneta Simmers earlier today. He and his family will discuss whether he would like to proceed with valve replacement.  He understands that deferring the surgery may result in worsening of his condition  such that he may no longer be a candidate.      Cerebrovascular accident (CVA) due to embolism of right anterior cerebral artery (HCC)    Continue clopidogrel.  Follow-up with cardiology and neurology.      Dementia without behavioral disturbance    Recommend further evaluation with neurology as the patient denies any sort of memory or other cognitive issues.      Seizure [facility])    Follow-up with neurology as planned.  He would like to discontinue Keppra.      Coronary artery disease involving native coronary artery of native heart with unstable angina pectoris (HCC)    Continue per cardiology.      Insomnia - Primary    I suspect this is related to anxiety and depression.  He has previously not tolerated SSRIs for treatment of PTSD, though he also does not recall ever having taken them.  He has tolerated trazodone but 50 mg is not currently adequate.  Try increasing to 100 mg.  He has a high fall risk.  Will  attempt to balance assistance with sleep with risk for falls.       Other Visit Diagnoses    Constipation, unspecified constipation type       He has Colace from his recent hospitalization.  High-fiber diet.  Adequate hydration.       Return in about 2 weeks (around 12/25/2017) for re-evaluation of sleep, aortic stenosis.   Fernande Bras, PA-C [facility]

## 2017-12-09 NOTE — Progress Notes (Signed)
Patient ID: Samuel Vega, male   DOB: Oct 22, 1923, 84 y.o.   MRN: [id number]  [facility]  MULTIDISCIPLINARY HEART VALVE CLINIC  CARDIOTHORACIC SURGERY CONSULTATION REPORT  Referring Provider is Chilton Si, MD PCP is Porfirio Oar, PA-C  Chief Complaint  Patient presents with   Aortic Stenosis    First TAVR consultation    HPI:  The patient is a 84 year old gentleman with hypertension, hyperlipidemia, PTSD, severe aortic stenosis, cerebrovascular disease status post right brain stroke on 10/09/2017, and coronary artery disease status post PCI of the RCA with a DES on 11/04/2017.  He had known severe aortic stenosis with an echocardiogram on 04/12/2014 showing a mean gradient of 45 mmHg with a dimensionless index of 0.22.  Left ventricular ejection fraction was 60 to 65%.  He was apparently asymptomatic at that time and did not have a follow-up echocardiogram until he presented on 10/09/2017 with an acute right brain stroke with left hemiparesis.  He said that he was watching television and started feeling numbness and weakness in his left arm and became confused.  He stood up and tried to walk and fell to the floor.  He said he may have lost consciousness briefly.  He said that he could not move to get to a phone and was lying on the floor for 2 days before a family member found him.  A CT of the brain showed a right parietal lobe infarct and CTA of the brain showed a 70 to 80% atheromatous narrowing at the right ICA bulb.  Subsequent MR of the brain on 10/26/2017 showed an evolving subacute right parietal infarction.  The patient was seen by vascular surgery and it was felt that he may need a right carotid endarterectomy at some point.  A follow-up echocardiogram on 10/10/2017 showed a trileaflet aortic valve with moderate thickening and severe calcification with restricted leaflet mobility.  The mean gradient was 71 mmHg with peak of 111 mmHg.  Dimensionless index was 0.14 with a  valve area of 0.43 cm.  Left ventricular ejection fraction was 60 to 65%.  He was evaluated by Dr. Clifton James and underwent cardiac catheterization on 11/04/2017.  This showed three-vessel coronary disease.  The proximal to mid LAD had 70% stenosis.  There is moderate disease in the left circumflex and first marginal branch.  There is a 95% mid RCA stenosis that was treated with a drug-eluting stent.  The mean gradient across aortic valve cath was 59.5 mmHg with a calculated aortic valve area of 0.8 cm.  The plan was to maintain him on dual antiplatelet therapy and allow him to recover from his stroke prior to considering transcatheter aortic valve replacement.  He has been taking Plavix but apparently has not been taking aspirin although it is listed on his medication sheet.  He was discharged from the hospital to a skilled nursing rehab but is now at home.  He is walking around his house without difficulty.  He does use a cane or walker for stabilization.  He denies any chest pain.  He has had fatigue if he exerts himself and has noted decreased energy.  He has had some dizziness.  This is particularly with position changes.  He denies any shortness of breath or lower extremity edema.  The patient is here with his nephew today.  He is living at home independently but has family members who help him.  He has been ambulatory.  He was driving prior to his stroke but has not  returned to driving.  Prior to his stroke he had been very active.  Past Medical History:  Diagnosis Date   Acute blood loss anemia    Acute CVA (cerebrovascular accident) (HCC) 10/09/2017   Anxiety    Aortic stenosis, severe    for TAVR   CAD (coronary artery disease)    a. 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 side; hurt when I put it down; had to have nerves clipped"   EYE SURGERY     IR RADIOLOGY PERIPHERAL GUIDED IV START  11/23/2017   IR US GUIDE VASC ACCESS RIGHT  11/23/2017   RIGHT/LEFT HEART CATH AND CORONARY ANGIOGRAPHY N/A 11/04/2017   Procedure: RIGHT/LEFT HEART CATH AND CORONARY ANGIOGRAPHY;  Surgeon: Kathleene Hazel, MD;  Location: [facility];  Service: Cardiovascular;  Laterality: N/A;    Family History  Problem Relation Age of Onset   Hypertension Other     Social History   Socioeconomic History   Marital status: Divorced    Spouse name: n/a   Number of children: 2   Years of education: Not on file   Highest education level: Not on file  Occupational History   Occupation: retired  Ecologist strain: Not on file   Food insecurity:    Worry: Not on file    Inability: Not on Occupational hygienist needs:    Medical: Not on file    Non-medical: Not on file  Tobacco Use   Smoking status: Former Smoker    Years: 2.00    Types: Cigarettes   Smokeless tobacco: Never Used   Tobacco comment: "none since 1980s" (11/04/2017)  Substance and Sexual Activity   Alcohol use: No    Comment: none since 1980   Drug use: Never   Sexual activity: Yes    Partners: Female    Lifestyle   Physical activity:    Days per week: Not on file    Minutes per session: Not on file   Stress: Not on file  Relationships   Social connections:    Talks on phone: Not on file    Gets together: Not on file    Attends religious service: Not on file    Active member of club or organization: Not on file    Attends meetings of clubs or organizations: Not on file    Relationship status: Not on file   Intimate partner violence:    Fear of current or ex partner: Not on  file    Emotionally abused: Not on file    Physically abused: Not on file    Forced sexual activity: Not on file  Other Topics Concern   Not on file  Social History Narrative   Divorced in 76. 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  His grandson is a Optometrist.    Current Outpatient Medications  Medication Sig Dispense Refill   aspirin 81 MG chewable tablet Chew 1 tablet (81 mg total) by mouth daily. 60 tablet 3   nitroGLYCERIN (NITROSTAT) 0.4 MG SL tablet Place 1 tablet (0.4 mg total) under the tongue every 5 (five) minutes as needed for chest pain. 25 tablet 0   clopidogrel (PLAVIX) 75 MG tablet Take 1 tablet (75 mg total) by mouth daily with breakfast. 90 tablet 3   docusate sodium (COLACE) 100 MG capsule Take 1 capsule (100 mg total) by mouth daily as needed for mild constipation. 90 capsule 3   levETIRAcetam (KEPPRA) 500 MG tablet Take 1 tablet (500 mg total) by mouth 2 (two) times daily. 180 tablet 3   traZODone (DESYREL) 100 MG tablet Take 1 tablet (100 mg total) by mouth at bedtime. 90 tablet 3   No current facility-administered medications for this visit.     Allergies  Allergen Reactions   Statins Other (See Comments)    MYALGIAS      Review of Systems:   General:  normal appetite, decreased energy, no weight gain, no weight loss, no fever  Cardiac:  no chest pain with exertion, no chest pain at rest, no SOB with  exertion, no resting SOB, no PND, no orthopnea, no palpitations, no arrhythmia, no  atrial fibrillation, no LE edema, occasional dizzy spells, no syncope  Respiratory:  no shortness of breath, no home oxygen, no productive cough, no dry cough, no bronchitis, no wheezing, no hemoptysis, no asthma, no pain with inspiration or cough, no sleep apnea, no CPAP at night  GI:   no difficulty swallowing, no reflux, no frequent heartburn, no hiatal hernia, no abdominal pain, no constipation, no diarrhea, no hematochezia, no hematemesis, no melena  GU:   no dysuria,  no frequency, no urinary tract infection, no hematuria, no enlarged prostate, no kidney stones, no kidney disease  Vascular:  no pain suggestive of claudication, no pain in feet, no leg cramps, no varicose veins, no DVT, no non-healing foot ulcer  Neuro:   Recent stroke, no TIA's, no seizures, no headaches, no temporary blindness one eye,  no slurred speech, no peripheral neuropathy, no chronic pain, some instability of gait, no memory/cognitive dysfunction  Musculoskeletal: no arthritis, no joint swelling, no myalgias, no difficulty walking, reduced mobility   Skin:   no rash, no itching, no skin infections, no pressure sores or ulcerations  Psych:   no anxiety, no depression, no nervousness, no unusual recent stress  Eyes:   + blurry vision, + floaters, + recent vision changes, + wears glasses or contacts  ENT:   no hearing loss, no loose or painful teeth, wears dentures  Hematologic:  no easy bruising, no abnormal bleeding, no clotting disorder, no frequent epistaxis  Endocrine:  no diabetes, does not check CBG's at home       Physical Exam:   BP 114/66 (BP Location: Right Arm, Patient Position: Sitting, Cuff Size: Normal)    Pulse 77    Ht 5\' 8"  (1.727 m)    Wt 174 lb (78.9 kg)    SpO2 98% Comment: RA   BMI 26.46 kg/m   General:  Elderly, somewhat frail-appearing  HEENT:  Unremarkable, NCAT, PERLA, EOMI, oropharynx clear  Neck:   no JVD, no bruits, no adenopathy or thyromegaly  Chest:   clear to auscultation,  symmetrical breath sounds, no wheezes, no rhonchi   CV:   RRR, grade III/VI crescendo/decrescendo murmur heard best at RSB,  no diastolic murmur  Abdomen:  soft, non-tender, no masses or organomegaly  Extremities:  warm, well-perfused, pulses diminished in feet, no LE edema  Rectal/GU  Deferred  Neuro:   Strength in upper extremities seems symmetrical. He has mild LLE weakness and is dragging the left leg a little with ambulation.  Skin:   Clean and dry, no rashes, no breakdown   Diagnostic Tests:            *[place]*                   *[facility]*                         [address]                        [place], [place] [address]                            [phone]  ------------------------------------------------------------------- Transthoracic Echocardiography  Patient:    Demetres, Prochnow MR #:       [id number] Study Date: 10/10/2017 Gender:     M Age:        89 Height:     172.7 cm Weight:     81.6 kg BSA:        2 m^2 Pt. Status: Room:       3W05C   ADMITTING    Ara Kussmaul  SONOGRAPHER  [address]  ATTENDING    Tilden Fossa [id number]  PERFORMING   [facility], Inpatient  cc:  ------------------------------------------------------------------- LV EF: 60% -   65%  ------------------------------------------------------------------- Indications:      Syncope 780.2.  ------------------------------------------------------------------- History:   PMH:  Rhabdomyosis, Elevated Troponin. PTSD.  Syncope. Stroke.  Risk factors:  Dyslipidemia.  ------------------------------------------------------------------- Study Conclusions  - Left ventricle: The cavity size was normal. Wall thickness was   increased in a pattern of moderate LVH. Systolic function was   normal. The estimated ejection fraction was in the range of 60%   to 65%. Wall motion was  normal; there were no regional wall   motion abnormalities. Doppler parameters are consistent with   abnormal left ventricular relaxation (grade 1 diastolic   dysfunction). - Aortic valve: Valve mobility was moderately restricted.   Transvalvular velocity was increased. There was critical   stenosis. Valve area (VTI): 0.41 cm^2. Valve area (Vmax): 0.43   cm^2. Valve area (Vmean): 0.4 cm^2. - Mitral valve: Mildly calcified annulus.  ------------------------------------------------------------------- Study data:  Comparison was made to the study of 04/12/2014.  Study status:  Routine.  Procedure:  Transthoracic echocardiography. Image quality was adequate.  Study completion:  There were no complications.          Transthoracic echocardiography.  M-mode, complete 2D, spectral Doppler, and color Doppler.  Birthdate: Patient birthdate: June 24, 1924.  Age:  Patient is 84 yr old.  Sex: Gender: male.    BMI: 27.4 kg/m^2.  Blood pressure:  115/86 Patient status:  Inpatient.  Study date:  Study date: 10/10/2017. Study time: 02:43 PM.  Location:  Bedside.  -------------------------------------------------------------------  ------------------------------------------------------------------- Left ventricle:  The cavity size was normal. Wall thickness was increased in a pattern of moderate LVH. Systolic function was normal. The estimated ejection fraction was in the range of 60% to 65%. Wall motion was normal; there were no regional wall motion abnormalities. Doppler parameters are consistent with abnormal left ventricular relaxation (grade 1 diastolic dysfunction).  ------------------------------------------------------------------- Aortic valve:   Trileaflet; moderately thickened, severely calcified leaflets. Valve mobility was moderately restricted. Doppler:  Transvalvular velocity was increased. There was critical stenosis. There was no regurgitation.    VTI ratio of LVOT to aortic  valve: 0.13. Valve area (VTI): 0.41 cm^2. Indexed valve area (VTI): 0.21 cm^2/m^2. Peak velocity ratio of LVOT to aortic valve: 0.14. Valve area (Vmax): 0.43 cm^2. Indexed valve area (Vmax): 0.22 cm^2/m^2. Mean velocity ratio of LVOT to aortic valve: 0.13. Valve area (Vmean): 0.4 cm^2. Indexed valve area (Vmean): 0.2 cm^2/m^2.  Mean gradient (S): 71 mm Hg. Peak gradient (S): 111 mm Hg.  ------------------------------------------------------------------- Aorta:  Aortic root: The aortic root was normal in size.  ------------------------------------------------------------------- Mitral valve:   Mildly calcified annulus. Mobility was not restricted.  Doppler:  Transvalvular velocity was within the normal range. There was no evidence for stenosis. There was no regurgitation.    Valve area by pressure half-time: 3.24 cm^2. Indexed valve area by pressure half-time: 1.62 cm^2/m^2.    Peak gradient (D): 2 mm Hg.  ------------------------------------------------------------------- Left atrium:  The atrium was normal in size.  ------------------------------------------------------------------- Right ventricle:  The cavity size was normal. Wall thickness was normal. Systolic function was normal.  ------------------------------------------------------------------- Pulmonic valve:    The valve appears to be grossly normal. Doppler:  Transvalvular velocity was within the normal range. There was no evidence for stenosis.  ------------------------------------------------------------------- Tricuspid valve:   Structurally normal valve.    Doppler: Transvalvular velocity was within the normal range. There was no regurgitation.  ------------------------------------------------------------------- Right atrium:  The atrium was normal in size.  ------------------------------------------------------------------- Pericardium:  There was no pericardial  effusion.  ------------------------------------------------------------------- Systemic veins: Inferior vena cava: The vessel was normal in size.  ------------------------------------------------------------------- Measurements   Left ventricle                           Value          Reference  LV ID, ED, PLAX chordal          (L)     32    mm       43 - 52  LV ID, ES, PLAX chordal                  23    mm       23 - 38  LV fx shortening, PLAX chordal   (L)     28    %        >=29  LV PW thickness, ED                      14    mm       ----------  IVS/LV PW ratio, ED                      1              <=1.3  Stroke  volume, 2D                        53    ml       ----------  Stroke volume/bsa, 2D                    27    ml/m^2   ----------  LV e&', lateral                           7.18  cm/s     ----------  LV E/e&', lateral                         10.54          ----------  LV e&', medial                            6.31  cm/s     ----------  LV E/e&', medial                          12             ----------  LV e&', average                           6.75  cm/s     ----------  LV E/e&', average                         11.22          ----------    Ventricular septum                       Value          Reference  IVS thickness, ED                        14    mm       ----------    LVOT                                     Value          Reference  LVOT ID, S                               20    mm       ----------  LVOT area                                3.14  cm^2     ----------  LVOT peak velocity, S                    71.7  cm/s     ----------  LVOT mean velocity, S                    50.5  cm/s     ----------  LVOT VTI, S  16.8  cm       ----------    Aortic valve                             Value          Reference  Aortic valve peak velocity, S            527   cm/s     ----------  Aortic valve mean velocity, S            400   cm/s      ----------  Aortic valve VTI, S                      130   cm       ----------  Aortic mean gradient, S                  71    mm Hg    ----------  Aortic peak gradient, S                  111   mm Hg    ----------  VTI ratio, LVOT/AV                       0.13           ----------  Aortic valve area, VTI                   0.41  cm^2     ----------  Aortic valve area/bsa, VTI               0.21  cm^2/m^2 ----------  Velocity ratio, peak, LVOT/AV            0.14           ----------  Aortic valve area, peak velocity         0.43  cm^2     ----------  Aortic valve area/bsa, peak              0.22  cm^2/m^2 ----------  velocity  Velocity ratio, mean, LVOT/AV            0.13           ----------  Aortic valve area, mean velocity         0.4   cm^2     ----------  Aortic valve area/bsa, mean              0.2   cm^2/m^2 ----------  velocity    Aorta                                    Value          Reference  Aortic root ID, ED                       27    mm       ----------    Left atrium                              Value          Reference  LA ID, A-P, ES  41    mm       ----------  LA ID/bsa, A-P                           2.05  cm/m^2   <=2.2  LA volume, S                             52.7  ml       ----------  LA volume/bsa, S                         26.4  ml/m^2   ----------  LA volume, ES, 1-p A4C                   44.3  ml       ----------  LA volume/bsa, ES, 1-p A4C               22.2  ml/m^2   ----------  LA volume, ES, 1-p A2C                   57.9  ml       ----------  LA volume/bsa, ES, 1-p A2C               29    ml/m^2   ----------    Mitral valve                             Value          Reference  Mitral E-wave peak velocity              75.7  cm/s     ----------  Mitral A-wave peak velocity              104   cm/s     ----------  Mitral deceleration time         (H)     232   ms       150 - 230  Mitral pressure half-time                68    ms        ----------  Mitral peak gradient, D                  2     mm Hg    ----------  Mitral E/A ratio, peak                   0.7            ----------  Mitral valve area, PHT, DP               3.24  cm^2     ----------  Mitral valve area/bsa, PHT, DP           1.62  cm^2/m^2 ----------    Right atrium                             Value          Reference  RA ID, S-I, ES, A4C                      48.7  mm  34 - 49  RA area, ES, A4C                         16    cm^2     8.3 - 19.5  RA volume, ES, A/L                       45.3  ml       ----------  RA volume/bsa, ES, A/L                   22.7  ml/m^2   ----------    Systemic veins                           Value          Reference  Estimated CVP                            3     mm Hg    ----------    Right ventricle                          Value          Reference  TAPSE                                    27    mm       ----------  RV s&', lateral, S                        18    cm/s     ----------  Legend: (L)  and  (H)  mark values outside specified reference range.  ------------------------------------------------------------------- Prepared and Electronically Authenticated by  Georga Hacking, MD [facility] 2019-03-02T16:30:10  Physicians   Panel Physicians Referring Physician Case Authorizing Physician  Kathleene Hazel, MD (Primary)    Procedures   CORONARY STENT INTERVENTION  RIGHT/LEFT HEART CATH AND CORONARY ANGIOGRAPHY  Conclusion     Prox RCA-1 lesion is 30% stenosed.  Prox RCA-2 lesion is 95% stenosed.  Mid RCA to Dist RCA lesion is 20% stenosed.  Ost RPDA lesion is 30% stenosed.  Ost 3rd Mrg lesion is 50% stenosed.  Prox Cx to Mid Cx lesion is 30% stenosed.  Ost LAD to Prox LAD lesion is 70% stenosed.  Prox LAD-1 lesion is 70% stenosed.  Prox LAD-2 lesion is 70% stenosed.  Mid LAD lesion is 50% stenosed.  Mid LAD to Dist LAD lesion is 50% stenosed.  A drug-eluting stent was  successfully placed using a STENT SYNERGY DES 2.75X20.  Post intervention, there is a 0% residual stenosis.   1. Triple vessel CAD 2. Moderately severe, calcified stenosis in the proximal and mid LAD 3. Moderate disease in the Circumflex and first OM branch 4. The RCA is large dominant vessel with severe, heavily calcified stenosis in the mid vessel.  5. Successful PTCA/DES in the mid RCA 6. Severe aortic stenosis (mean gradient 59.5 mmHg, peak gradient 71 mmHg, AVA 0.8 cm2)  Recommendations: Will continue DAPT with ASA and Plavix. He is statin intolerant. Consider Praluent/Repatha. Will monitor overnight. Will continue planning for TAVR. Will plan medical therapy of his LAD.    Indications  Coronary artery disease involving native coronary artery of native heart with unstable angina pectoris (HCC) [I25.110 (ICD-10-CM)]  Severe aortic stenosis [I35.0 (ICD-10-CM)]  Procedural Details/Technique   Technical Details Indication: 84 yo male with h/o severe AS, severe carotid artery disease with recent CVA here today for cath as part of TAVR workup. Recent worsened dyspnea and chest pain.   Procedure: The risks, benefits, complications, treatment options, and expected outcomes were discussed with the patient. The patient and/or family concurred with the proposed plan, giving informed consent. The patient was brought to the cath lab after IV hydration was given. The patient was not sedated. The right groin was prepped and draped. I placed a 7 French sheath in the right femoral vein under u/s guidance. Right heart cath performed with a balloon tipped catheter. The right wrist was prepped and draped in a sterile fashion. 1% lidocaine was used for local anesthesia. Using the modified Seldinger access technique, a 5 French sheath was placed in the right radial artery. 3 mg Verapamil was given through the sheath. 4000 units IV heparin was given. Standard diagnostic catheters were used to perform selective  coronary angiography. I crossed the aortic valve with an AL-2 catheter and a straight wire. LV pressures measured.   PCI Note: IV heparin used for anti-coagulation. ACT over 300. Plavix 600 mg po x 1. I engaged the RCA with an AL-1 guiding catheter and a Cougar IC wire was advanced down the RCA. I then used a 2.5 x 12 mm balloon to pre-dilate the stenosis in the mid RCA. I then deployed a 2.75 x 20 mm Synergy DES in the mid RCA. The stent was post-dilated with a 3.0 x 12 mm Yale balloon x 2. The stenosis was taken from 95% down to 0%. TIMI 3 flow pre and post stent.   The sheath was removed from the right radial artery and a Zephyr hemostasis band was applied at the arteriotomy site on the right wrist.     Estimated blood loss <50 mL.  During this procedure no sedation was administered.  Complications   Complications documented before study signed (11/04/2017 10:19 AM EDT)      Log Level Complications   None Documented by Kathleene Hazel, MD 11/04/2017 10:13 AM EDT  Time Range: Intraprocedure    Coronary Findings   Diagnostic  Dominance: Right  Left Anterior Descending  Vessel is large.  Ost LAD to Prox LAD lesion 70% stenosed  Ost LAD to Prox LAD lesion is 70% stenosed. The lesion is calcified.  Prox LAD-1 lesion 70% stenosed  Prox LAD-1 lesion is 70% stenosed.  Prox LAD-2 lesion 70% stenosed  Prox LAD-2 lesion is 70% stenosed.  Mid LAD lesion 50% stenosed  Mid LAD lesion is 50% stenosed.  Mid LAD to Dist LAD lesion 50% stenosed  Mid LAD to Dist LAD lesion is 50% stenosed.  Left Circumflex  Prox Cx to Mid Cx lesion 30% stenosed  Prox Cx to Mid Cx lesion is 30% stenosed.  Third Obtuse Marginal Branch  Ost 3rd Mrg lesion 50% stenosed  Ost 3rd Mrg lesion is 50% stenosed.  Right Coronary Artery  Vessel is large.  Prox RCA-1 lesion 30% stenosed  Prox RCA-1 lesion is 30% stenosed. The lesion is calcified.  Prox RCA-2 lesion 95% stenosed  Prox RCA-2 lesion is 95%  stenosed. The lesion is calcified.  Mid RCA to Dist RCA lesion 20% stenosed  Mid RCA to Dist RCA lesion is 20% stenosed.  Right Posterior Descending  Artery  Ost RPDA lesion 30% stenosed  Ost RPDA lesion is 30% stenosed.  Intervention   Prox RCA-2 lesion  Stent  Pre-stent angioplasty was performed using a BALLOON SAPPHIRE 2.5X12. A drug-eluting stent was successfully placed using a STENT SYNERGY DES 2.75X20. Post-stent angioplasty was performed using a BALLOON SAPPHIRE [place] 3.0X12.  Post-Intervention Lesion Assessment  The intervention was successful. Pre-interventional TIMI flow is 3. Post-intervention TIMI flow is 3. No complications occurred at this lesion.  There is no residual stenosis post intervention.  Right Heart   Right Heart Pressures Elevated LV EDP consistent with volume overload.  Coronary Diagrams   Diagnostic Diagram       Post-Intervention Diagram       Implants    Permanent Stent  Stent Synergy Des 2.75x20 - [id number] - Implanted    Inventory item: STENT SYNERGY DES 2.75X20 Model/Cat number: [id number]  Manufacturer: [employer] Lot number: [id number]  Device identifier: [id number] Device identifier type: GS1  Area Of Implantation: Mid RCA    GUDID Information   Request status Successful    Brand name: SYNERGY Version/Model: [id number]  Company name: [employer] MRI safety info as of 11/04/17: MR Conditional  Contains dry or latex rubber: No    GMDN P.T. name: Drug-eluting coronary artery stent, bioabsorbable-polymer-coated    As of 11/04/2017   Status: Implanted      MERGE Images   Show images for CARDIAC CATHETERIZATION   Link to Procedure Log   Procedure Log    Hemo Data    Most Recent Value  Fick Cardiac Output 6.07 L/min  Fick Cardiac Output Index 3.18 (L/min)/BSA  Aortic Mean Gradient 59.5 mmHg  Aortic Peak Gradient 71 mmHg  Aortic Valve Area 0.80  Aortic Value Area Index 0.42 cm2/BSA  RA  A Wave 8 mmHg  RA V Wave 6 mmHg  RA Mean 6 mmHg  RV Systolic Pressure 40 mmHg  RV Diastolic Pressure 8 mmHg  RV EDP 9 mmHg  PA Systolic Pressure 36 mmHg  PA Diastolic Pressure 17 mmHg  PA Mean 25 mmHg  PW A Wave 19 mmHg  PW V Wave 25 mmHg  PW Mean 17 mmHg  AO Systolic Pressure 154 mmHg  AO Diastolic Pressure 62 mmHg  AO Mean 96 mmHg  LV Systolic Pressure 230 mmHg  LV Diastolic Pressure 12 mmHg  LV EDP 26 mmHg  Arterial Occlusion Pressure Extended Systolic Pressure 168 mmHg  Arterial Occlusion Pressure Extended Diastolic Pressure 62 mmHg  Arterial Occlusion Pressure Extended Mean Pressure 103 mmHg  Left Ventricular Apex Extended Systolic Pressure 239 mmHg  Left Ventricular Apex Extended Diastolic Pressure 11 mmHg  Left Ventricular Apex Extended EDP Pressure 26 mmHg  QP/QS 1  TPVR Index 7.86 HRUI  TSVR Index 30.19 HRUI  PVR SVR Ratio 0.09  TPVR/TSVR Ratio 0.26   ADDENDUM REPORT: 11/23/2017 15:23  CLINICAL DATA:  Aortic stenosis  EXAM: Cardiac TAVR CT  TECHNIQUE: The patient was scanned on a Siemens Force 192 slice scanner. A 120 kV retrospective scan was triggered in the descending thoracic aorta at 111 HU's. Gantry rotation speed was 270 msecs and collimation was .9 mm. No beta blockade or nitro were given. The 3D data set was reconstructed in 5% intervals of the R-R cycle. Systolic and diastolic phases were analyzed on a dedicated work station using MPR, MIP and VRT modes. The patient received 80 cc of contrast.  FINDINGS: Aortic Valve: Tri-leaflet and severely calcified with restricted motion  Aorta: No aneurysm  with moderate calcific atherosclerosis normal arch vessels and horizontal annulus  Sinotubular Junction: 23 mm  Ascending Thoracic Aorta: 34 mm  Aortic Arch: 27 mm  Descending Thoracic Aorta: 25 mm  Sinus of Valsalva Measurements:  Non-coronary: 30 mm  Right - coronary: 27.5 mm  Left - coronary: 28.9 mm  Coronary Artery  Height above Annulus:  Left Main: 12 mm above annulus  Right Coronary: 12.7 mm above annulus  Virtual Basal Annulus Measurements:  Maximum/Minimum Diameter: Circular annulus 21.6 mm x 20.9 mm  Perimeter: 70.4 mm  Area: 357 mm2  Coronary Arteries: Sufficient height above annulus for deployment  Optimum Fluoroscopic Angle for Delivery: LAO 21 degrees Caudal 4 degrees  IMPRESSION: 1. Calcified tri leaflet AV with annulus of 357 mm2 suitable for a 23 mm Sapien 3 valve  2.  Normal aortic root 3.4 cm  3.  Coronary Arteries sufficient height above annulus for deployment  4. Optimum angiographic angle for deployment LAO 21 degrees Caudal 4 degrees  5.  No LAA thrombus  6.  Patent stent in mid RCA  Charlton Haws   Electronically Signed   By: Charlton Haws M.D.   On: 11/23/2017 15:23   CLINICAL DATA:  84 year old male with history of severe aortic stenosis. Preprocedural study prior to potential transcatheter aortic valve replacement (TAVR) procedure.  EXAM: CT ANGIOGRAPHY CHEST, ABDOMEN AND PELVIS  TECHNIQUE: Multidetector CT imaging through the chest, abdomen and pelvis was performed using the standard protocol during bolus administration of intravenous contrast. Multiplanar reconstructed images and MIPs were obtained and reviewed to evaluate the vascular anatomy.  CONTRAST:  ISOVUE-370 IOPAMIDOL (ISOVUE-370) INJECTION 76%  COMPARISON:  None.  FINDINGS: CTA CHEST FINDINGS  Cardiovascular: Heart size is mildly enlarged. There is no significant pericardial fluid, thickening or pericardial calcification. There is aortic atherosclerosis, as well as atherosclerosis of the great vessels of the mediastinum and the coronary arteries, including calcified atherosclerotic plaque in the left main, left anterior descending, left circumflex and right coronary arteries. Severe thickening calcification of the aortic valve. Mild calcifications  of the mitral valve and subvalvular apparatus.  Mediastinum/Lymph Nodes: No pathologically enlarged mediastinal or hilar lymph nodes. Esophagus is unremarkable in appearance. No axillary lymphadenopathy.  Lungs/Pleura: Trace left pleural effusion lying dependently. Areas of scarring and/or subsegmental atelectasis in the left lung base. No acute consolidative airspace disease. No pleural effusions. No suspicious appearing pulmonary nodules or masses.  Musculoskeletal/Soft Tissues: There are no aggressive appearing lytic or blastic lesions noted in the visualized portions of the skeleton.  CTA ABDOMEN AND PELVIS FINDINGS  Hepatobiliary: Multiple small low-attenuation lesions throughout the liver, largest of which measures 1.4 cm in segment 7 (axial image 96 of series 15), compatible with a simple cyst. The smaller lesions are too small to definitively characterize, but are statistically likely tiny cysts or biliary hamartomas. No intra or extrahepatic biliary ductal dilatation. Gallbladder is normal in appearance.  Pancreas: No pancreatic mass. No pancreatic ductal dilatation. No pancreatic or peripancreatic fluid or inflammatory changes.  Spleen: Unremarkable.  Adrenals/Urinary Tract: 5 mm nonobstructive calculus in the lower pole collecting system of the right kidney. Several low-attenuation lesions in both kidneys, most of which are too small to characterize, but favored to represent tiny cysts. The largest of these is an exophytic simple cyst in the upper pole of the left kidney measuring 1.6 cm in diameter. No definite suspicious renal lesions. Bilateral adrenal glands are normal in appearance. No hydroureteronephrosis. Diverticulum measuring 2.9 cm from the superior aspect of  the urinary bladder incidentally noted.  Stomach/Bowel: Normal appearance of the stomach. No pathologic dilatation of small bowel or colon. Normal appendix.  Vascular/Lymphatic: Aortic  atherosclerosis, without evidence of dissection in the abdominal or pelvic vasculature. Vascular findings and measurements pertinent to potential TAVR procedure, as detailed below. No lymphadenopathy noted in the abdomen or pelvis.  Reproductive: Prostate gland is enlarged and heterogeneous in appearance with severe median lobe hypertrophy measuring approximately 4.5 x 5.5 x 8.5 cm. Seminal vesicles are unremarkable in appearance.  Other: No significant volume of ascites.  No pneumoperitoneum.  Musculoskeletal: Levoscoliosis of the lumbar spine with multilevel degenerative disc disease and facet arthropathy. There are no aggressive appearing lytic or blastic lesions noted in the visualized portions of the skeleton.  VASCULAR MEASUREMENTS PERTINENT TO TAVR:  AORTA:  Minimal Aortic Diameter-10 x 13 mm  Severity of Aortic Calcification-severe  RIGHT PELVIS:  Right Common Iliac Artery -  Minimal Diameter-6.0 x 5.4 mm  Tortuosity-mild  Calcification-severe  Right External Iliac Artery -  Minimal Diameter-6.9 x 4.3 mm  Tortuosity-moderate  Calcification-moderate  Right Common Femoral Artery -  Minimal Diameter-7.4 x 6.5 mm  Tortuosity-mild  Calcification-severe severe  LEFT PELVIS:  Left Common Iliac Artery -  Minimal Diameter-6.4 x 2.9 mm  Tortuosity-mild  Calcification -  Left External Iliac Artery -  Minimal Diameter-4.7 x 3.0 mm  Tortuosity-mild  Calcification-moderate  Left Common Femoral Artery -  Minimal Diameter-4.7 x 4.5 mm  Tortuosity-mild  Calcification-severe  Review of the MIP images confirms the above findings.  IMPRESSION: 1. Vascular findings and measurements pertinent to potential TAVR procedure, as detailed above. 2. Severe thickening calcification of the aortic valve, compatible with the reported clinical history of severe aortic stenosis. 3. Aortic atherosclerosis, in addition to left  main and 3 vessel coronary artery disease. 4. Trace left pleural effusion lying dependently. 5. 5 mm nonobstructive calculus in the lower pole collecting system of the right kidney. 6. Additional incidental findings, as above.  Aortic Atherosclerosis (ICD10-I70.0).   Electronically Signed   By: Trudie Reed M.D.   On: 11/23/2017 15:30   [facility] Adult Cardiac Surgery Database Version 2.9 RISK SCORES Procedure: AVR + CAB CALCULATE  Risk of Mortality:  5.704%   Renal Failure:  6.140%   Permanent Stroke:  5.337%   Prolonged Ventilation:  19.352%   DSW Infection:  0.322%   Reoperation:  9.159%   Morbidity or Mortality:  30.474%   Short Length of Stay:  5.735%   Long Length of Stay:  24.451%    Impression:  This 84 year old gentleman has stage D, critical, symptomatic aortic stenosis with New York Heart Association class II symptoms of exertional fatigue and tiredness consistent with chronic diastolic congestive heart failure.  He presented with an acute ischemic right brain stroke on 10/09/2017 which is likely due to a combination of a 70 to 80% right carotid bulb stenosis in addition to critical aortic stenosis.  He has made a good recovery from that and is now home and independent.  In addition he also has three-vessel coronary disease and underwent PCI stenting of a high-grade mid right coronary stenosis on 11/04/2017.  He denies any anginal symptoms at this time.  He denies any exertional shortness of breath but is not that active at this time.  He has been taking Plavix but did not take aspirin for some reason although it was listed as 1 of his medications.  I instructed him to resume the aspirin in addition to the Plavix.  I have personally reviewed his echocardiogram, cardiac catheterization, and CTA studies.  His echocardiogram shows a trileaflet aortic valve with moderate thickening and severely calcified leaflets with restricted mobility.  The mean gradient is 71 mmHg consistent  with critical aortic stenosis.  Left ventricular systolic function is normal with grade 1 diastolic dysfunction.  Cardiac catheterization showed three-vessel coronary disease with a high-grade right coronary stenosis that was treated successfully with a drug-eluting stent.  I agree that aortic valve replacement is indicated in this patient with critical aortic stenosis.  Although he is 84 years old he is still independent at home after recovering from the recent stroke and prior to that was very active.  I think he would be at moderate to high risk for open surgical aortic valve replacement given his age and recent stroke.  I think transcatheter aortic valve replacement would be a reasonable alternative for him with much less risk and a much quicker recovery.  His gated cardiac CTA shows anatomy suitable for transcatheter aortic valve replacement without any significant complicating features.  His abdominal and pelvic CTA shows diffuse calcific atherosclerosis involving the abdominal aorta and iliofemoral vessels.  There is a high-grade stenosis of the left common iliac artery which is probably large enough for diagnostic catheter but not for valve insertion.  The right iliofemoral system looks like it may be suitable for transcatheter valve replacement using a 14 French sheath.  The left axillary artery also looks like it would be suitable if needed.  The patient and his nephew were counseled at length regarding treatment alternatives for management of severe symptomatic aortic stenosis. The risks and benefits of surgical intervention have been discussed in detail. Long-term prognosis with medical therapy was discussed. Alternative approaches such as conventional surgical aortic valve replacement, transcatheter aortic valve replacement, and palliative medical therapy were compared and contrasted at length. This discussion was placed in the context of the patient's own specific clinical presentation and past  medical history. All of their questions been addressed. The patient is eager to proceed with surgical management as soon as possible.   The patient has been advised of a variety of complications that might develop including but not limited to risks of death, stroke, paravalvular leak, aortic dissection or other major vascular complications, aortic annulus rupture, device embolization, cardiac rupture or perforation, mitral regurgitation, acute myocardial infarction, arrhythmia, heart block or bradycardia requiring permanent pacemaker placement, congestive heart failure, respiratory failure, renal failure, pneumonia, infection, other late complications related to structural valve deterioration or migration, or other complications that might ultimately cause a temporary or permanent loss of functional independence or other long term morbidity. The patient provides full informed consent for the procedure as described and all questions were answered.  He understands he will need to return for a second surgical evaluation before we make a final decision about proceeding with transcatheter aortic valve replacement.    Plan:  The patient will return for a second surgical evaluation by Dr. Cornelius Moras before making a decision about proceeding with transcatheter aortic valve replacement.   I spent 60 minutes performing this consultation and > 50% of this time was spent face to face counseling and coordinating the care of this patient's critical aortic stenosis.    Alleen Borne, MD 12/09/2017

## 2017-12-09 NOTE — Therapy (Signed)
[facility] [address] [place], [place], [address] Phone: [phone]   Fax:  [phone]  Physical Therapy Evaluation  Patient Details  Name: Samuel Vega MRN: [id number] Date of Birth: May 16, 1924 Referring Provider: Dr. Ward Givens   Encounter Date: 12/09/2017  PT End of Session - 12/09/17 1136    Visit Number  1    PT Start Time  1015    PT Stop Time  1115    PT Time Calculation (min)  60 min    Equipment Utilized During Treatment  Gait belt    Activity Tolerance  Patient tolerated treatment well;No increased pain       Past Medical History:  Diagnosis Date  . Anxiety   . Aortic stenosis, severe    for TAVR  . CAD (coronary artery disease)    a. s/p prox RCA DES x1 11/04/17  . Cardiac murmur   . Chronic back pain   . Chronic lower back pain   . CVA (cerebral vascular accident) (HCC) 09/2017   "left sided weakness since" (11/04/2017)  . Depression   . ED (erectile dysfunction)   . HTN (hypertension)   . Hyperlipidemia   . Macular degeneration   . Myocardial infarction (HCC) 09/2017  . PTSD (post-traumatic stress disorder)   . PTSD (post-traumatic stress disorder)   . Seizure (HCC)   . 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 CATARACT EXTRACTION, BILATERAL Bilateral   . CORONARY STENT INTERVENTION N/A 11/04/2017   Procedure: CORONARY STENT INTERVENTION;  Surgeon: Kathleene Hazel, MD;  Location: [facility] INVASIVE CV LAB;  Service: Cardiovascular;  Laterality: N/A;  . ELBOW SURGERY  1980s   "? side; hurt when I put it down; had to have nerves clipped"  . EYE SURGERY    . IR RADIOLOGY PERIPHERAL GUIDED IV START  11/23/2017  . IR US GUIDE VASC ACCESS RIGHT  11/23/2017  . RIGHT/LEFT HEART CATH AND CORONARY ANGIOGRAPHY N/A 11/04/2017   Procedure: RIGHT/LEFT HEART CATH AND CORONARY ANGIOGRAPHY;  Surgeon:  Kathleene Hazel, MD;  Location: [facility] INVASIVE CV LAB;  Service: Cardiovascular;  Laterality: N/A;    There were no vitals filed for this visit.   Subjective Assessment - 12/09/17 1026    Subjective  Pt and nephew present for evaluation. Pt has CVA 10/09/17 and has recently been dc'd home from SNF. No therapy at this time. Pt reports no symptoms of heart valve issue prior to CVA. Pt/nephew report patient did not sleep well last night and his deficits appear worse today. They also report this gap in rehab has slowed his progress in recovery.     Patient Stated Goals  live alone and care for self    Currently in Pain?  Yes    Pain Score  7     Pain Location  Back    Pain Orientation  Lower    Pain Descriptors / Indicators  Aching PT will monitor         [facility] PT Assessment - 12/09/17 0001      Assessment   Medical Diagnosis  severe aortic stenosis    Referring Provider  Dr. Ward Givens    Onset Date/Surgical Date  -- 10/09/17      Precautions   Precautions  Fall      Restrictions   Weight Bearing Restrictions  No  Balance Screen   Has the patient fallen in the past 6 months  No    Has the patient had a decrease in activity level because of a fear of falling?   No    Is the patient reluctant to leave their home because of a fear of falling?   No      Home Environment   Living Environment  Private residence    Living Arrangements  Alone    Home Access  Ramped entrance    Home Layout  One level    Home Equipment  Walker - 2 wheels    Additional Comments  Family has arranged for care assist in the AMs but transportation for the hired individual has been an issue this week.       Prior Function   Level of Independence  Independent      Posture/Postural Control   Posture/Postural Control  Postural limitations    Postural Limitations  Forward head      ROM / Strength   AROM / PROM / Strength  AROM;Strength      AROM   Overall AROM Comments  grossly WNL       Strength   Overall Strength Comments  grossly 5/5 throughout R side, LUE 4/5, LLE 3/5    Strength Assessment Site  Hand    Right/Left hand  Right;Left    Right Hand Grip (lbs)  64 R hand dominant    Left Hand Grip (lbs)  60      Ambulation/Gait   Gait Comments  Pt ambulates with front wheeled RW with supervision for safety due to LLE incoordination and awareness. Gait distance is limited by 66% for age/gender.        [facility] Pre-Surgical Assessment - 12/09/17 0001    5 Meter Walk Test- trial 1  6 sec    5 Meter Walk Test- trial 2  8 sec.     5 Meter Walk Test- trial 3  6 sec.    5 meter walk test average  6.67 sec    4 Stage Balance Test tolerated for:   10 sec.    4 Stage Balance Test Position  1    Comment  unable without UE assist    ADL/IADL Needs Assistance with:  Bathing;Dressing;Meal prep;Yard work    ADL/IADL Freight forwarder Index  Moderately frail due to recent CVA    6 Minute Walk- Baseline  yes    BP (mmHg)  101/63    HR (bpm)  77    02 Sat (%RA)  99 %    Modified Borg Scale for Dyspnea  0- Nothing at all    Perceived Rate of Exertion (Borg)  6-    6 Minute Walk Post Test  yes    BP (mmHg)  137/73    HR (bpm)  76    02 Sat (%RA)  97 %    Modified Borg Scale for Dyspnea  0- Nothing at all    Perceived Rate of Exertion (Borg)  11- Fairly light    Aerobic Endurance Distance Walked  465              Objective measurements completed on examination: See above findings.              PT Education - 12/09/17 1144    Education provided  Yes    Education Details  fall risk, HHPT, assistance at home    Person(s) Educated  Patient;Caregiver(s)    Methods  Explanation    Comprehension  Verbalized understanding                  Plan - 12/09/17 1144    Clinical Impression Statement  see below    PT Frequency  One time visit    Consulted and Agree with Plan of Care  Family member/caregiver;Patient      Clinical Impression Statement: Pt is a 84 yo  male presenting to OP PT for evaluation prior to possible TAVR surgery due to severe aortic stenosis. Pt with recent CVA (10/09/17) with residual L sided weakness/incoordination/inattention. Pt presents with good R sided ROM and strength, poor balance and is at high fall risk 4 stage balance test, slow walking speed and poor aerobic endurance per 6 minute walk test. Pt ambulated a total of 465 feet in 6 minute walk. BP increased significantly with 6 minute walk test. Based on the Short Physical Performance Battery, patient has a frailty rating of 4/12 with </= 5/12 considered frail. Pt will benefit from ongoing PT to address his remaining deficits from CVA. Recommend HHPT once cleared from a cardiac standpoint. Will need a PT referral from MD to address CVA when appropriate. Pt is living alone but would benefit from 24/7 assist at this point for safety. Nephew aware and is working to arrange more care as patient allows. Pt strongly wishes to remain independent and wants to drive his own care. Up until recent CVA, pt was very independent.   Patient demonstrated the following deficits and impairments:     Visit Diagnosis: Other abnormalities of gait and mobility  Muscle weakness (generalized)     Problem List Patient Active Problem List   Diagnosis Date Noted  . HTN (hypertension) 11/05/2017  . S/P right coronary artery (RCA) stent placement 11/05/2017  . Unstable angina (HCC) 11/04/2017  . Coronary artery disease involving native coronary artery of native heart with unstable angina pectoris (HCC)   . Severe aortic stenosis   . Seizure (HCC) 10/26/2017  . Dementia without behavioral disturbance   . Leukocytosis   . Acute blood loss anemia   . Acute CVA (cerebrovascular accident) (HCC) 10/09/2017  . Elevated troponin 10/09/2017  . Rhabdomyolysis 10/09/2017  . Fall   . Syncope   . Carotid stenosis, right   . Cerebrovascular accident (CVA) due to embolism of right anterior cerebral artery  (HCC)   . Loss of weight 03/20/2014  . Chronic back pain   . Hyperlipidemia 07/29/2012  . Hearing loss 07/29/2012  . Macular degeneration   . Tinnitus   . ED (erectile dysfunction)   . PTSD (post-traumatic stress disorder)   . Aortic stenosis, severe 09/11/2009    NICOLETTA,DANA, PT 12/09/2017, 11:46 AM  [facility] [address] [place], [place], [address] Phone: [phone]   Fax:  [phone]  Name: Samuel Vega MRN: [id number] Date of Birth: [birth year]

## 2017-12-09 NOTE — Patient Instructions (Addendum)
Contact Ria Comment at [facility]iner [phone], to help navigate finding resources you qualify for, including your veteran's benefits.  INCREASE the trazodone from 50 mg to 100 mg at bedtime to help sleep.    IF you received an x-ray today, you will receive an invoice from [facility]. Please contact [facility] at [phone] with questions or concerns regarding your invoice.   IF you received labwork today, you will receive an invoice from [facility]. Please contact [facility] at [phone] with questions or concerns regarding your invoice.   Our billing staff will not be able to assist you with questions regarding bills from these companies.  You will be contacted with the lab results as soon as they are available. The fastest way to get your results is to activate your My Chart account. Instructions are located on the last page of this paperwork. If you have not heard from [place] regarding the results in 2 weeks, please contact this office.

## 2017-12-11 ENCOUNTER — Other Ambulatory Visit: Payer: Self-pay | Admitting: *Deleted

## 2017-12-11 ENCOUNTER — Telehealth: Payer: Self-pay | Admitting: Physician Assistant

## 2017-12-11 DIAGNOSIS — I25110 Atherosclerotic heart disease of native coronary artery with unstable angina pectoris: Secondary | ICD-10-CM

## 2017-12-11 DIAGNOSIS — Z48812 Encounter for surgical aftercare following surgery on the circulatory system: Secondary | ICD-10-CM | POA: Diagnosis not present

## 2017-12-11 DIAGNOSIS — F4310 Post-traumatic stress disorder, unspecified: Secondary | ICD-10-CM | POA: Diagnosis not present

## 2017-12-11 DIAGNOSIS — E785 Hyperlipidemia, unspecified: Secondary | ICD-10-CM | POA: Diagnosis not present

## 2017-12-11 DIAGNOSIS — R1312 Dysphagia, oropharyngeal phase: Secondary | ICD-10-CM | POA: Diagnosis not present

## 2017-12-11 DIAGNOSIS — Z955 Presence of coronary angioplasty implant and graft: Secondary | ICD-10-CM | POA: Diagnosis not present

## 2017-12-11 DIAGNOSIS — G4700 Insomnia, unspecified: Secondary | ICD-10-CM

## 2017-12-11 DIAGNOSIS — I69318 Other symptoms and signs involving cognitive functions following cerebral infarction: Secondary | ICD-10-CM | POA: Diagnosis not present

## 2017-12-11 DIAGNOSIS — M1612 Unilateral primary osteoarthritis, left hip: Secondary | ICD-10-CM | POA: Diagnosis not present

## 2017-12-11 DIAGNOSIS — R569 Unspecified convulsions: Secondary | ICD-10-CM

## 2017-12-11 DIAGNOSIS — K5900 Constipation, unspecified: Secondary | ICD-10-CM

## 2017-12-11 DIAGNOSIS — I2510 Atherosclerotic heart disease of native coronary artery without angina pectoris: Secondary | ICD-10-CM | POA: Diagnosis not present

## 2017-12-11 DIAGNOSIS — I63421 Cerebral infarction due to embolism of right anterior cerebral artery: Secondary | ICD-10-CM

## 2017-12-11 DIAGNOSIS — I350 Nonrheumatic aortic (valve) stenosis: Secondary | ICD-10-CM | POA: Diagnosis not present

## 2017-12-11 DIAGNOSIS — I10 Essential (primary) hypertension: Secondary | ICD-10-CM | POA: Diagnosis not present

## 2017-12-11 DIAGNOSIS — G40909 Epilepsy, unspecified, not intractable, without status epilepticus: Secondary | ICD-10-CM | POA: Diagnosis not present

## 2017-12-11 DIAGNOSIS — Z9181 History of falling: Secondary | ICD-10-CM | POA: Diagnosis not present

## 2017-12-11 MED ORDER — LEVETIRACETAM 500 MG PO TABS: 500.0000 mg | ORAL_TABLET | Freq: Two times a day (BID) | ORAL | 3 refills | Status: DC

## 2017-12-11 MED ORDER — CLOPIDOGREL BISULFATE 75 MG PO TABS: 75.0000 mg | ORAL_TABLET | Freq: Every day | ORAL | 3 refills | Status: DC

## 2017-12-11 MED ORDER — DOCUSATE SODIUM 100 MG PO CAPS: 100.0000 mg | ORAL_CAPSULE | Freq: Every day | ORAL | 3 refills | Status: DC | PRN

## 2017-12-11 MED ORDER — TRAZODONE HCL 100 MG PO TABS: 100.0000 mg | ORAL_TABLET | Freq: Every day | ORAL | 3 refills | Status: DC

## 2017-12-11 NOTE — Telephone Encounter (Signed)
Copied from CRM [id number]. Topic: Quick Communication - See Telephone Encounter >> Dec 11, 2017 12:23 PM Landry Mellow wrote: CRM for notification. See Telephone encounter for: 12/11/17.  Pt said that his rx were sent to the wrong pharm. He needs them to go to the [place] in [place] phone is [phone] The meds are: clopidogrel (PLAVIX) 75 MG tablet docusate sodium (COLACE) 100 MG capsule levETIRAcetam (KEPPRA) 500 MG tablet

## 2017-12-11 NOTE — Telephone Encounter (Signed)
Phone call to [facility]. Agent at [facility] states that prescriptions from a non-VA provider need to be faxed to patient's primary care doctor at [facility] and re-written to be filled at [place]. [facility] P[facility] for patient is Dr. Carleene Cooper. Please fax requested medications and recent office visit note to [phone] attn to Dr. Jeri Lager.  Patient's last office visit incomplete. Provider, please finish documentation so clinic staff can refill patient's medication at [facility].

## 2017-12-11 NOTE — Telephone Encounter (Signed)
OV note and Rx faxed to Dr Jeri Lager [phone]

## 2017-12-11 NOTE — Patient Outreach (Signed)
[facility]) Care Management  12/11/2017  Samuel Vega 1923-10-15 [id number]   2nd attempt to contact member for transition of care.  Daughter in law, Samuel Vega, answers phone.  She verifies member's identity, states her husband who is listed as a contact person is not available at this time.  This care manager did not provide any specific details of member's case but instead listened to Nancy's concerns for member.  She state he lives alone and had personal care aides at discharge, however he is not allowing them to perform any of their duties and may have "fired" them by now.  She report the family is concerned about member's well being, but he is adamant about not needing assistance.  She state her husband, member's son [name], had member set up to be admitted to assisted living facility, but member changed his mind at the last minute.    Samuel Vega is made aware that all number's listed in chart for member belong to other family members.  She feel he will need more assistance but is unsure if member will agree.  This care manager inquired about a contact number for member, she state she will ask her husband and call this member back.  Will await call back, if no call back will follow within the next 4 business days.  Kemper Durie, [place], MSN [facility] Care Management  [facility] Manager [phone]

## 2017-12-11 NOTE — Telephone Encounter (Signed)
These were printed 2 days ago to be faxed. They were faxed to the pharmacy. I have added Dr. Jeri Lager to the patient's care team.  Reprinted and signed. Please fax to the number provided, attn: Dr. Jeri Lager  Meds ordered this encounter  Medications  . clopidogrel (PLAVIX) 75 MG tablet    Sig: Take 1 tablet (75 mg total) by mouth daily with breakfast.    Dispense:  90 tablet    Refill:  3    Order Specific Question:   Supervising Provider    Answer:   Clelia Croft, EVA N [[id number]]  . docusate sodium (COLACE) 100 MG capsule    Sig: Take 1 capsule (100 mg total) by mouth daily as needed for mild constipation.    Dispense:  90 capsule    Refill:  3    Order Specific Question:   Supervising Provider    Answer:   SHAW, EVA N [[id number]]  . levETIRAcetam (KEPPRA) 500 MG tablet    Sig: Take 1 tablet (500 mg total) by mouth 2 (two) times daily.    Dispense:  180 tablet    Refill:  3    Order Specific Question:   Supervising Provider    Answer:   Clelia Croft, EVA N [[id number]]  . traZODone (DESYREL) 100 MG tablet    Sig: Take 1 tablet (100 mg total) by mouth at bedtime.    Dispense:  90 tablet    Refill:  3    Order Specific Question:   Supervising Provider    Answer:   Clelia Croft, EVA N [[id number]]

## 2017-12-14 ENCOUNTER — Telehealth: Payer: Self-pay

## 2017-12-14 NOTE — Telephone Encounter (Signed)
Please look into this. Notes stating these were faxed.   Copied from CRM [id number]. Topic: General - Other >> Dec 11, 2017 12:41 PM Gerrianne Scale wrote: Reason for CRM: patient calling [facility] [place] [facility] [phone] they was sent to wrong pharmacy these are the medicines so that it can be mailed to him                                                               clopidogrel (PLAVIX) 75 MG tablet    docusate sodium (COLACE) 100 MG capsule   levETIRAcetam (KEPPRA) 500 MG tablet

## 2017-12-15 ENCOUNTER — Telehealth: Payer: Self-pay | Admitting: Physician Assistant

## 2017-12-15 DIAGNOSIS — R569 Unspecified convulsions: Secondary | ICD-10-CM

## 2017-12-15 DIAGNOSIS — I63421 Cerebral infarction due to embolism of right anterior cerebral artery: Secondary | ICD-10-CM

## 2017-12-15 DIAGNOSIS — I25110 Atherosclerotic heart disease of native coronary artery with unstable angina pectoris: Secondary | ICD-10-CM

## 2017-12-15 DIAGNOSIS — G4700 Insomnia, unspecified: Secondary | ICD-10-CM

## 2017-12-15 DIAGNOSIS — K5900 Constipation, unspecified: Secondary | ICD-10-CM

## 2017-12-15 MED ORDER — NITROGLYCERIN 0.4 MG SL SUBL: 0.4000 mg | SUBLINGUAL_TABLET | SUBLINGUAL | 0 refills | Status: AC | PRN

## 2017-12-15 MED ORDER — CLOPIDOGREL BISULFATE 75 MG PO TABS: 75.0000 mg | ORAL_TABLET | Freq: Every day | ORAL | 3 refills | Status: AC

## 2017-12-15 MED ORDER — TRAZODONE HCL 100 MG PO TABS: 100.0000 mg | ORAL_TABLET | Freq: Every day | ORAL | 3 refills | Status: AC

## 2017-12-15 MED ORDER — ASPIRIN 81 MG PO CHEW: 81.0000 mg | CHEWABLE_TABLET | Freq: Every day | ORAL | 3 refills | Status: AC

## 2017-12-15 MED ORDER — DOCUSATE SODIUM 100 MG PO CAPS: 100.0000 mg | ORAL_CAPSULE | Freq: Every day | ORAL | 3 refills | Status: AC | PRN

## 2017-12-15 MED ORDER — LEVETIRACETAM 500 MG PO TABS: 500.0000 mg | ORAL_TABLET | Freq: Two times a day (BID) | ORAL | 3 refills | Status: AC

## 2017-12-15 NOTE — Telephone Encounter (Signed)
Spoke with patient's nephew, [name].

## 2017-12-15 NOTE — Telephone Encounter (Signed)
Per nephew,med refills need to faxed to [facility] @ [phone]. Please call [phone] once sent

## 2017-12-16 ENCOUNTER — Telehealth: Payer: Self-pay | Admitting: Physician Assistant

## 2017-12-16 ENCOUNTER — Telehealth: Payer: Self-pay | Admitting: Adult Health

## 2017-12-16 ENCOUNTER — Ambulatory Visit (INDEPENDENT_AMBULATORY_CARE_PROVIDER_SITE_OTHER): Payer: PPO | Admitting: Pharmacist

## 2017-12-16 ENCOUNTER — Other Ambulatory Visit: Payer: Self-pay

## 2017-12-16 DIAGNOSIS — I25110 Atherosclerotic heart disease of native coronary artery with unstable angina pectoris: Secondary | ICD-10-CM

## 2017-12-16 DIAGNOSIS — E782 Mixed hyperlipidemia: Secondary | ICD-10-CM | POA: Diagnosis not present

## 2017-12-16 DIAGNOSIS — R569 Unspecified convulsions: Secondary | ICD-10-CM

## 2017-12-16 DIAGNOSIS — K5900 Constipation, unspecified: Secondary | ICD-10-CM

## 2017-12-16 DIAGNOSIS — I63421 Cerebral infarction due to embolism of right anterior cerebral artery: Secondary | ICD-10-CM

## 2017-12-16 DIAGNOSIS — G4700 Insomnia, unspecified: Secondary | ICD-10-CM

## 2017-12-16 NOTE — Telephone Encounter (Signed)
Left vm for patients son to call back about his fathers driving.

## 2017-12-16 NOTE — Telephone Encounter (Signed)
Rn call patients son Samuel Vega about his phone all earlier of his father driving. Samuel Vega stated he did not call our office. He stated his son Samuel Vega is his fathers HCPOA, and probably would know. Rn stated Samuel Vega name and number is not listed. Samuel Vega stated his father drivers license has been revoke, and he should not be driving. Samuel Vega stated he will call back with Samuel Vega number.

## 2017-12-16 NOTE — Telephone Encounter (Signed)
Copied from CRM [id number]. Topic: Quick Communication - Office Called Patient >> Dec 16, 2017  8:42 AM Johnney Ou, RN wrote: Reason for CRM: Called and left VM. No [facility] pharmacy listed in pt chart. What is the reason for change and which pharmacy would they like this sent to? Crm done.  >> Dec 16, 2017  8:46 AM Arlyss Gandy, NT wrote: Sherron Monday with Debria Garret and states pt would like to use the [facility] pharmacy in [place]. Phone number for pharmacy is [phone].

## 2017-12-16 NOTE — Telephone Encounter (Signed)
Rn call Christiane Ha pts grandson who states he is the [place] for medical. Christiane Ha stated he lives in [place], and pts sister, and nephew live in the [place] area. He stated pt had a stroke, and a seizure. The pt is mad with his son [name] about his care and living situations. Christiane Ha stated the family thinks his grandfather needs to be in a assistant living facility for his safety and health. He stated pt forgets to take medications, and get them refill. THe pt had a nurse coming to the house for check up. Rn stated per [place] law the pt is not allowed to  be driving for 6 months after having a seizure. The grandson stated he was told the pts license is revoke,also he is having some vision issues too. Rn stated pt will be seen 56/14/2019 by Shanda Bumps stroke NP. The grandson will tell the family,and patient that he should not be driving due to seizures and other medical problems.The grandson verbalized understanding. He states the pts nephew and sister will be coming to appt.

## 2017-12-16 NOTE — Telephone Encounter (Signed)
This is a hospital follow up. Appt next week.

## 2017-12-16 NOTE — Telephone Encounter (Signed)
Ronnie call back and gave number of [phone] which is Christiane Ha the pts grandson.

## 2017-12-16 NOTE — Telephone Encounter (Signed)
Pts son called wanting to inform RN and NP that the pt has gotten his keys back and family is allowing to drive. Christiane Ha would like the NP Shanda Bumps to make it known at his appt that the pt should not be driving. FYI

## 2017-12-16 NOTE — Progress Notes (Signed)
Patient ID: ORRY SIGL                 DOB: 04/23/1924                    MRN: [id number]     HPI: Samuel Vega is a 84 y.o. male patient of Dr Clifton James referred to lipid clinic by Jacolyn Reedy, PA. PMH is significant for severe aortic stenosis, HLD, HTN, and recent CVA 10/09/17 with high grade stenosis of his right internal carotid artery. He underwent left and right heart cath 11/04/17 as preparation for TAVR and was found to have triple-vessel CAD. He underwent DES to the mid RCA. He has a history of statin intolerance and presents to lipid clinic for further management.  Pt presents today in good spirits with his nephew, Samuel Vega. He is pending TAVR. Medication history shows that pt has tried Lipitor and Zetia in the past. Pt does not recall taking these or any adverse reactions he may have experienced. He has his medications filled at the [facility].  Current Medications: none Intolerances: atorvastatin  daily, Zetia  daily - unsure of reaction Risk Factors: CAD s/p PCI, stroke, age, HTN LDL goal: 70mg /dL  Exercise: Ambulates with a walker post-stroke  Family History: The patient's family history includes Hypertension in his other.   Social History: Former smoker, quit in 1980s. Denies alcohol and illicit drug use.  Labs: 10/09/17: TC 235, TG 87, HDL 96, LDL 122 (no lipid lowering therapy)  Past Medical History:  Diagnosis Date  . Acute blood loss anemia   . Acute CVA (cerebrovascular accident) (HCC) 10/09/2017  . Anxiety   . Aortic stenosis, severe    for TAVR  . CAD (coronary artery disease)    a. s/p prox RCA DES x1 11/04/17  . Cardiac murmur   . Chronic back pain   . Chronic lower back pain   . CVA (cerebral vascular accident) (HCC) 09/2017   "left sided weakness since" (11/04/2017)  . Depression   . ED (erectile dysfunction)   . Elevated troponin 10/09/2017  . Fall   . HTN (hypertension)   . Hyperlipidemia   . Leukocytosis   . Macular degeneration    . Myocardial infarction (HCC) 09/2017  . PTSD (post-traumatic stress disorder)   . PTSD (post-traumatic stress disorder)   . Seizure (HCC)   . 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 aspirin 81 MG chewable tablet Chew 1 tablet (81 mg total) by mouth daily. 60 tablet 3  . clopidogrel (PLAVIX) 75 MG tablet Take 1 tablet (75 mg total) by mouth daily with breakfast. 90 tablet 3  . docusate sodium (COLACE) 100 MG capsule Take 1 capsule (100 mg total) by mouth daily as needed for mild constipation. 90 capsule 3  . levETIRAcetam (KEPPRA) 500 MG tablet Take 1 tablet (500 mg total) by mouth 2 (two) times daily. 180 tablet 3  . nitroGLYCERIN (NITROSTAT) 0.4 MG SL tablet Place 1 tablet (0.4 mg total) under the tongue every 5 (five) minutes as needed for chest pain. 25 tablet 0  . traZODone (DESYREL) 100 MG tablet Take 1 tablet (100 mg total) by mouth at bedtime. 90 tablet 3   No current facility-administered medications on file prior to visit.     Allergies  Allergen Reactions  . Statins Other (  See Comments)    MYALGIAS    Assessment/Plan:  1. Hyperlipidemia - Baseline LDL 122 above goal < 70 due to hx of CAD and stroke. Pt does not recall adverse events with prior statin therapy. Will start low dose rosuvastatin  daily. Advised pt to contact clinic if he develops any myalgias or other side effects. Will plan to recheck lipids in 2-3 months. Will wait to schedule this after upcoming TAVR so that lipid panel can be added on to a f/u visit to decrease # of appointments for patient. Hard copy rx mailed to [facility]. Will plan to titrate rosuvastatin if needed to achieve LDL < 70.   Megan E. Supple, PharmD, CPP, BCACP [facility] [address], [place], [place] [address] Phone: [phone]; Fax: [phone] 12/16/2017 12:24  PM

## 2017-12-16 NOTE — Telephone Encounter (Signed)
Called and left VM. No [facility] pharmacy listed in pt chart. What is the reason for change and which pharmacy would they like this sent to? Crm done.

## 2017-12-16 NOTE — Patient Instructions (Addendum)
It was nice to meet you today  Please start taking rosuvastatin (Crestor)  once a day for your cholesterol  Your goal LDL cholesterol is less than 70  If you have problems tolerating this, call Aundra Millet, Pharmacist in the cardiology office #[phone]  We will plan to recheck your cholesterol in 2-3 months

## 2017-12-16 NOTE — Telephone Encounter (Signed)
Were these faxed to the [place]? I see they were printed. If not, please let me know and I will reprint and fax to [phone]

## 2017-12-17 ENCOUNTER — Other Ambulatory Visit: Payer: Self-pay | Admitting: *Deleted

## 2017-12-17 NOTE — Addendum Note (Signed)
Addended by: Buck Mam on: 12/17/2017 05:49 PM   Modules accepted: Orders

## 2017-12-17 NOTE — Patient Outreach (Signed)
[facility]) Care Management  12/17/2017  Samuel Vega 05-28-1924 [id number]   Call placed to son, Samuel Vega, to inquire about contact with member to engage in services.  He report he is out of town with a patient and is unable to talk at this time.  He does state that he received a call stating member had fallen and his life alert button was activated.  Report EMS has been to evaluate him, he requested this care manager to follow up with his wife, Samuel Vega.    Call placed to [facility], she is unaware of a fall, but state she will go check on member once she leave work.  Call received back from Samuel Vega stating that she went to check on member and had an unpleasant encounter with member's nephew.  She state she was told that member was asleep and that his fall wasn't a true fall, but a fall out of bed. She report it has been very difficult to help care for member due to family dynamics.  Samuel Vega advised to discuss with Samuel Vega and have discussion with other family member's who will be the contact person as [facility] is unable to effectively assist if there is no clear line of communication.  She verbalized understanding.  Of note, multiple calls have been made to MD offices from son Samuel Vega), grandson Samuel Vega, stated POA who lives in [place]), and nephew Samuel Vega, son of member's sister who is another stated POA).  It is unclear who actually has POA and who the contact person should be.  Collaborated with Loyola Mast, LCSW regarding difficulty with family dynamics.  Telephone number received for member himself.  Will contact him directly to discuss involvement and appropriate contact persons.    Kemper Durie, [place], MSN [facility] Care Management  [facility] Manager [phone]

## 2017-12-17 NOTE — Patient Outreach (Signed)
[facility]) Care Management  12/17/2017  ZACKARIE CHASON Oct 08, 1923 [id number]   CSW attempted to reach pt today but was only able to reach his daughter in law, Harriett Sine.  She reports 's the pt has "fired his caregiver" and is only allowing them to come a few hours a day and has HH care and a nephew helping out.   Per family, pt's RX regimen is confusing and they are not what he is supposed to be taking as well as if he is taking them. CSW will place a consult for [facility] to assist.  CSW advised family that pt has the right to make poor decisions (like firing the caregiver) and opting to not move to ALF as long as he is competent.   Family reports his [place]on Gully is his sister, Joyce Gross and a grandson/MD in [place].  CSW encouraged family to attempt to get one person to oversee his medications and to possibly go to his MD appointments so family can be kept up to date on his needs, RX's, etc.  "he is refusing to have surgery".    CSW will plan to reach out to pt again to  check in and to offer home visit.   [name], MSW, LCSW Clinical Social Worker  [facility] [phone]

## 2017-12-18 ENCOUNTER — Other Ambulatory Visit: Payer: Self-pay | Admitting: *Deleted

## 2017-12-18 NOTE — Patient Outreach (Signed)
[facility]) Care Management  12/18/2017  Samuel Vega April 16, 1924 [id number]   Call placed to member to follow up on involvement with [facility] and point of contact.  He identifies himself but state he is having problems with his phone today, unable to hear this care manager.  He request call back on Monday.  Will follow up next week as requested.  Kemper Durie, [place], MSN [facility] Care Management  [facility] Manager [phone]

## 2017-12-18 NOTE — Telephone Encounter (Signed)
Patient desires to use his VA benefits. The prescriptions were faxed the pharmacy, who then advised [place] that they must come directly from his [place] provider. As such, they were then faxed to Dr. Jeri Lager. The patient's nephew then came in and picked up printed prescriptions, because they had not yet been sent to the pharmacy by Dr. Jeri Lager.

## 2017-12-21 ENCOUNTER — Other Ambulatory Visit: Payer: Self-pay | Admitting: *Deleted

## 2017-12-21 ENCOUNTER — Encounter: Payer: Self-pay | Admitting: *Deleted

## 2017-12-21 ENCOUNTER — Other Ambulatory Visit: Payer: Self-pay | Admitting: Pharmacist

## 2017-12-21 DIAGNOSIS — G4700 Insomnia, unspecified: Secondary | ICD-10-CM | POA: Insufficient documentation

## 2017-12-21 NOTE — Assessment & Plan Note (Signed)
Continue clopidogrel.  Follow-up with cardiology and neurology.

## 2017-12-21 NOTE — Assessment & Plan Note (Signed)
Recommend further evaluation with neurology as the patient denies any sort of memory or other cognitive issues.

## 2017-12-21 NOTE — Assessment & Plan Note (Signed)
I suspect this is related to anxiety and depression.  He has previously not tolerated SSRIs for treatment of PTSD, though he also does not recall ever having taken them.  He has tolerated trazodone but 50 mg is not currently adequate.  Try increasing to 100 mg.  He has a high fall risk.  Will attempt to balance assistance with sleep with risk for falls.

## 2017-12-21 NOTE — Assessment & Plan Note (Signed)
He saw Dr. Laneta Simmers earlier today. He and his family will discuss whether he would like to proceed with valve replacement.  He understands that deferring the surgery may result in worsening of his condition such that he may no longer be a candidate.

## 2017-12-21 NOTE — Assessment & Plan Note (Signed)
Continue per cardiology. 

## 2017-12-21 NOTE — Patient Outreach (Addendum)
[facility]) Care Management  12/21/2017  Samuel Vega 1923-11-21 [id number]   CSW attempted to reach patient again today by phone without success.  CSW has spoken with [employer] RN, Kemper Durie, who has also had challenges with communicating with patient because of pt's hearing difficulties.   This CSW will ask covering CSW to outreach again later this week if no return call is received.   CSW will also send unsuccessful outreach letter to patient.  [name], MSW, LCSW Clinical Social Worker  [facility] [phone]

## 2017-12-21 NOTE — Assessment & Plan Note (Signed)
Follow-up with neurology as planned.  He would like to discontinue Keppra.

## 2017-12-21 NOTE — Patient Outreach (Signed)
[facility]) Care Management  12/21/2017  Samuel Vega [birth year] [id number]   84 year old male currently followed by [facility] Care Management social worker and nurse. [facility] services requested for medication management.  PMHx includes, but not limited to, severe aortic stenosis pending TAVR procedure, HLD, HTN, recent CVA 10/09/17 with high grade stenosis of right ICA, CAD s/p recent stent placement, anxiety, depression, chronic lower back pain, and seizures.   Successful call to Mr. Ramberg's home today.  I spoke with patient's nephew, [name].  HIPAA identifiers verified. [name] reports that he and his sister, an Charity fundraiser, are helping with medications and they have "cleared everything up."  He reports that there no issues with affording medications.  He reports that patient has had issues with restless leg syndrome and would like to know if this is related to trazodone or alprazolam therapy.  During conversation, another health care provider arrived at home and Algernon Huxley reports he will call me back later today to finish conversation.   Plan: I will await call back from [facility]. If I do not hear from him today, I will follow-up again with him tomorrow morning.   Haynes Hoehn, PharmD, [facility] Clinical Pharmacist [facility] [phone]

## 2017-12-21 NOTE — Patient Outreach (Signed)
[facility]) Care Management  12/21/2017  Samuel Vega 1923-11-16 [id number]   Call placed to member as requested.  Nephew [name] picks up phone and asks this care manager to call him on his cell phone as member's house phone is still not working properly.  Call placed to [facility], member also present and grants permission to discuss medical concerns/plan stating "he's my right hand man."    Nephew report it has been difficult to organize member's healthcare since he has been home from the SNF due to multiple people coming and calling in addition to multiple family members' numbers listed as point of contacts.  He feel he now has a handle on things, state member is "alright."    Member continues to have some weakness and shortness of breath, has assistance of aide several hours a day as well as [name] himself.  Nephew also report that there will be someone coming from the [place] system for home assessment, unsure what services they will provide but he will find out when they come.  He does not feel there is a need for duplicate services.    He state member is taking medications as prescribed with the help of himself and the aide reminding him.  Member has multiple appointments over the next 2 weeks to include neurology, cardiac surgeon, and primary MD.  He denies any urgent concerns at this time.  This care manager attempts to schedule home visit but [name] requests call back next week once he has a better idea of the member's schedule.  Will follow up next week, advised to contact with questions or concerns.  [facility] CM Care Plan Problem One     Most Recent Value  Care Plan Problem One  Risk for readmission related to aortic stenosis as evidenced by recent hospitalization requiring stay SNF  Role Documenting the Problem One  Care Management Coordinator  Care Plan for Problem One  Active  [facility] Long Term Goal   Member will not be readmitted within 31 days of discharge  [facility] Long Term Goal  Start Date  12/21/17  Interventions for Problem One Long Term Goal  Discussed with member the importance of following discharge instructions, including follow up appointments, medications, diet, and home health involvement, to decrease the risk of readmission  [facility] CM Short Term Goal #1   Member will take medication as prescribed over the next 4 weeks  [facility] CM Short Term Goal #1 Start Date  12/21/17  Interventions for Short Term Goal #1  Medication list reviewed with nephew, advised of importance of taking as prescribed.  Advised to have someone remind/assist member daily to take medications  [facility] CM Short Term Goal #2   Member will keep and attend follow up with cardiac surgeon within the next week  [facility] CM Short Term Goal #2 Start Date  12/21/17  Interventions for Short Term Goal #2  Verified with nephew that member has appointment scheduled and transportation secured.  Advised of importance in keeping appointment as to develop plan to treat aortic stenosis     Kemper Durie, RN, MSN [facility] Care Management  [facility] Manager [phone]

## 2017-12-22 ENCOUNTER — Encounter: Payer: Self-pay | Admitting: Adult Health

## 2017-12-22 ENCOUNTER — Ambulatory Visit: Payer: PPO | Admitting: Adult Health

## 2017-12-22 ENCOUNTER — Other Ambulatory Visit: Payer: Self-pay | Admitting: Pharmacist

## 2017-12-22 ENCOUNTER — Ambulatory Visit: Payer: Self-pay | Admitting: Pharmacist

## 2017-12-22 VITALS — BP 110/67 | HR 72 | Ht 68.0 in | Wt 161.0 lb

## 2017-12-22 DIAGNOSIS — E782 Mixed hyperlipidemia: Secondary | ICD-10-CM | POA: Diagnosis not present

## 2017-12-22 DIAGNOSIS — I63421 Cerebral infarction due to embolism of right anterior cerebral artery: Secondary | ICD-10-CM

## 2017-12-22 DIAGNOSIS — I10 Essential (primary) hypertension: Secondary | ICD-10-CM

## 2017-12-22 NOTE — Patient Outreach (Signed)
[facility]) Care Management  12/22/2017  Samuel Vega [birth year] [id number]   Call placed to Mr. Mcgeehan's nephew, [name], regarding medication management.  [name] reports he cannot talk right now but states he will call me later this afternoon.   Plan: I will await call back from nephew later today. If I do not hear back, I will attempt 3rd outreach attempt later this week.   Haynes Hoehn, PharmD, [facility] Clinical Pharmacist [facility] [employer] [phone]

## 2017-12-22 NOTE — Patient Instructions (Signed)
Continue aspirin 81 mg daily and clopidogrel 75 mg daily  and Crestor  for secondary stroke prevention  Continue physical, occupation and speech therapy  Continue to follow up with PCP regarding hypertension and cholesterol management   Continue to monitor blood pressure at home  Maintain strict control of hypertension with blood pressure goal below 130/90, diabetes with hemoglobin A1c goal below 6.5% and cholesterol with LDL cholesterol (bad cholesterol) goal below 70 mg/dL. I also advised the patient to eat a healthy diet with plenty of whole grains, cereals, fruits and vegetables, exercise regularly and maintain ideal body weight.  Followup in the future with me in 3 months or call earlier if needed       Thank you for coming to see [place] at [facility]. I hope we have been able to provide you high quality care today.  You may receive a patient satisfaction survey over the next few weeks. We would appreciate your feedback and comments so that we may continue to improve ourselves and the health of our patients.

## 2017-12-22 NOTE — Progress Notes (Signed)
I agree with the above plan 

## 2017-12-22 NOTE — Addendum Note (Signed)
Addended by: Haynes Hoehn E on: 12/22/2017 03:39 PM   Modules accepted: Kipp Brood

## 2017-12-22 NOTE — Progress Notes (Signed)
[facility] [address] [place]. [place] [address] [phone]       OFFICE FOLLOW UP NOTE  Mr. Samuel Vega Date of Birth:  [birth year] Medical Record Number:  [id number]   Reason for Referral:  hospital stroke follow up  CHIEF COMPLAINT:  Chief Complaint  Patient presents with  . Follow-up    Stroke follow up ,pt in room with nephew [name]  PT is walking with walker, Pt was in hospital .Pt goes to [place] for his medication Pt states he is not driving    HPI: Samuel Vega is being seen today for initial visit in the office for superior medial parietal lobe subacute infarction on 10/08/17. History obtained from patient, nephew and chart review. Reviewed all radiology images and labs personally.  Samuel Vega is an 84 y.o. male with PMH of HTN, HLD and severe aortic stenosis who presented to the ED with acute onset of left sided weakness and recent fall "some time last night". Per his son, a retired Emergency planning/management officer, the patient lives at home alone and does not communicate with his family very frequently. He was found on the floor, lying on his left side, with bruising left > right, including a bruise to his left periorbital region. It was thought that he may have had a syncopal spell versus a fall due to loss of balance or weakness - CT revealed a subacute right medial parietal lobe infarction suggesting LLE weakness as the etiology for the fall per hospital notes. Initial exam by ED staff revealed that the patient had some trouble recognizing family members that came into the room but could recite correctly the month and the year. MRI brain reveals a superior medial parietal lobe subacute infarction.  CTA head/not showed 70 to 80% atheromatous narrowing at the right ICA bulb along with intracranial arthrosclerosis without proximal critical stenosis.  EEG showed mild diffuse slowing of the waking background along with additional focal slowing over the right  posterior quadrant.  2D echo shows an EF of 60 to 65%.  LDL 122 and A1c satisfactory at 5.2.  Recommended patient start Crestor 10 mg and be discharged on DAPT of Plavix and aspirin for 3 months total.  Also recommended 30-day event monitor to rule out A. fib.  Patient discharged to SNF facility. In addition to the recent stroke with high grade stenosis of right internal carotid artery, it was found that his aortic valve is heavily thickened and calcified with severe stenosis. Patient has a history of aortic stenosis but asymptomatic. Patient is in the work up for TAVR secondary to severe, stage D aortic stenosis. Patient underwent R/LHC on 11/04/17. Cardiologist possibly planning on doing TAVR on 12/29/17.   Since discharge, patient has been doing well. He is currently living back home and has been for the past 2 weeks.  He currently has a friend that comes over daily to help with certain ADLs and to assist with cooking and cleaning.  He has a nephew that is here with him today at the appointment who is involved in of his care as well.  Nephew is interested in having additional resources as the family would like to keep patient in home but understands that they will need additional assistance such as an home nursing due to mobility issues and slight decline in memory loss.  He recently was able to get therapy started which include home PT/OT/ST.  Patient is frustrated that he feels as though he makes improvements and  then continuously has setbacks due to moving locations where he resides or cardiac procedures.  He was previously able to ambulate without assistance and currently he is relying on rolling walker.  Patient also has complaints of continued left-sided weakness along with balance issues.  He is continuing to take both aspirin and Plavix where he has mild bruising but no bleeding.  He has not started taking Crestor yet as his niece has told him that he is statin intolerant but has only previously tried  Lipitor.  Blood pressure today satisfactory at 110/67.  Patient was started on 100 mg of trazodone on 12/15/2017 which was making patient lethargic throughout the day.  PCP started him on alprazolam 0.25 mg twice a day and decreased trazodone to 50 mg nightly.  This change occurred 3 to 4 days ago. Patient did have recent hospitalization on 10/26/2017 for apparent seizure activity with abdominal muscle fasciculation and left-sided muscle twitching and neurology recommended Keppra at discharge.  Patient denies this activity since starting Keppra.  EEG obtained during admission and did show slowing of posterior dominant rhythm along with additional focal slowing over the right posterior quadrant. Denies new or worsening stroke/TIA symptoms.     ROS:   14 system review of systems performed and negative with exception of hearing loss, daytime sleepiness, back pain, walking difficulty, weakness, tremors, agitation, and nervous/anxious  PMH:  Past Medical History:  Diagnosis Date  . Acute blood loss anemia   . Acute CVA (cerebrovascular accident) (HCC) 10/09/2017  . Anxiety   . Aortic stenosis, severe    for TAVR  . CAD (coronary artery disease)    a. s/p prox RCA DES x1 11/04/17  . Cardiac murmur   . Chronic back pain   . Chronic lower back pain   . CVA (cerebral vascular accident) (HCC) 09/2017   "left sided weakness since" (11/04/2017)  . Depression   . ED (erectile dysfunction)   . Elevated troponin 10/09/2017  . Fall   . HTN (hypertension)   . Hyperlipidemia   . Leukocytosis   . Macular degeneration   . Myocardial infarction (HCC) 09/2017  . PTSD (post-traumatic stress disorder)   . PTSD (post-traumatic stress disorder)   . Seizure (HCC)   . 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 CATARACT EXTRACTION, BILATERAL Bilateral   . CORONARY STENT INTERVENTION N/A 11/04/2017   Procedure:  CORONARY STENT INTERVENTION;  Surgeon: Kathleene Hazel, MD;  Location: [facility] INVASIVE CV LAB;  Service: Cardiovascular;  Laterality: N/A;  . ELBOW SURGERY  1980s   "? side; hurt when I put it down; had to have nerves clipped"  . EYE SURGERY    . IR RADIOLOGY PERIPHERAL GUIDED IV START  11/23/2017  . IR US GUIDE VASC ACCESS RIGHT  11/23/2017  . RIGHT/LEFT HEART CATH AND CORONARY ANGIOGRAPHY N/A 11/04/2017   Procedure: RIGHT/LEFT HEART CATH AND CORONARY ANGIOGRAPHY;  Surgeon: Kathleene Hazel, MD;  Location: [facility] INVASIVE CV LAB;  Service: Cardiovascular;  Laterality: N/A;    Social History:  Social History   Socioeconomic History  . Marital status: Divorced    Spouse name: n/a  . Number of children: 2  . Years of education: Not on file  . Highest education level: Not on file  Occupational History  . Occupation: retired  Engineer, production  . Financial resource strain: Not on file  .  Food insecurity:    Worry: Not on file    Inability: Not on file  . Transportation needs:    Medical: Not on file    Non-medical: Not on file  Tobacco Use  . Smoking status: Former Smoker    Years: 2.00    Types: Cigarettes  . Smokeless tobacco: Never Used  . Tobacco comment: "none since 1980s" (11/04/2017)  Substance and Sexual Activity  . Alcohol use: No    Comment: none since 1980  . Drug use: Never  . Sexual activity: Yes    Partners: Female  Lifestyle  . Physical activity:    Days per week: Not on file    Minutes per session: Not on file  . Stress: Not on file  Relationships  . Social connections:    Talks on phone: Not on file    Gets together: Not on file    Attends religious service: Not on file    Active member of club or organization: Not on file    Attends meetings of clubs or organizations: Not on file    Relationship status: Not on file  . Intimate partner violence:    Fear of current or ex partner: Not on file    Emotionally abused: Not on file    Physically abused:  Not on file    Forced sexual activity: Not on file  Other Topics Concern  . Not on file  Social History Narrative   Divorced in [place]. 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  His grandson is a Optometrist.    Family History:  Family History  Problem Relation Age of Onset  . Hypertension Other     Medications:   Current Outpatient Medications on File Prior to Visit  Medication Sig Dispense Refill  . ALPRAZolam (XANAX) 0.25 MG tablet Take 0.25 mg by mouth 2 (two) times daily as needed for anxiety.    Marland Kitchen aspirin 81 MG chewable tablet Chew 1 tablet (81 mg total) by mouth daily. 60 tablet 3  . clopidogrel (PLAVIX) 75 MG tablet Take 1 tablet (75 mg total) by mouth daily with breakfast. 90 tablet 3  . docusate sodium (COLACE) 100 MG capsule Take 1 capsule (100 mg total) by mouth daily as needed for mild constipation. 90 capsule 3  . levETIRAcetam (KEPPRA) 500 MG tablet Take 1 tablet (500 mg total) by mouth 2 (two) times daily. 180 tablet 3  . nitroGLYCERIN (NITROSTAT) 0.4 MG SL tablet Place 1 tablet (0.4 mg total) under the tongue every 5 (five) minutes as needed for chest pain. 25 tablet 0  . rosuvastatin (CRESTOR) 10 MG tablet Take 1 tablet (10 mg total) by mouth daily. 90 tablet 3  . traZODone (DESYREL) 100 MG tablet Take 1 tablet (100 mg total) by mouth at bedtime. (Patient taking differently: Take 50 mg by mouth at bedtime. ) 90 tablet 3   No current facility-administered medications on file prior to visit.     Allergies:   Allergies  Allergen Reactions  . Statins Other (See Comments)    MYALGIAS     Physical Exam  Vitals:   12/22/17 1326  BP: 110/67  Pulse: 72  Weight: 161 lb (73 kg)  Height:  (1.727 m)   Body mass index is 24.48 kg/m. No exam data present  General: well developed, elderly Caucasian male, well nourished, seated, in no evident distress Head: head normocephalic and atraumatic.   Neck: supple with no carotid or supraclavicular bruits Cardiovascular: regular rate and  rhythm, no murmurs Musculoskeletal:  no deformity Skin:  no rash/petichiae Vascular:  Normal pulses all extremities  Neurologic Exam Mental Status: Awake and fully alert. Oriented to place and time. Recent and remote memory intact. Attention span, concentration and fund of knowledge appropriate. Mood and affect appropriate.  Cranial Nerves: Fundoscopic exam reveals sharp disc margins. Pupils equal, briskly reactive to light. Extraocular movements full without nystagmus. Visual fields full to confrontation. Hearing intact. Facial sensation intact. Face, tongue, palate moves normally and symmetrically.  Motor: Normal bulk and tone. Normal strength in all tested extremity muscles.  4/5 weakness in left lower extremity Sensory.: intact to touch , pinprick , position and vibratory sensation.  Coordination: Rapid alternating movements normal in all extremities. Finger-to-nose and heel-to-shin performed accurately bilaterally.  Gait and Station: Arises from chair without difficulty. Stance is hunched.  Gait demonstrates slow cautious steps with assistance of rolling walker.  Patient unable to perform tandem gait. Reflexes: 1+ and symmetric. Toes downgoing.    NIHSS  0 Modified Rankin  3   Diagnostic Data (Labs, Imaging, Testing)  CT HEAD WO  CONTRAST 10/08/2017 IMPRESSION: Subtle low-density in the posteromedial high right parietal lobe concerning for acute infarction. No hemorrhage. Diffuse degenerative disc and facet disease in the cervical spine. No acute bony abnormality.  MR BRAIN WO CONTRAST 10/09/17 IMPRESSION: 1. Right superior and medial parietal lobe 36 cc area of predominantly cortical diffusion signal abnormality demonstrating areas of both reduced and increased diffusion. Findings probably represents a subacute infarction, but cerebritis or seizure activity should also be considered. No hemorrhage or mass effect. 2. Mild chronic microvascular ischemic changes and mild  parenchymal volume loss of the brain. 3. Displacement of cortical veins over the right cerebral convexity from the calvarium, possibly small hygroma. No significant mass effect on the brain.  MR MRA HEAD WO CONTRAST 10/09/2017 IMPRESSION: Patent anterior and posterior intracranial circulation. No large vessel occlusion or aneurysm. Increase flow related signal within right ACA and MCA distributions, probably hyperemia given right parietal abnormality.  CT ANGIO NECK W OR WO CONTRAST CT ANGIO HEAD W OR WO CONTRAST 10/09/2017 IMPRESSION: 1. 70-80% atheromatous narrowing at the right ICA bulb. 2. No other significant stenosis in the neck. 3. Intracranial atherosclerosis without proximal or correctable stenosis. There is moderate atheromatous narrowing of the distal left P2 segment. 4. Presumed infarct in the right parietal lobe (see brain MRI report). Mild associated enhancement would correlate with subacute timing.  CT HEAD WO CONTRAST (TREMORS) 10/26/2017 IMPRESSION: Evidence of patient's known subacute infarct over the right posterior parietal region without significant change. No new areas of infarction and no acute hemorrhage. Mild chronic ischemic microvascular disease.  MR BRAIN WO CONTRAST 10/26/2017 IMPRESSION: Typical evolutionary change for subacute RIGHT parietal infarction, without new areas of cerebral infarction. RIGHT subdural hygroma has essentially resolved, but there is residual subacute RIGHT 3 mm thick subdural hematoma, also present previously, and unchanged. This is a nonsurgical lesion. Atrophy and small vessel disease, not unexpected for age.  EEG  10/09/2017 Impression: This awake and asleep EEG is abnormal due to the presence of: 1. Mild diffuse slowing of the waking background 2. Additional focal slowing over the right posterior quadrant  ECHOCARDIOGRAM COMPLETE 10/10/17 Study Conclusions - Left ventricle: The cavity size was normal. Wall  thickness was   increased in a pattern of moderate LVH. Systolic function was   normal. The estimated ejection fraction was in the range of 60%   to 65%. Wall motion was normal; there were no regional wall  motion abnormalities. Doppler parameters are consistent with   abnormal left ventricular relaxation (grade 1 diastolic   dysfunction). - Aortic valve: Valve mobility was moderately restricted.   Transvalvular velocity was increased. There was critical   stenosis. Valve area (VTI): 0.41 cm^2. Valve area (Vmax): 0.43   cm^2. Valve area (Vmean): 0.4 cm^2. - Mitral valve: Mildly calcified annulus.  EEG ADULT 10/26/2017 Impression: This awake and asleep EEG is abnormal due to the presence of: 1. Slowing of the posterior dominant rhythm 2. Additional focal slowing over the right posterior quadrant     ASSESSMENT: MAKAR SLATTER is a 84 y.o. year old male here with right superior medial parietal lobe subacute infarction on 10/08/2017 secondary to severe right ICA stenosis versus cardioembolic. Vascular risk factors include HLD, aortic stenosis and carotid stenosis.    PLAN: -Continue aspirin 81 mg daily and clopidogrel 75 mg daily  And start Crestor for secondary stroke prevention -cleared from neurology standpoint for TAVR - spoke to Dr. Pearlean Brownie who recommended patient continue at least aspirin thru the procedure but after speaking with Julieta Gutting, RN (Structural Heart Nurse Navigator) she stated "We will keep the pt on ASA and plavix for TAVR. With all TAVR cases ASA and plavix are indicated for 6 months following procedure unless the pt is already on a DOAC and then we keep pt on DOAC and ASA. The pt will see Dr Cornelius Moras tomorrow and from that apt we will determine exact date of TAVR." through a staff message -continue home PT/OT/ST -recommended referral for social work to help with community resources but per both patient and nephew, they have assistance through the [place] but they have  been having a difficult time contacting them regarding additional resources. Advised to let us know if they would like a referral for a Child psychotherapist -F/u with PCP regarding your HLD management -continue to monitor BP at home -Maintain strict control of hypertension with blood pressure goal below 130/90, diabetes with hemoglobin A1c goal below 6.5% and cholesterol with LDL cholesterol (bad cholesterol) goal below 70 mg/dL. I also advised the patient to eat a healthy diet with plenty of whole grains, cereals, fruits and vegetables, exercise regularly and maintain ideal body weight.  Follow up in 3 months or call earlier if needed   Greater than 50% time during this 25 minute consultation visit was spent on counseling and coordination of care about HLD, discussion about risk benefit of anticoagulation, cardiac procedure and answering questions.    George Hugh, AGNP-BC  [facility] [address] [place], [place] [address]  Phone [phone] Fax [phone]

## 2017-12-23 ENCOUNTER — Encounter: Payer: Self-pay | Admitting: *Deleted

## 2017-12-23 ENCOUNTER — Other Ambulatory Visit: Payer: Self-pay | Admitting: *Deleted

## 2017-12-23 ENCOUNTER — Other Ambulatory Visit: Payer: Self-pay

## 2017-12-23 ENCOUNTER — Telehealth: Payer: Self-pay

## 2017-12-23 ENCOUNTER — Institutional Professional Consult (permissible substitution) (INDEPENDENT_AMBULATORY_CARE_PROVIDER_SITE_OTHER): Payer: PPO | Admitting: Thoracic Surgery (Cardiothoracic Vascular Surgery)

## 2017-12-23 ENCOUNTER — Encounter: Payer: Self-pay | Admitting: Thoracic Surgery (Cardiothoracic Vascular Surgery)

## 2017-12-23 VITALS — BP 123/68 | HR 75 | Resp 18 | Ht 68.0 in | Wt 161.0 lb

## 2017-12-23 DIAGNOSIS — Z955 Presence of coronary angioplasty implant and graft: Secondary | ICD-10-CM | POA: Diagnosis not present

## 2017-12-23 DIAGNOSIS — I25110 Atherosclerotic heart disease of native coronary artery with unstable angina pectoris: Secondary | ICD-10-CM | POA: Diagnosis not present

## 2017-12-23 DIAGNOSIS — I350 Nonrheumatic aortic (valve) stenosis: Secondary | ICD-10-CM

## 2017-12-23 NOTE — Telephone Encounter (Signed)
Already addressed with [name] in person, on 12/18/2017,as this message had not been forwarded to me

## 2017-12-23 NOTE — Telephone Encounter (Signed)
Message from [facility] sent to [place].

## 2017-12-23 NOTE — Telephone Encounter (Signed)
Copied from CRM [id number]. Topic: General - Other >> Dec 18, 2017 11:09 AM Elliot Gault wrote: Caller name: Debria Garret [facility]) Relation to pt: Nephew Call back number: [phone] (M)   Reason for call:  Nephew stated cant remember the name of the medication PCP recently increased to  and requesting medication to go back to 50 MG due to patient feeling fatigue and gait concerns, please advise  >> Dec 18, 2017 11:19 AM Elliot Gault wrote: Caller name: Debria Garret [facility]) Relation to pt: Nephew Call back number: [phone] (M)   Reason for call:  Nephew stated cant remember the name of the medication PCP recently increased to  and requesting medication to go back to 50 MG due to patient feeling fatigue and gait concerns, please advise

## 2017-12-23 NOTE — Patient Instructions (Signed)
Continue all previous medications without any changes at this time  

## 2017-12-23 NOTE — Patient Outreach (Addendum)
[facility]) Care Management  12/23/2017  Samuel Vega September 04, 1923 [id number]   CSW made an initial attempt to try and contact patient's nephew, Debria Garret today to perform the initial phone assessment, as well as assess and assist with social work needs and services, without success.  A HIPAA compliant message was left for [name] on voicemail.  CSW is currently awaiting a return call.  CSW will make a second outreach attempt within the next 3-4 business days, if a return call is not received from [name] in the meantime.  CSW will also mail an Outreach Letter to patient's home requesting that patient contact CSW if patient is interested in receiving social work services through CSW with Chief Executive Officer. Danford Bad, BSW, MSW, LCSW  Licensed [employer], [employer]  [address] [address], [place], [place] [address] Physical Address-[address], [place], [place] [address] Toll Free Main # [phone] Fax # [phone] Cell # [phone]  Office # [phone] [email or web address]

## 2017-12-23 NOTE — Progress Notes (Signed)
[facility]  MULTIDISCIPLINARY HEART VALVE CLINIC  CARDIOTHORACIC SURGERY CONSULTATION REPORT  Referring Provider is Chilton Si, MD  Primary Cardiologist is Lyn Records, MD PCP is Porfirio Oar, PA-C  Chief Complaint  Patient presents with  . Aortic Stenosis    2nd TAVR consultation, review all studies    HPI:  Patient is a 84 year old male with history of aortic stenosis, cerebrovascular disease status post acute embolic stroke October 09, 2017, coronary artery disease status post PCI of right coronary artery on November 04, 2017, hypertension, and hyperlipidemia who has been referred for second surgical opinion to discuss treatment options for management of severe symptomatic aortic stenosis.  Patient was noted to have a heart murmur many years ago and diagnosed with severe aortic stenosis.  He has been evaluated previously by Dr. Katrinka Blazing and was reportedly asymptomatic at that time.  He was lost to follow-up until recently.  He presented acutely on October 09, 2017 following a syncopal episode associated with a stroke causing left-sided hemiparesis.  CT and MRI of the brain revealed large right parietal lobe infarct and CTA revealed 70 to 80% stenosis of the right internal carotid artery.  Patient was evaluated by Dr. Imogene Burn from vascular surgery who felt that right carotid endarterectomy was indicated, but work-up was postponed to facilitate management of the patient's cardiac disease.  Echocardiogram performed October 10, 2017 revealed critical aortic stenosis.  Peak and mean transvalvular gradients across the aortic valve are estimated 111 and 71 mmHg, respectively.  The DVI was reported 0.14 with an aortic valve area estimated 0.43 cm.  Left ventricular systolic function remain normal with ejection fraction estimated 60 to 65%.  The patient recovered from his stroke reasonably well and was discharged to a local skilled nursing facility.  He was readmitted following another  syncopal episode and felt possibly to have had a seizure.  He was discharged again to a different nursing home and did fairly well.  He underwent elective diagnostic cardiac catheterization on November 04, 2017.  Catheterization revealed severe three-vessel coronary artery disease with 95% stenosis of the mid right coronary artery.  Mean transvalvular gradient across the aortic valve was measured 59.5 mmHg corresponding to aortic valve area estimated 0.8 cm.  The patient underwent PCI and stenting of the right coronary artery.  He was again discharged from the hospital and recovered to the point where he he has now living at home where he lives alone.  He underwent CT angiography to evaluate the feasibility of transcatheter aortic valve replacement and has been seen in consultation previously by Dr. Laneta Simmers on Dec 09, 2017.  Transcatheter aortic valve replacement was recommended and the patient has been referred for a second surgical opinion.  The patient is single and lives alone locally in [place].  He has 2 adult children.  His sister is his medical power of attorney but recently broke her arm.  He is accompanied by his nephew for his office visit today.  Up until March the patient was driving an automobile limited distances although he admits that he probably should not be because of poor eyesight.  He states that he still feels quite weak and he has a poor appetite.  He has lost 20 pounds in weight.  He fatigues easily.  He denies any symptoms of chest pain or chest tightness.  He has not had palpitations.  He has not been having recent dizzy spells.  He is able to walk short distances without assistance although he  now uses a walker for longer distances.  Past Medical History:  Diagnosis Date  . Acute blood loss anemia   . Acute CVA (cerebrovascular accident) (HCC) 10/09/2017  . Anxiety   . Aortic stenosis, severe    for TAVR  . CAD (coronary artery disease)    a. s/p prox RCA DES x1 11/04/17  .  Cardiac murmur   . Chronic back pain   . Chronic lower back pain   . CVA (cerebral vascular accident) (HCC) 09/2017   "left sided weakness since" (11/04/2017)  . Depression   . ED (erectile dysfunction)   . Elevated troponin 10/09/2017  . Fall   . HTN (hypertension)   . Hyperlipidemia   . Leukocytosis   . Macular degeneration   . Myocardial infarction (HCC) 09/2017  . PTSD (post-traumatic stress disorder)   . PTSD (post-traumatic stress disorder)   . Seizure (HCC)   . 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 CATARACT EXTRACTION, BILATERAL Bilateral   . CORONARY STENT INTERVENTION N/A 11/04/2017   Procedure: CORONARY STENT INTERVENTION;  Surgeon: Kathleene Hazel, MD;  Location: [facility];  Service: Cardiovascular;  Laterality: N/A;  . ELBOW SURGERY  1980s   "? side; hurt when I put it down; had to have nerves clipped"  . EYE SURGERY    . IR RADIOLOGY PERIPHERAL GUIDED IV START  11/23/2017  . IR US GUIDE VASC ACCESS RIGHT  11/23/2017  . RIGHT/LEFT HEART CATH AND CORONARY ANGIOGRAPHY N/A 11/04/2017   Procedure: RIGHT/LEFT HEART CATH AND CORONARY ANGIOGRAPHY;  Surgeon: Kathleene Hazel, MD;  Location: [facility];  Service: Cardiovascular;  Laterality: N/A;    Family History  Problem Relation Age of Onset  . Hypertension Other     Social History   Socioeconomic History  . Marital status: Divorced    Spouse name: n/a  . Number of children: 2  . Years of education: Not on file  . Highest education level: Not on file  Occupational History  . Occupation: retired  Engineer, production  . Financial resource strain: Not on file  . Food insecurity:    Worry: Not on file    Inability: Not on file  . Transportation needs:    Medical: Not on file    Non-medical: Not on file  Tobacco Use  . Smoking status: Former Smoker    Years: 2.00    Types: Cigarettes  .  Smokeless tobacco: Never Used  . Tobacco comment: "none since 1980s" (11/04/2017)  Substance and Sexual Activity  . Alcohol use: No    Comment: none since 1980  . Drug use: Never  . Sexual activity: Yes    Partners: Female  Lifestyle  . Physical activity:    Days per week: Not on file    Minutes per session: Not on file  . Stress: Not on file  Relationships  . Social connections:    Talks on phone: Not on file    Gets together: Not on file    Attends religious service: Not on file    Active member of club or organization: Not on file    Attends meetings of clubs or organizations: Not on file    Relationship status: Not on file  . Intimate partner violence:    Fear of current or ex partner: Not on file    Emotionally abused: Not  on file    Physically abused: Not on file    Forced sexual activity: Not on file  Other Topics Concern  . Not on file  Social History Narrative   Divorced in [place]. 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  His grandson is a Optometrist.    Current Outpatient Medications  Medication Sig Dispense Refill  . ALPRAZolam (XANAX) 0.25 MG tablet Take 0.25 mg by mouth 2 (two) times daily as needed for anxiety.    Marland Kitchen aspirin 81 MG chewable tablet Chew 1 tablet (81 mg total) by mouth daily. 60 tablet 3  . clopidogrel (PLAVIX) 75 MG tablet Take 1 tablet (75 mg total) by mouth daily with breakfast. 90 tablet 3  . docusate sodium (COLACE) 100 MG capsule Take 1 capsule (100 mg total) by mouth daily as needed for mild constipation. 90 capsule 3  . levETIRAcetam (KEPPRA) 500 MG tablet Take 1 tablet (500 mg total) by mouth 2 (two) times daily. 180 tablet 3  . nitroGLYCERIN (NITROSTAT) 0.4 MG SL tablet Place 1 tablet (0.4 mg total) under the tongue every 5 (five) minutes as needed for chest pain. 25 tablet 0  . rosuvastatin (CRESTOR) 10 MG tablet Take 1 tablet (10 mg total) by mouth daily. 90 tablet 3  . traZODone (DESYREL) 100 MG tablet Take 1 tablet (100 mg total) by mouth at bedtime. (Patient  taking differently: Take 50 mg by mouth at bedtime. ) 90 tablet 3   No current facility-administered medications for this visit.     Allergies  Allergen Reactions  . Statins Other (See Comments)    MYALGIAS      Review of Systems:   General:  poor appetite, decreased energy, no weight gain, + weight loss, no fever  Cardiac:  no chest pain with exertion, no chest pain at rest, no SOB with exertion, no resting SOB, no PND, no orthopnea, no palpitations, no arrhythmia, no atrial fibrillation, no LE edema, + dizzy spells, + syncope  Respiratory:  no shortness of breath, no home oxygen, no productive cough, no dry cough, no bronchitis, no wheezing, no hemoptysis, no asthma, no pain with inspiration or cough, no sleep apnea, no CPAP at night  GI:   no difficulty swallowing, no reflux, no frequent heartburn, no hiatal hernia, no abdominal pain, + constipation, no diarrhea, no hematochezia, no hematemesis, no melena  GU:   no dysuria,  no frequency, no urinary tract infection, no hematuria, no enlarged prostate, no kidney stones, no kidney disease  Vascular:  no pain suggestive of claudication, no pain in feet, no leg cramps, no varicose veins, no DVT, no non-healing foot ulcer  Neuro:   + stroke, no TIA's, ? seizures, no headaches, no temporary blindness one eye,  no slurred speech, no peripheral neuropathy, no chronic pain, + instability of gait, + memory/cognitive dysfunction  Musculoskeletal: + arthritis, no joint swelling, no myalgias, + difficulty walking, decreased mobility   Skin:   no rash, no itching, no skin infections, no pressure sores or ulcerations  Psych:   + anxiety, no depression, no nervousness, no unusual recent stress  Eyes:   + blurry vision, no floaters, no recent vision changes, + wears glasses or contacts  ENT:   no hearing loss, no loose or painful teeth, + dentures, last saw dentist 4-5 months ago  Hematologic:  + easy bruising, no abnormal bleeding, no clotting  disorder, no frequent epistaxis  Endocrine:  no diabetes, does not check CBG's at home  Physical Exam:   Ht 5\' 8"  (1.727 m)   Wt 161 lb (73 kg)   BMI 24.48 kg/m   General:  Elderly and somewhat fraill-appearing  HEENT:  Unremarkable   Neck:   no JVD, no bruits, no adenopathy   Chest:   clear to auscultation, symmetrical breath sounds, no wheezes, no rhonchi   CV:   RRR, grade III/VI crescendo/decrescendo murmur heard best at RSB,  no diastolic murmur  Abdomen:  soft, non-tender, no masses   Extremities:  warm, well-perfused, pulses diminished but palpable, no LE edema  Rectal/GU  Deferred  Neuro:   Grossly non-focal and symmetrical throughout  Skin:   Clean and dry, no rashes, no breakdown   Diagnostic Tests:  Transthoracic Echocardiography  Patient:    Samuel Vega, Samuel Vega MR #:       [id number] Study Date: 10/10/2017 Gender:     M Age:        89 Height:     172.7 cm Weight:     81.6 kg BSA:        2 m^2 Pt. Status: Room:       [id number]   ADMITTING    Ara Kussmaul  SONOGRAPHER  [address]  ATTENDING    Tilden Fossa [id number]  PERFORMING   [facility], Inpatient  cc:  ------------------------------------------------------------------- LV EF: 60% -   65%  ------------------------------------------------------------------- Indications:      Syncope 780.2.  ------------------------------------------------------------------- History:   PMH:  Rhabdomyosis, Elevated Troponin. PTSD.  Syncope. Stroke.  Risk factors:  Dyslipidemia.  ------------------------------------------------------------------- Study Conclusions  - Left ventricle: The cavity size was normal. Wall thickness was   increased in a pattern of moderate LVH. Systolic function was   normal. The estimated ejection fraction was in the range of 60%   to 65%. Wall motion was normal; there were no regional wall    motion abnormalities. Doppler parameters are consistent with   abnormal left ventricular relaxation (grade 1 diastolic   dysfunction). - Aortic valve: Valve mobility was moderately restricted.   Transvalvular velocity was increased. There was critical   stenosis. Valve area (VTI): 0.41 cm^2. Valve area (Vmax): 0.43   cm^2. Valve area (Vmean): 0.4 cm^2. - Mitral valve: Mildly calcified annulus.  ------------------------------------------------------------------- Study data:  Comparison was made to the study of 04/12/2014.  Study status:  Routine.  Procedure:  Transthoracic echocardiography. Image quality was adequate.  Study completion:  There were no complications.          Transthoracic echocardiography.  M-mode, complete 2D, spectral Doppler, and color Doppler.  Birthdate: Patient birthdate: 05-Nov-1923.  Age:  Patient is 84 yr old.  Sex: Gender: male.    BMI: 27.4 kg/m^2.  Blood pressure:     115/86 Patient status:  Inpatient.  Study date:  Study date: 10/10/2017. Study time: 02:43 PM.  Location:  Bedside.  -------------------------------------------------------------------  ------------------------------------------------------------------- Left ventricle:  The cavity size was normal. Wall thickness was increased in a pattern of moderate LVH. Systolic function was normal. The estimated ejection fraction was in the range of 60% to 65%. Wall motion was normal; there were no regional wall motion abnormalities. Doppler parameters are consistent with abnormal left ventricular relaxation (grade 1 diastolic dysfunction).  ------------------------------------------------------------------- Aortic valve:   Trileaflet; moderately thickened, severely calcified leaflets. Valve mobility was moderately restricted. Doppler:  Transvalvular velocity was increased. There was critical stenosis. There was no regurgitation.  VTI ratio of LVOT to aortic valve: 0.13. Valve area (VTI): 0.41 cm^2.  Indexed valve area (VTI): 0.21 cm^2/m^2. Peak velocity ratio of LVOT to aortic valve: 0.14. Valve area (Vmax): 0.43 cm^2. Indexed valve area (Vmax): 0.22 cm^2/m^2. Mean velocity ratio of LVOT to aortic valve: 0.13. Valve area (Vmean): 0.4 cm^2. Indexed valve area (Vmean): 0.2 cm^2/m^2.  Mean gradient (S): 71 mm Hg. Peak gradient (S): 111 mm Hg.  ------------------------------------------------------------------- Aorta:  Aortic root: The aortic root was normal in size.  ------------------------------------------------------------------- Mitral valve:   Mildly calcified annulus. Mobility was not restricted.  Doppler:  Transvalvular velocity was within the normal range. There was no evidence for stenosis. There was no regurgitation.    Valve area by pressure half-time: 3.24 cm^2. Indexed valve area by pressure half-time: 1.62 cm^2/m^2.    Peak gradient (D): 2 mm Hg.  ------------------------------------------------------------------- Left atrium:  The atrium was normal in size.  ------------------------------------------------------------------- Right ventricle:  The cavity size was normal. Wall thickness was normal. Systolic function was normal.  ------------------------------------------------------------------- Pulmonic valve:    The valve appears to be grossly normal. Doppler:  Transvalvular velocity was within the normal range. There was no evidence for stenosis.  ------------------------------------------------------------------- Tricuspid valve:   Structurally normal valve.    Doppler: Transvalvular velocity was within the normal range. There was no regurgitation.  ------------------------------------------------------------------- Right atrium:  The atrium was normal in size.  ------------------------------------------------------------------- Pericardium:  There was no pericardial  effusion.  ------------------------------------------------------------------- Systemic veins: Inferior vena cava: The vessel was normal in size.  ------------------------------------------------------------------- Measurements   Left ventricle                           Value          Reference  LV ID, ED, PLAX chordal          (L)     32    mm       43 - 52  LV ID, ES, PLAX chordal                  23    mm       23 - 38  LV fx shortening, PLAX chordal   (L)     28    %        >=29  LV PW thickness, ED                      14    mm       ----------  IVS/LV PW ratio, ED                      1              <=1.3  Stroke volume, 2D                        53    ml       ----------  Stroke volume/bsa, 2D                    27    ml/m^2   ----------  LV e&', lateral                           7.18  cm/s     ----------  LV E/e&', lateral  10.54          ----------  LV e&', medial                            6.31  cm/s     ----------  LV E/e&', medial                          12             ----------  LV e&', average                           6.75  cm/s     ----------  LV E/e&', average                         11.22          ----------    Ventricular septum                       Value          Reference  IVS thickness, ED                        14    mm       ----------    LVOT                                     Value          Reference  LVOT ID, S                               20    mm       ----------  LVOT area                                3.14  cm^2     ----------  LVOT peak velocity, S                    71.7  cm/s     ----------  LVOT mean velocity, S                    50.5  cm/s     ----------  LVOT VTI, S                              16.8  cm       ----------    Aortic valve                             Value          Reference  Aortic valve peak velocity, S            527   cm/s     ----------  Aortic valve mean velocity, S            400   cm/s      ----------  Aortic valve VTI, S  130   cm       ----------  Aortic mean gradient, S                  71    mm Hg    ----------  Aortic peak gradient, S                  111   mm Hg    ----------  VTI ratio, LVOT/AV                       0.13           ----------  Aortic valve area, VTI                   0.41  cm^2     ----------  Aortic valve area/bsa, VTI               0.21  cm^2/m^2 ----------  Velocity ratio, peak, LVOT/AV            0.14           ----------  Aortic valve area, peak velocity         0.43  cm^2     ----------  Aortic valve area/bsa, peak              0.22  cm^2/m^2 ----------  velocity  Velocity ratio, mean, LVOT/AV            0.13           ----------  Aortic valve area, mean velocity         0.4   cm^2     ----------  Aortic valve area/bsa, mean              0.2   cm^2/m^2 ----------  velocity    Aorta                                    Value          Reference  Aortic root ID, ED                       27    mm       ----------    Left atrium                              Value          Reference  LA ID, A-P, ES                           41    mm       ----------  LA ID/bsa, A-P                           2.05  cm/m^2   <=2.2  LA volume, S                             52.7  ml       ----------  LA volume/bsa, S                         26.4  ml/m^2   ----------  LA volume, ES, 1-p A4C                   44.3  ml       ----------  LA volume/bsa, ES, 1-p A4C               22.2  ml/m^2   ----------  LA volume, ES, 1-p A2C                   57.9  ml       ----------  LA volume/bsa, ES, 1-p A2C               29    ml/m^2   ----------    Mitral valve                             Value          Reference  Mitral E-wave peak velocity              75.7  cm/s     ----------  Mitral A-wave peak velocity              104   cm/s     ----------  Mitral deceleration time         (H)     232   ms       150 - 230  Mitral pressure half-time                68    ms        ----------  Mitral peak gradient, D                  2     mm Hg    ----------  Mitral E/A ratio, peak                   0.7            ----------  Mitral valve area, PHT, DP               3.24  cm^2     ----------  Mitral valve area/bsa, PHT, DP           1.62  cm^2/m^2 ----------    Right atrium                             Value          Reference  RA ID, S-I, ES, A4C                      48.7  mm       34 - 49  RA area, ES, A4C                         16    cm^2     8.3 - 19.5  RA volume, ES, A/L                       45.3  ml       ----------  RA volume/bsa, ES, A/L                   22.7  ml/m^2   ----------    Systemic veins  Value          Reference  Estimated CVP                            3     mm Hg    ----------    Right ventricle                          Value          Reference  TAPSE                                    27    mm       ----------  RV s&', lateral, S                        18    cm/s     ----------  Legend: (L)  and  (H)  mark values outside specified reference range.  ------------------------------------------------------------------- Prepared and Electronically Authenticated by  Georga Hacking, MD [facility] 2019-03-02T16:30:10   CORONARY STENT INTERVENTION  RIGHT/LEFT HEART CATH AND CORONARY ANGIOGRAPHY  Conclusion     Prox RCA-1 lesion is 30% stenosed.  Prox RCA-2 lesion is 95% stenosed.  Mid RCA to Dist RCA lesion is 20% stenosed.  Ost RPDA lesion is 30% stenosed.  Ost 3rd Mrg lesion is 50% stenosed.  Prox Cx to Mid Cx lesion is 30% stenosed.  Ost LAD to Prox LAD lesion is 70% stenosed.  Prox LAD-1 lesion is 70% stenosed.  Prox LAD-2 lesion is 70% stenosed.  Mid LAD lesion is 50% stenosed.  Mid LAD to Dist LAD lesion is 50% stenosed.  A drug-eluting stent was successfully placed using a STENT SYNERGY DES [id number].  Post intervention, there is a 0% residual stenosis.   1. Triple vessel CAD 2.  Moderately severe, calcified stenosis in the proximal and mid LAD 3. Moderate disease in the Circumflex and first OM branch 4. The RCA is large dominant vessel with severe, heavily calcified stenosis in the mid vessel.  5. Successful PTCA/DES in the mid RCA 6. Severe aortic stenosis (mean gradient 59.5 mmHg, peak gradient 71 mmHg, AVA 0.8 cm2)  Recommendations: Will continue DAPT with ASA and Plavix. He is statin intolerant. Consider Praluent/Repatha. Will monitor overnight. Will continue planning for TAVR. Will plan medical therapy of his LAD.    Indications   Coronary artery disease involving native coronary artery of native heart with unstable angina pectoris (HCC) [I25.110 (ICD-10-CM)]  Severe aortic stenosis [I35.0 (ICD-10-CM)]  Procedural Details/Technique   Technical Details Indication: 84 yo male with h/o severe AS, severe carotid artery disease with recent CVA here today for cath as part of TAVR workup. Recent worsened dyspnea and chest pain.   Procedure: The risks, benefits, complications, treatment options, and expected outcomes were discussed with the patient. The patient and/or family concurred with the proposed plan, giving informed consent. The patient was brought to the cath lab after IV hydration was given. The patient was not sedated. The right groin was prepped and draped. I placed a 7 French sheath in the right femoral vein under u/s guidance. Right heart cath performed with a balloon tipped catheter. The right wrist was prepped and draped in a sterile fashion. 1% lidocaine was used for local anesthesia. Using the modified Seldinger access  technique, a 5 French sheath was placed in the right radial artery. 3 mg Verapamil was given through the sheath. 4000 units IV heparin was given. Standard diagnostic catheters were used to perform selective coronary angiography. I crossed the aortic valve with an AL-2 catheter and a straight wire. LV pressures measured.   PCI Note: IV  heparin used for anti-coagulation. ACT over 300. Plavix 600 mg po x 1. I engaged the RCA with an AL-1 guiding catheter and a Cougar IC wire was advanced down the RCA. I then used a 2.5 x 12 mm balloon to pre-dilate the stenosis in the mid RCA. I then deployed a 2.75 x 20 mm Synergy DES in the mid RCA. The stent was post-dilated with a 3.0 x 12 mm [place] balloon x 2. The stenosis was taken from 95% down to 0%. TIMI 3 flow pre and post stent.   The sheath was removed from the right radial artery and a Zephyr hemostasis band was applied at the arteriotomy site on the right wrist.     Estimated blood loss <50 mL.  During this procedure no sedation was administered.  Complications   Complications documented before study signed (11/04/2017 10:19 AM EDT)      Log Level Complications   None Documented by Kathleene Hazel, MD 11/04/2017 10:13 AM EDT  Time Range: Intraprocedure    Coronary Findings   Diagnostic  Dominance: Right  Left Anterior Descending  Vessel is large.  Ost LAD to Prox LAD lesion 70% stenosed  Ost LAD to Prox LAD lesion is 70% stenosed. The lesion is calcified.  Prox LAD-1 lesion 70% stenosed  Prox LAD-1 lesion is 70% stenosed.  Prox LAD-2 lesion 70% stenosed  Prox LAD-2 lesion is 70% stenosed.  Mid LAD lesion 50% stenosed  Mid LAD lesion is 50% stenosed.  Mid LAD to Dist LAD lesion 50% stenosed  Mid LAD to Dist LAD lesion is 50% stenosed.  Left Circumflex  Prox Cx to Mid Cx lesion 30% stenosed  Prox Cx to Mid Cx lesion is 30% stenosed.  Third Obtuse Marginal Branch  Ost 3rd Mrg lesion 50% stenosed  Ost 3rd Mrg lesion is 50% stenosed.  Right Coronary Artery  Vessel is large.  Prox RCA-1 lesion 30% stenosed  Prox RCA-1 lesion is 30% stenosed. The lesion is calcified.  Prox RCA-2 lesion 95% stenosed  Prox RCA-2 lesion is 95% stenosed. The lesion is calcified.  Mid RCA to Dist RCA lesion 20% stenosed  Mid RCA to Dist RCA lesion is 20% stenosed.  Right  Posterior Descending Artery  Ost RPDA lesion 30% stenosed  Ost RPDA lesion is 30% stenosed.  Intervention   Prox RCA-2 lesion  Stent  Pre-stent angioplasty was performed using a BALLOON SAPPHIRE 2.5X12. A drug-eluting stent was successfully placed using a STENT SYNERGY DES 2.75X20. Post-stent angioplasty was performed using a BALLOON SAPPHIRE Dubois 3.0X12.  Post-Intervention Lesion Assessment  The intervention was successful. Pre-interventional TIMI flow is 3. Post-intervention TIMI flow is 3. No complications occurred at this lesion.  There is no residual stenosis post intervention.  Right Heart   Right Heart Pressures Elevated LV EDP consistent with volume overload.  Coronary Diagrams   Diagnostic Diagram       Post-Intervention Diagram          Cardiac TAVR CT  TECHNIQUE: The patient was scanned on a Siemens Force 192 slice scanner. A 120 kV retrospective scan was triggered in the descending thoracic aorta at 111 HU's. Gantry rotation  speed was 270 msecs and collimation was .9 mm. No beta blockade or nitro were given. The 3D data set was reconstructed in 5% intervals of the R-R cycle. Systolic and diastolic phases were analyzed on a dedicated work station using MPR, MIP and VRT modes. The patient received 80 cc of contrast.  FINDINGS: Aortic Valve: Tri-leaflet and severely calcified with restricted motion  Aorta: No aneurysm with moderate calcific atherosclerosis normal arch vessels and horizontal annulus  Sinotubular Junction: 23 mm  Ascending Thoracic Aorta: 34 mm  Aortic Arch: 27 mm  Descending Thoracic Aorta: 25 mm  Sinus of Valsalva Measurements:  Non-coronary: 30 mm  Right - coronary: 27.5 mm  Left - coronary: 28.9 mm  Coronary Artery Height above Annulus:  Left Main: 12 mm above annulus  Right Coronary: 12.7 mm above annulus  Virtual Basal Annulus Measurements:  Maximum/Minimum Diameter: Circular annulus 21.6 mm x 20.9  mm  Perimeter: 70.4 mm  Area: 357 mm2  Coronary Arteries: Sufficient height above annulus for deployment  Optimum Fluoroscopic Angle for Delivery: LAO 21 degrees Caudal 4 degrees  IMPRESSION: 1. Calcified tri leaflet AV with annulus of 357 mm2 suitable for a 23 mm Sapien 3 valve  2.  Normal aortic root 3.4 cm  3.  Coronary Arteries sufficient height above annulus for deployment  4. Optimum angiographic angle for deployment LAO 21 degrees Caudal 4 degrees  5.  No LAA thrombus  6.  Patent stent in mid RCA  Charlton Haws   Electronically Signed   By: Charlton Haws M.D.   On: 11/23/2017 15:23       CT ANGIOGRAPHY CHEST, ABDOMEN AND PELVIS  TECHNIQUE: Multidetector CT imaging through the chest, abdomen and pelvis was performed using the standard protocol during bolus administration of intravenous contrast. Multiplanar reconstructed images and MIPs were obtained and reviewed to evaluate the vascular anatomy.  CONTRAST:  ISOVUE-370 IOPAMIDOL (ISOVUE-370) INJECTION 76%  COMPARISON:  None.  FINDINGS: CTA CHEST FINDINGS  Cardiovascular: Heart size is mildly enlarged. There is no significant pericardial fluid, thickening or pericardial calcification. There is aortic atherosclerosis, as well as atherosclerosis of the great vessels of the mediastinum and the coronary arteries, including calcified atherosclerotic plaque in the left main, left anterior descending, left circumflex and right coronary arteries. Severe thickening calcification of the aortic valve. Mild calcifications of the mitral valve and subvalvular apparatus.  Mediastinum/Lymph Nodes: No pathologically enlarged mediastinal or hilar lymph nodes. Esophagus is unremarkable in appearance. No axillary lymphadenopathy.  Lungs/Pleura: Trace left pleural effusion lying dependently. Areas of scarring and/or subsegmental atelectasis in the left lung base. No acute consolidative  airspace disease. No pleural effusions. No suspicious appearing pulmonary nodules or masses.  Musculoskeletal/Soft Tissues: There are no aggressive appearing lytic or blastic lesions noted in the visualized portions of the skeleton.  CTA ABDOMEN AND PELVIS FINDINGS  Hepatobiliary: Multiple small low-attenuation lesions throughout the liver, largest of which measures 1.4 cm in segment 7 (axial image 96 of series 15), compatible with a simple cyst. The smaller lesions are too small to definitively characterize, but are statistically likely tiny cysts or biliary hamartomas. No intra or extrahepatic biliary ductal dilatation. Gallbladder is normal in appearance.  Pancreas: No pancreatic mass. No pancreatic ductal dilatation. No pancreatic or peripancreatic fluid or inflammatory changes.  Spleen: Unremarkable.  Adrenals/Urinary Tract: 5 mm nonobstructive calculus in the lower pole collecting system of the right kidney. Several low-attenuation lesions in both kidneys, most of which are too small to characterize, but favored to  represent tiny cysts. The largest of these is an exophytic simple cyst in the upper pole of the left kidney measuring 1.6 cm in diameter. No definite suspicious renal lesions. Bilateral adrenal glands are normal in appearance. No hydroureteronephrosis. Diverticulum measuring 2.9 cm from the superior aspect of the urinary bladder incidentally noted.  Stomach/Bowel: Normal appearance of the stomach. No pathologic dilatation of small bowel or colon. Normal appendix.  Vascular/Lymphatic: Aortic atherosclerosis, without evidence of dissection in the abdominal or pelvic vasculature. Vascular findings and measurements pertinent to potential TAVR procedure, as detailed below. No lymphadenopathy noted in the abdomen or pelvis.  Reproductive: Prostate gland is enlarged and heterogeneous in appearance with severe median lobe hypertrophy measuring approximately  4.5 x 5.5 x 8.5 cm. Seminal vesicles are unremarkable in appearance.  Other: No significant volume of ascites.  No pneumoperitoneum.  Musculoskeletal: Levoscoliosis of the lumbar spine with multilevel degenerative disc disease and facet arthropathy. There are no aggressive appearing lytic or blastic lesions noted in the visualized portions of the skeleton.  VASCULAR MEASUREMENTS PERTINENT TO TAVR:  AORTA:  Minimal Aortic Diameter-10 x 13 mm  Severity of Aortic Calcification-severe  RIGHT PELVIS:  Right Common Iliac Artery -  Minimal Diameter-6.0 x 5.4 mm  Tortuosity-mild  Calcification-severe  Right External Iliac Artery -  Minimal Diameter-6.9 x 4.3 mm  Tortuosity-moderate  Calcification-moderate  Right Common Femoral Artery -  Minimal Diameter-7.4 x 6.5 mm  Tortuosity-mild  Calcification-severe severe  LEFT PELVIS:  Left Common Iliac Artery -  Minimal Diameter-6.4 x 2.9 mm  Tortuosity-mild  Calcification -  Left External Iliac Artery -  Minimal Diameter-4.7 x 3.0 mm  Tortuosity-mild  Calcification-moderate  Left Common Femoral Artery -  Minimal Diameter-4.7 x 4.5 mm  Tortuosity-mild  Calcification-severe  Review of the MIP images confirms the above findings.  IMPRESSION: 1. Vascular findings and measurements pertinent to potential TAVR procedure, as detailed above. 2. Severe thickening calcification of the aortic valve, compatible with the reported clinical history of severe aortic stenosis. 3. Aortic atherosclerosis, in addition to left main and 3 vessel coronary artery disease. 4. Trace left pleural effusion lying dependently. 5. 5 mm nonobstructive calculus in the lower pole collecting system of the right kidney. 6. Additional incidental findings, as above.  Aortic Atherosclerosis (ICD10-I70.0).   Electronically Signed   By: Trudie Reed M.D.   On: 11/23/2017  15:30     Impression:  Patient has stage D severe symptomatic aortic stenosis.  He originally presented in early March with a syncopal episode in association with a stroke.  He has had a second syncopal episode since then.  He describes progressive exertional fatigue and generalized weakness, consistent with chronic diastolic congestive heart failure New York Heart Association functional class II.  He appears to have recovered reasonably well from his recent stroke although he remains fairly weak in his mobility is clearly not what it used to be.  He has been seen recently by his neurologist who felt that proceeding with definitive management of his aortic stenosis in the near future was reasonable.  I agree the patient would benefit from aortic valve replacement.  However, I would not consider this elderly gentleman a candidate for conventional surgical aortic valve replacement under any circumstances.  Options include continued medical therapy versus transcatheter aortic valve replacement.  Cardiac-gated CTA of the heart reveals anatomical characteristics consistent with aortic stenosis suitable for treatment by transcatheter aortic valve replacement without any significant complicating features and CTA of the aorta and iliac vessels demonstrate  what appears to be borderline but adequate pelvic vascular access to facilitate a transfemoral approach on the right side.  The patient has severe disease involving the left common iliac artery.    Plan:  The patient and his nephew were counseled at length regarding treatment alternatives for management of severe symptomatic aortic stenosis. Alternative approaches such as conventional aortic valve replacement, transcatheter aortic valve replacement, and continued medical therapy without intervention were compared and contrasted at length.  The risks associated with conventional surgical aortic valve replacement were discussed in detail, as were expectations  for post-operative convalescence, and why I would be reluctant to consider this patient a candidate for conventional surgery.  Issues specific to transcatheter aortic valve replacement were discussed including questions about long term valve durability, the potential for paravalvular leak, possible increased risk of need for permanent pacemaker placement, and other technical complications related to the procedure itself.  Long-term prognosis with medical therapy was discussed. This discussion was placed in the context of the patient's own specific clinical presentation and past medical history.  All of their questions have been addressed.  The patient desires to proceed with transcatheter aortic valve replacement as soon as practical.  We tentatively plan for surgery on January 12, 2018.  Following the decision to proceed with transcatheter aortic valve replacement, a discussion has been held regarding what types of management strategies would be attempted intraoperatively in the event of life-threatening complications, including whether or not the patient would be considered a candidate for the use of cardiopulmonary bypass and/or conversion to open sternotomy for attempted surgical intervention.  The patient has been advised of a variety of complications that might develop including but not limited to risks of death, stroke, paravalvular leak, aortic dissection or other major vascular complications, aortic annulus rupture, device embolization, cardiac rupture or perforation, mitral regurgitation, acute myocardial infarction, arrhythmia, heart block or bradycardia requiring permanent pacemaker placement, congestive heart failure, respiratory failure, renal failure, pneumonia, infection, other late complications related to structural valve deterioration or migration, or other complications that might ultimately cause a temporary or permanent loss of functional independence or other long term morbidity.  The patient  provides full informed consent for the procedure as described and all questions were answered.    I spent in excess of 90 minutes during the conduct of this office consultation and >50% of this time involved direct face-to-face encounter with the patient for counseling and/or coordination of their care.    Salvatore Decent. Cornelius Moras, MD 12/23/2017 4:17 PM

## 2017-12-24 ENCOUNTER — Other Ambulatory Visit: Payer: Self-pay | Admitting: Pharmacist

## 2017-12-24 NOTE — Patient Outreach (Signed)
[facility]) Care Management  12/24/2017  Samuel Vega February 09, 1924 [id number]  Incoming call and voicemail received on 12/23/2017 from Mr. Kunin nephew, Samuel Vega.  Return call placed to [facility] today.  I left a HIPAA compliant voicemail for Samuel Vega and he returned my call.   Samuel Vega reports that alprazolam and trazodone doses were both recently reduced and patient seems to being better.  Samuel Vega reports that patient's sleeping was undisturbed last night and patient states he is feeling more in control of himself.  [facility] requests information on what to monitor for with these medications.    We reviewed possible adverse side effects with alprazolam and trazodone, going into detail regarding CNS side effects (drowsiness, dizziness, HA, nervousness, fatigue, sedation) and that alprazolam is identified in the Beers Criteria as a potentially inappropriate medication to be avoided in patients 65 years and older (independent of diagnosis or condition) due to increased risk of impaired cognition, delirium, falls, fractures, and motor vehicle accidents with benzodiazepine use.Samuel Vega voiced understanding and will continue to assist patient with medications along with his sister.  He reports he has an appointment with [facility] tomorrow.  He has no other medication questions or concerns at this time.    Plan: I will route my note to PA to provide summary of my conversation with patient's nephew.  I will close patient case at this time as no further [facility] pharmacy needs identified.  Nephew has my contact information if he would like to contact me in the future.   Haynes Hoehn, PharmD, [facility] Clinical Pharmacist [facility] [employer] [phone]

## 2017-12-25 ENCOUNTER — Other Ambulatory Visit: Payer: Self-pay

## 2017-12-25 ENCOUNTER — Ambulatory Visit (INDEPENDENT_AMBULATORY_CARE_PROVIDER_SITE_OTHER): Payer: PPO | Admitting: Physician Assistant

## 2017-12-25 ENCOUNTER — Ambulatory Visit: Payer: PPO | Admitting: *Deleted

## 2017-12-25 ENCOUNTER — Encounter: Payer: Self-pay | Admitting: Physician Assistant

## 2017-12-25 ENCOUNTER — Ambulatory Visit: Payer: PPO | Admitting: Physician Assistant

## 2017-12-25 VITALS — BP 118/76 | HR 84 | Temp 97.7°F | Resp 16 | Ht 68.0 in | Wt 161.0 lb

## 2017-12-25 DIAGNOSIS — K5900 Constipation, unspecified: Secondary | ICD-10-CM | POA: Diagnosis not present

## 2017-12-25 DIAGNOSIS — G4700 Insomnia, unspecified: Secondary | ICD-10-CM

## 2017-12-25 DIAGNOSIS — Z9181 History of falling: Secondary | ICD-10-CM | POA: Diagnosis not present

## 2017-12-25 DIAGNOSIS — I350 Nonrheumatic aortic (valve) stenosis: Secondary | ICD-10-CM

## 2017-12-25 NOTE — Progress Notes (Signed)
Patient ID: Samuel Vega, male    DOB: [birth year], 84 y.o.   MRN: [id number]  PCP: Porfirio Oar, PA-C  Chief Complaint  Patient presents with  . Insomnia    follow up  . Aortic Stenosis    follow up     Subjective:   Presents for evaluation of insomnia and constipation.  He is accompanied by his nephew, Samuel Vega.  At his last visit we increased trazodone from 50 mg to 100 mg, in hopes of improving his sleep. Unfortunately, he seemed oversedated, several "slid out of bed" episodes.  About a week ago, Samuel Vega stopped by the office and I recommended reducing the trazodone back to 50 mg at at bedtime and using the alprazolam as needed, as it seemed less sedating.  He does have an emergency call button that he wears around his neck.  On several occasions he did not use it, not wanting to bother his nephew.  They had a conversation about that this morning, when the [facility] arrived to find the patient on the floor.  He had apparently "slid out of bed" several minutes after [facility] left yesterday evening and stayed on the floor all night.  The patient is resistant to having 24-hour care due to cost.  In addition, he is experiencing severe constipation.  He and Samuel Vega are concerned that medications are contributing.  Twice daily Colace has been ineffective.  The patient has been using his finger to remove small amounts of stool from the rectum.  Saw Dr. Cornelius Moras 12/23/2017 for second opinion regarding options for management of severe/critical and symptomatic aortic stenosis.  He continues to experience extreme fatigue.  Difficulty walking to the bathroom at home.  Often feels too weak in the legs to get there. He is scheduled for transcatheter aortic valve replacement 01-17-2018.   Review of Systems No CP, HA, dizziness. No nausea, vomiting, diarrhea. No urinary symptoms.    Patient Active Problem List   Diagnosis Date Noted  . Insomnia 12/21/2017  . HTN (hypertension) 11/05/2017  . S/P  right coronary artery (RCA) stent placement 11/05/2017  . Coronary artery disease involving native coronary artery of native heart with unstable angina pectoris (HCC)   . Seizure (HCC) 10/26/2017  . Dementia without behavioral disturbance   . Syncope   . Carotid stenosis, right   . Cerebrovascular accident (CVA) due to embolism of right anterior cerebral artery (HCC)   . Loss of weight 03/20/2014  . Chronic back pain   . Hyperlipidemia 07/29/2012  . Hearing loss 07/29/2012  . Macular degeneration   . Tinnitus   . ED (erectile dysfunction)   . PTSD (post-traumatic stress disorder)   . Aortic stenosis, severe 09/11/2009     Prior to Admission medications   Medication Sig Start Date End Date Taking? Authorizing Provider  ALPRAZolam (XANAX) 0.25 MG tablet Take 0.25 mg by mouth 2 (two) times daily as needed for anxiety.   Yes [provider]  aspirin 81 MG chewable tablet Chew 1 tablet (81 mg total) by mouth daily. 12/15/17  Yes Dalon Reichart, PA-C  clopidogrel (PLAVIX) 75 MG tablet Take 1 tablet (75 mg total) by mouth daily with breakfast. 12/15/17  Yes Zynasia Burklow, PA-C  docusate sodium (COLACE) 100 MG capsule Take 1 capsule (100 mg total) by mouth daily as needed for mild constipation. 12/15/17  Yes Joyice Magda, PA-C  levETIRAcetam (KEPPRA) 500 MG tablet Take 1 tablet (500 mg total) by mouth 2 (two) times daily. 12/15/17  Yes Shade Rivenbark, PA-C  nitroGLYCERIN (NITROSTAT) 0.4 MG SL tablet Place 1 tablet (0.4 mg total) under the tongue every 5 (five) minutes as needed for chest pain. 12/15/17  Yes Helga Asbury, PA-C  rosuvastatin (CRESTOR) 10 MG tablet Take 1 tablet (10 mg total) by mouth daily. 12/16/17  Yes Kathleene Hazel, MD  traZODone (DESYREL) 100 MG tablet Take 1 tablet (100 mg total) by mouth at bedtime. Patient taking differently: Take 50 mg by mouth at bedtime.  12/15/17  Yes Porfirio Oar, PA-C     Allergies  Allergen Reactions  . Statins Other (See  Comments)    MYALGIAS       Objective:  Physical Exam  Constitutional: He is oriented to person, place, and time. He appears well-developed and well-nourished. He is active and cooperative. No distress.  BP 118/76   Pulse 84   Temp 97.7 F (36.5 C)   Resp 16   Ht  (1.727 m)   Wt 161 lb (73 kg)   SpO2 97%   BMI 24.48 kg/m  Seated in wheelchair.  Frequently repositions himself due to back discomfort.  Eyes: Pupils are equal, round, and reactive to light. Conjunctivae and EOM are normal.  Neck: Neck supple. No thyromegaly present.  Cardiovascular: Normal rate and regular rhythm.  Murmur heard. Pulmonary/Chest: Effort normal.  Neurological: He is alert and oriented to person, place, and time.  Skin: Skin is warm and dry.  Psychiatric: He has a normal mood and affect. His speech is normal and behavior is normal.   Wt Readings from Last 3 Encounters:  12/25/17 161 lb (73 kg)  12/23/17 161 lb (73 kg)  12/22/17 161 lb (73 kg)      Assessment & Plan:   Problem List Items Addressed This Visit    Aortic stenosis, severe    Likely the cause, or at least major contributor, of his extreme fatigue.  Proceed with surgery planned for 6/4.      Insomnia - Primary    Long-standing.  Now complicated by extreme fatigue, most likely due to aortic stenosis.  He is at significantly increased fall risk.  Reduce trazodone from 50 mg to 25 mg.  Use alprazolam at bedtime if needed.       Other Visit Diagnoses    Constipation, unspecified constipation type       Severe.  Magnesium titrate tonight and tomorrow morning.  Saline enema every 12 hours for the next several days or as needed.   At high risk for falls       Encouraged 24-hour supervision.  Encourage use of bedside urinal at night.  They have engaged with home health for additional evaluation.  Continue PT and OT.       Return in about 1 week (around 01/01/2018) for re-evaluation.   Fernande Bras, PA-C Primary Care at  [facility]

## 2017-12-25 NOTE — Patient Instructions (Addendum)
TRAZODONE: REDUCE to 25 mg at bedtime (if the pill cutter doesn't work to get 1/4 tablet, let me know. I'll write a new prescription, and send it to Dr. Jeri Lager). ALPRAZOLAM (Xanax): REDUCE to 0.25 mg 1-2 times each day, as needed.  FOR CONSTIPATION: -Magnesium Citrate, drink 1 bottle tonight and tomorrow morning. -Saline enema, use one every 12 hours AS NEEDED -Start Miralax 17 g in 8 ounces of water every day, unless you develop diarrhea.  Until your strength is back, you need to be attended ALL THE TIME, including at night. You should not get up to the bathroom without someone RIGHT THERE to help you. I recommend that you use a bedside urinal at night, so that you don't have to get out of bed.   IF you received an x-ray today, you will receive an invoice from [facility]. Please contact [facility] at [phone] with questions or concerns regarding your invoice.   IF you received labwork today, you will receive an invoice from [facility]. Please contact [facility] at [phone] with questions or concerns regarding your invoice.   Our billing staff will not be able to assist you with questions regarding bills from these companies.  You will be contacted with the lab results as soon as they are available. The fastest way to get your results is to activate your My Chart account. Instructions are located on the last page of this paperwork. If you have not heard from [place] regarding the results in 2 weeks, please contact this office.

## 2017-12-26 DIAGNOSIS — K5900 Constipation, unspecified: Secondary | ICD-10-CM | POA: Insufficient documentation

## 2017-12-26 DIAGNOSIS — Z9181 History of falling: Secondary | ICD-10-CM | POA: Insufficient documentation

## 2017-12-26 NOTE — Assessment & Plan Note (Signed)
Likely the cause, or at least major contributor, of his extreme fatigue.  Proceed with surgery planned for 6/4.

## 2017-12-26 NOTE — Assessment & Plan Note (Signed)
Long-standing.  Now complicated by extreme fatigue, most likely due to aortic stenosis.  He is at significantly increased fall risk.  Reduce trazodone from 50 mg to 25 mg.  Use alprazolam at bedtime if needed.

## 2017-12-28 ENCOUNTER — Other Ambulatory Visit: Payer: Self-pay | Admitting: *Deleted

## 2017-12-28 NOTE — Patient Outreach (Signed)
[facility]) Care Management  12/28/2017  Samuel Vega [birth year] [id number]   Weekly transition of care call placed to member's nephew, [name].  No answer, HIPAA compliant voice message left.  Will await call back, if no call back will follow up within the next 4 business days.  Kemper Durie, [place], MSN [facility] Care Management  [facility] Manager [phone]

## 2017-12-29 ENCOUNTER — Other Ambulatory Visit: Payer: Self-pay | Admitting: *Deleted

## 2017-12-29 NOTE — Patient Outreach (Signed)
[facility]) Care Management  12/29/2017  Samuel Vega [birth year] [id number]   CSW made phone outreach to pt's nephew, [name], who confirmed pt's identity and reports that he is the  "unofficial pokesperson" for pt and family.   CSW was only able to speak briefly with [name] who requested he would call me back later; stating "I am working and behind because of being awake talking to my uncle at 3am this morning".   CSW will await call back or reach back out later this week if no return call received.   [name], MSW, LCSW Clinical Social Worker  [facility] [employer] [phone]

## 2017-12-31 ENCOUNTER — Other Ambulatory Visit: Payer: Self-pay | Admitting: *Deleted

## 2017-12-31 NOTE — Patient Outreach (Signed)
[facility]) Care Management  12/31/2017  Samuel Vega 09/02/1923 [id number]   CSW spoke with pt's nephew, Debria Garret, who reports pt is doing well at home. Pt has HH services including PT, OT, Speech and Nursing and per nephew, they are working to make some adjustments in the home.  Pt has PCP visit on Friday at [facility] and nephew plans to take him there. He reports they have made some changes to his "night" meds to include changing the Trazadone and Xanax to PRN due to symptoms of being "sluggish and low energy".  He also has plans for heart surgery June 4th and the nephew feels they have things under control well for now; hoping the HHPT will be extended to cover him until the surgery.  CSW discussed possible post surgery needs for more assistance in home and/or possible SNF return.    CSW will plan f/u call prior to his surgery to assess and assist further.     [name], MSW, LCSW Clinical Social Worker  [facility] [phone]

## 2018-01-01 ENCOUNTER — Other Ambulatory Visit: Payer: Self-pay | Admitting: *Deleted

## 2018-01-01 ENCOUNTER — Telehealth: Payer: Self-pay | Admitting: Physician Assistant

## 2018-01-01 NOTE — Telephone Encounter (Signed)
Provider, FYI. 

## 2018-01-01 NOTE — Telephone Encounter (Signed)
Copied from CRM [id number]. Topic: Quick Communication - See Telephone Encounter >> Jan 01, 2018 11:20 AM Diana Eves B wrote: CRM for notification. See Telephone encounter for: 01/01/18. Isha with [facility] calling to make provider aware that they didn't go see the pt today and will try to next week. The pts son called this morning and said the pt was very agitated today and it would be best to reschedule. CB# [phone].

## 2018-01-01 NOTE — Patient Outreach (Signed)
[facility]) Care Management  01/01/2018  Samuel Vega [birth year] [id number]   Call/voice message received back from nephew after missed call earlier this week.  Call placed for transition of care/follow up, no answer.  HIPAA compliant voice message left.  Will follow up within the next 4 business days.  Kemper Durie, [place], MSN [facility] Care Management  [facility] Manager [phone]

## 2018-01-06 ENCOUNTER — Ambulatory Visit: Payer: PPO | Admitting: Physician Assistant

## 2018-01-07 NOTE — Pre-Procedure Instructions (Addendum)
Samuel Vega  01/07/2018     No Pharmacies Listed   Your procedure is scheduled on Tues., 02-11-18 from 7:30AM- 9:35AM  Report to [facility] Admitting Entrance "A" at 5:30AM  Call this number if you have problems the morning of surgery:  [phone]   Remember:  No food or drinks after midnight on June 3rd  Take these medicines the morning of surgery with A SIP OF WATER: NONE  Follow your doctors instructions regarding your Aspirin and Plavix.  If no instructions were given by your doctor, then you will need to call the prescribing office office to get instructions.    As of today, stop taking all Other Aspirin Products, Vitamins, Fish oils, and Herbal medications. Also stop all NSAIDS i.e. Advil, Ibuprofen, Motrin, Aleve, Anaprox, Naproxen, BC and Goody Powders.    Do not wear jewelry.  Do not wear lotions, powders, colognes, or deodorant.  Do not shave 48 hours prior to surgery.  Men may shave face.  Do not bring valuables to the hospital.  [facility] is not responsible for any belongings or valuables.  Contacts, dentures or bridgework may not be worn into surgery.  Leave your suitcase in the car.  After surgery it may be brought to your room.  For patients admitted to the hospital, discharge time will be determined by your treatment team.  Patients discharged the day of surgery will not be allowed to drive home.   Special instructions:   [place]- Preparing For Surgery  Before surgery, you can play an important role. Because skin is not sterile, your skin needs to be as free of germs as possible. You can reduce the number of germs on your skin by washing with CHG (chlorahexidine gluconate) Soap before surgery.  CHG is an antiseptic cleaner which kills germs and bonds with the skin to continue killing germs even after washing.    Oral Hygiene is also important to reduce your risk of infection.  Remember - BRUSH YOUR TEETH THE MORNING OF SURGERY  WITH YOUR REGULAR TOOTHPASTE  Please do not use if you have an allergy to CHG or antibacterial soaps. If your skin becomes reddened/irritated stop using the CHG.  Do not shave (including legs and underarms) for at least 48 hours prior to first CHG shower. It is OK to shave your face.  Please follow these instructions carefully.   1. Shower the NIGHT BEFORE SURGERY and the MORNING OF SURGERY with CHG.   2. If you chose to wash your hair, wash your hair first as usual with your normal shampoo.  3. After you shampoo, rinse your hair and body thoroughly to remove the shampoo.  4. Use CHG as you would any other liquid soap. You can apply CHG directly to the skin and wash gently with a scrungie or a clean washcloth.   5. Apply the CHG Soap to your body ONLY FROM THE NECK DOWN.  Do not use on open wounds or open sores. Avoid contact with your eyes, ears, mouth and genitals (private parts). Wash Face and genitals (private parts)  with your normal soap.  6. Wash thoroughly, paying special attention to the area where your surgery will be performed.  7. Thoroughly rinse your body with warm water from the neck down.  8. DO NOT shower/wash with your normal soap after using and rinsing off the CHG Soap.  9. Pat yourself dry with a CLEAN TOWEL.  10. Wear CLEAN PAJAMAS to  bed the night before surgery, wear comfortable clothes the morning of surgery  11. Place CLEAN SHEETS on your bed the night of your first shower and DO NOT SLEEP WITH PETS.  Day of Surgery:  Do not apply any deodorants/lotions.  Please wear clean clothes to the hospital/surgery center.   Remember to brush your teeth WITH YOUR REGULAR TOOTHPASTE.  Please read over the following fact sheets that you were given. Pain Booklet, Coughing and Deep Breathing, Blood Transfusion Information, MRSA Information and Surgical Site Infection Prevention

## 2018-01-08 ENCOUNTER — Other Ambulatory Visit: Payer: Self-pay | Admitting: *Deleted

## 2018-01-08 ENCOUNTER — Encounter (HOSPITAL_COMMUNITY)
Admission: RE | Admit: 2018-01-08 | Discharge: 2018-01-08 | Disposition: A | Discharge status: Home / Self Care | Payer: PPO | Source: Ambulatory Visit | Attending: Cardiovascular Disease | Admitting: Cardiovascular Disease

## 2018-01-08 ENCOUNTER — Other Ambulatory Visit: Payer: Self-pay

## 2018-01-08 ENCOUNTER — Other Ambulatory Visit (HOSPITAL_COMMUNITY): Payer: PPO

## 2018-01-08 ENCOUNTER — Telehealth: Payer: Self-pay | Admitting: Pharmacist

## 2018-01-08 ENCOUNTER — Encounter (HOSPITAL_COMMUNITY): Payer: Self-pay

## 2018-01-08 DIAGNOSIS — N529 Male erectile dysfunction, unspecified: Secondary | ICD-10-CM | POA: Insufficient documentation

## 2018-01-08 DIAGNOSIS — I10 Essential (primary) hypertension: Secondary | ICD-10-CM | POA: Insufficient documentation

## 2018-01-08 DIAGNOSIS — I2510 Atherosclerotic heart disease of native coronary artery without angina pectoris: Secondary | ICD-10-CM | POA: Insufficient documentation

## 2018-01-08 DIAGNOSIS — I69354 Hemiplegia and hemiparesis following cerebral infarction affecting left non-dominant side: Secondary | ICD-10-CM | POA: Insufficient documentation

## 2018-01-08 DIAGNOSIS — Z7982 Long term (current) use of aspirin: Secondary | ICD-10-CM | POA: Insufficient documentation

## 2018-01-08 DIAGNOSIS — I491 Atrial premature depolarization: Secondary | ICD-10-CM | POA: Insufficient documentation

## 2018-01-08 DIAGNOSIS — J90 Pleural effusion, not elsewhere classified: Secondary | ICD-10-CM | POA: Insufficient documentation

## 2018-01-08 DIAGNOSIS — H3530 Unspecified macular degeneration: Secondary | ICD-10-CM | POA: Insufficient documentation

## 2018-01-08 DIAGNOSIS — Z79899 Other long term (current) drug therapy: Secondary | ICD-10-CM | POA: Insufficient documentation

## 2018-01-08 DIAGNOSIS — F4310 Post-traumatic stress disorder, unspecified: Secondary | ICD-10-CM | POA: Insufficient documentation

## 2018-01-08 DIAGNOSIS — E785 Hyperlipidemia, unspecified: Secondary | ICD-10-CM | POA: Insufficient documentation

## 2018-01-08 DIAGNOSIS — F329 Major depressive disorder, single episode, unspecified: Secondary | ICD-10-CM | POA: Insufficient documentation

## 2018-01-08 DIAGNOSIS — I700 Atherosclerosis of aorta: Secondary | ICD-10-CM | POA: Insufficient documentation

## 2018-01-08 DIAGNOSIS — Z955 Presence of coronary angioplasty implant and graft: Secondary | ICD-10-CM | POA: Insufficient documentation

## 2018-01-08 DIAGNOSIS — I350 Nonrheumatic aortic (valve) stenosis: Secondary | ICD-10-CM | POA: Insufficient documentation

## 2018-01-08 DIAGNOSIS — Z87442 Personal history of urinary calculi: Secondary | ICD-10-CM | POA: Insufficient documentation

## 2018-01-08 DIAGNOSIS — I252 Old myocardial infarction: Secondary | ICD-10-CM | POA: Diagnosis not present

## 2018-01-08 DIAGNOSIS — Z01812 Encounter for preprocedural laboratory examination: Secondary | ICD-10-CM | POA: Insufficient documentation

## 2018-01-08 DIAGNOSIS — Z87891 Personal history of nicotine dependence: Secondary | ICD-10-CM | POA: Insufficient documentation

## 2018-01-08 DIAGNOSIS — Z01818 Encounter for other preprocedural examination: Secondary | ICD-10-CM | POA: Diagnosis not present

## 2018-01-08 LAB — BLOOD GAS, ARTERIAL
Acid-Base Excess: 1.7 mmol/L (ref 0.0–2.0)
BICARBONATE: 25.4 mmol/L (ref 20.0–28.0)
Drawn by: 421801
FIO2: 21
O2 Saturation: 97.7 %
PH ART: 7.443 (ref 7.350–7.450)
PO2 ART: 95.7 mmHg (ref 83.0–108.0)
Patient temperature: 98.6
pCO2 arterial: 37.7 mmHg (ref 32.0–48.0)

## 2018-01-08 LAB — COMPREHENSIVE METABOLIC PANEL
ALK PHOS: 69 U/L (ref 38–126)
ALT: 11 U/L — AB (ref 17–63)
ANION GAP: 9 (ref 5–15)
AST: 21 U/L (ref 15–41)
Albumin: 3.1 g/dL — ABNORMAL LOW (ref 3.5–5.0)
BILIRUBIN TOTAL: 0.6 mg/dL (ref 0.3–1.2)
BUN: 10 mg/dL (ref 6–20)
CO2: 22 mmol/L (ref 22–32)
Calcium: 8.7 mg/dL — ABNORMAL LOW (ref 8.9–10.3)
Chloride: 108 mmol/L (ref 101–111)
Creatinine, Ser: 0.92 mg/dL (ref 0.61–1.24)
GLUCOSE: 100 mg/dL — AB (ref 65–99)
POTASSIUM: 3.8 mmol/L (ref 3.5–5.1)
Sodium: 139 mmol/L (ref 135–145)
TOTAL PROTEIN: 6.1 g/dL — AB (ref 6.5–8.1)

## 2018-01-08 LAB — URINALYSIS, ROUTINE W REFLEX MICROSCOPIC
BACTERIA UA: NONE SEEN
Bilirubin Urine: NEGATIVE
Glucose, UA: NEGATIVE mg/dL
KETONES UR: NEGATIVE mg/dL
LEUKOCYTES UA: NEGATIVE
Nitrite: NEGATIVE
PROTEIN: NEGATIVE mg/dL
Specific Gravity, Urine: 1.017 (ref 1.005–1.030)
pH: 5 (ref 5.0–8.0)

## 2018-01-08 LAB — SURGICAL PCR SCREEN
MRSA, PCR: NEGATIVE
Staphylococcus aureus: NEGATIVE

## 2018-01-08 LAB — CBC
HEMATOCRIT: 35.2 % — AB (ref 39.0–52.0)
HEMOGLOBIN: 11.1 g/dL — AB (ref 13.0–17.0)
MCH: 26.9 pg (ref 26.0–34.0)
MCHC: 31.5 g/dL (ref 30.0–36.0)
MCV: 85.2 fL (ref 78.0–100.0)
PLATELETS: 217 10*3/uL (ref 150–400)
RBC: 4.13 MIL/uL — AB (ref 4.22–5.81)
RDW: 14.4 % (ref 11.5–15.5)
WBC: 5.3 10*3/uL (ref 4.0–10.5)

## 2018-01-08 LAB — APTT: aPTT: 39 seconds — ABNORMAL HIGH (ref 24–36)

## 2018-01-08 LAB — HEMOGLOBIN A1C
Hgb A1c MFr Bld: 4.7 % — ABNORMAL LOW (ref 4.8–5.6)
MEAN PLASMA GLUCOSE: 88.19 mg/dL

## 2018-01-08 LAB — ABO/RH: ABO/RH(D): O POS

## 2018-01-08 LAB — BRAIN NATRIURETIC PEPTIDE: B Natriuretic Peptide: 575.2 pg/mL — ABNORMAL HIGH (ref 0.0–100.0)

## 2018-01-08 LAB — PROTIME-INR
INR: 1
PROTHROMBIN TIME: 13.1 s (ref 11.4–15.2)

## 2018-01-08 LAB — TYPE AND SCREEN
ABO/RH(D): O POS
ANTIBODY SCREEN: NEGATIVE

## 2018-01-08 NOTE — Telephone Encounter (Signed)
Algernon Huxley, patient's nephew, left voicemail in clinic stating that pt has not started Crestor yet. I had mailed a hard copy rx to the [place] same day as office visit 12/16/17 since they would not take a verbal order over the phone. Returned call to patient's nephew and left a message for them. Advised [facility] to [facility] since [facility] that was [facility] 3 weeks ago. Let them know that we can write new rx and mail to patient's home if they would like to bring hard copy to the [place] themselves. Will await return call from pt's nephew.

## 2018-01-08 NOTE — Progress Notes (Deleted)
Established Carotid Patient   History of Present Illness   Samuel Vega is a 84 y.o. (November 07, 1923) male who presents with chief complaint: ***.  Pt has was found down in early March.  He was found to have L side weakness and moderately large R CVA.  The patient also has critical AS.  He is being scheduled for TAVR.  ***The patient's previous neurologic deficits have *** resolved.  The patient's PMH, PSH, SH, and FamHx were reviewed on 01/10/2018 are unchanged from 10/27/17.  Current Outpatient Medications  Medication Sig Dispense Refill  . aspirin 81 MG chewable tablet Chew 1 tablet (81 mg total) by mouth daily. 60 tablet 3  . clopidogrel (PLAVIX) 75 MG tablet Take 1 tablet (75 mg total) by mouth daily with breakfast. 90 tablet 3  . docusate sodium (COLACE) 100 MG capsule Take 1 capsule (100 mg total) by mouth daily as needed for mild constipation. (Patient not taking: Reported on 01/01/2018) 90 capsule 3  . levETIRAcetam (KEPPRA) 500 MG tablet Take 1 tablet (500 mg total) by mouth 2 (two) times daily. 180 tablet 3  . nitroGLYCERIN (NITROSTAT) 0.4 MG SL tablet Place 1 tablet (0.4 mg total) under the tongue every 5 (five) minutes as needed for chest pain. 25 tablet 0  . polyethylene glycol (MIRALAX / GLYCOLAX) packet Take 17 g by mouth daily as needed for moderate constipation.    . traZODone (DESYREL) 100 MG tablet Take 1 tablet (100 mg total) by mouth at bedtime. (Patient not taking: Reported on 12/30/2017) 90 tablet 3   No current facility-administered medications for this visit.     On ROS today: ***, ***   Physical Examination  ***There were no vitals filed for this visit. ***There is no height or weight on file to calculate BMI.  General {LOC:19197::"Somulent","Alert"}, {Orientation:19197::"Confused","O x 3"}, {Weight:19197::"Obese","Cachectic","WD"}, {General state of health:19197::"Ill appearing","Elderly","NAD"}  Neck Supple, mid-line trachea, {NeckIncision:19197::"Neck  incision healed"," "}  Pulmonary {Chest wall:19197::"Asx chest movement","Sym exp"}, {Air movt:19197::"Decreased *** air movt","good B air movt"}, {BS:19197::"rales on ***","rhonchi on ***","wheezing on ***","CTA B"}  Cardiac {Rhythm:19197::"Irregularly, irregular rate and rhythm","RRR, Nl S1, S2"}, {Murmur:19197::"Murmur present: ***","no Murmurs"}, {Rubs:19197::"Rub present: ***","No rubs"}, {Gallop:19197::"Gallop present: ***","No S3,S4"}  Vascular Vessel Right Left  Radial {Palpable:19197::"Not palpable","Faintly palpable","Palpable"} {Palpable:19197::"Not palpable","Faintly palpable","Palpable"}  Brachial {Palpable:19197::"Not palpable","Faintly palpable","Palpable"} {Palpable:19197::"Not palpable","Faintly palpable","Palpable"}  Carotid Palpable, {Bruit:19197::"Bruit present","No Bruit"} Palpable, {Bruit:19197::"Bruit present","No Bruit"}  Aorta Not palpable N/A  Femoral {Palpable:19197::"Not palpable","Faintly palpable","Palpable"} {Palpable:19197::"Not palpable","Faintly palpable","Palpable"}  Popliteal {Palpable:19197::"Prominently palpable","Not palpable"} {Palpable:19197::"Prominently palpable","Not palpable"}  PT {Palpable:19197::"Not palpable","Faintly palpable","Palpable"} {Palpable:19197::"Not palpable","Faintly palpable","Palpable"}  DP {Palpable:19197::"Not palpable","Faintly palpable","Palpable"} {Palpable:19197::"Not palpable","Faintly palpable","Palpable"}    Gastro- intestinal soft, {Distension:19197::"distended","non-distended"}, {TTP:19197::"TTP in *** quadrant","appropriate tenderness to palpation","non-tender to palpation"}, {Guarding:19197::"Guarding and rebound in *** quadrant","No guarding or rebound"}, {HSM:19197::"HSM present","no HSM"}, {Masses:19197::"Mass present: ***","no masses"}, {Flank:19197::"CVAT on ***","Flank bruit present on ***","no CVAT B"}, {AAA:19197::"Palpable prominent aortic pulse","No palpable prominent aortic pulse"}, {Surgical incision:19197::"Surg  incisions: ***","Surgical incisions well healed"," "}  Musculo- skeletal M/S 5/5 throughout {MS:19197::"except ***"," "}, Extremities without ischemic changes {MS:19197::"except ***"," "}  Neurologic Cranial nerves 2-12 intact{CN:19197::" except ***"," "}, Pain and light touch intact in extremities{CN:19197::" except for decreased sensation in ***"," "}, Motor exam as listed above    Non-Invasive Vascular Imaging   B Carotid Duplex (***):   R ICA stenosis:  {Stenosis:19197::"Occluded","80-99%","60-79%","40-59%","1-39%"}  R VA: *** patent and antegrade  L ICA stenosis:  {Stenosis:19197::"Occluded","80-99%","60-79%","40-59%","1-39%"}  L VA: *** patent and antegrade   Medical Decision Making   Samuel Vega is a 84 y.o.  male who presents with: R CVA, sx RICA stenosis >70%, asx L ICA stenosis 1-39%   Pt is being scheduled for TAVR.  Depending on his recovery from the TAVR, could consider proceeding with R CEA.  However, the benefits are only realized if a patient lives for at least 3-5 years.  Obviously in a 84 year old this is unpredictable, limiting the benefits of any CEA.  Based on the patient's vascular studies and examination, I have offered the patient: ***.  I discussed in depth with the patient the nature of atherosclerosis, and emphasized the importance of maximal medical management including strict control of blood pressure, blood glucose, and lipid levels, antiplatelet agents, obtaining regular exercise, and cessation of smoking.    The patient is aware that without maximal medical management the underlying atherosclerotic disease process will progress, limiting the benefit of any interventions.  The patient is currently not on a statin due to reported allergy.   The patient is currently on an anti-platelet: ASA and Plavix.  Thank you for allowing us to participate in this patient's care.   Leonides SakeBrian Chen, MD, FACS [facility]  [place] Office: [phone] Pager: [phone]

## 2018-01-08 NOTE — Patient Outreach (Signed)
[facility]) Care Management  01/08/2018  Samuel Vega July 05, 1924 [id number]   CSW spoke with nephew today who reports plans are in place for pt to have his heart procedure next week and then he is hopeful he can "remain in hospital for his rehab".  CSW educated him on how insurance and his progress and needs would determine rehab setting.   Per nephew, they are managing well at home with [facility], family support and some caregiver assistance.   CSW will plan to check in post op for further needs and support.   [name], MSW, LCSW Clinical Social Worker  [facility] [employer] [phone]

## 2018-01-08 NOTE — Patient Outreach (Signed)
[facility]) Care Management  01/08/2018  KEONDRE MARKSON [birth year] [id number]   Weekly transition of care call placed to member's caregiver/nephew [name].  No answer, HIPAA compliant voice message left.  Unsuccessful outreach letters sent on 4/30 & 5/15.  Will await call back, if no call back will follow up within the next 4 business days.  Kemper Durie, [place], MSN [facility] Care Management  [facility] Manager [phone]

## 2018-01-08 NOTE — Progress Notes (Signed)
PCP - Dr. Elmarie Shileyiffany [place]/ [facility]  Cardiologist - Dr. Verdis PrimeHenry Vega  Chest x-ray - 01/08/18  EKG - 01/08/18  Stress Test - Denies  ECHO - 10/10/17 (E)  Cardiac Cath - 11/04/17 (E)  Sleep Study - Denies CPAP - None  LABS- 01/08/18: CBC, CMP, PT, PTT, ABG, BNP, T/S, UA  ASA- LD- 6/3 Plavix- LD- 6/3  HA1C- 01/08/18  Anesthesia- Yes- Cardiac history  Pt denies having chest pain, sob, or fever at this time. All instructions explained to the pt and family, with a verbal understanding of the material. Pt and family agrees to go over the instructions while at home for a better understanding. The opportunity to ask questions was provided.

## 2018-01-11 ENCOUNTER — Other Ambulatory Visit: Payer: Self-pay

## 2018-01-11 ENCOUNTER — Other Ambulatory Visit: Payer: Self-pay | Admitting: *Deleted

## 2018-01-11 DIAGNOSIS — I350 Nonrheumatic aortic (valve) stenosis: Secondary | ICD-10-CM

## 2018-01-11 MED ORDER — DEXMEDETOMIDINE HCL IN NACL 400 MCG/100ML IV SOLN
0.1000 ug/kg/h | INTRAVENOUS | Status: AC
Start: 2018-01-12 — End: 2018-01-12
  Administered 2018-01-12: 1 ug/kg/h via INTRAVENOUS
  Filled 2018-01-11: qty 100

## 2018-01-11 MED ORDER — POTASSIUM CHLORIDE 2 MEQ/ML IV SOLN
80.0000 meq | INTRAVENOUS | Status: DC
  Filled 2018-01-11: qty 40

## 2018-01-11 MED ORDER — VANCOMYCIN HCL 10 G IV SOLR
1250.0000 mg | INTRAVENOUS | Status: AC
  Administered 2018-01-12: 1250 mg via INTRAVENOUS
  Filled 2018-01-11: qty 1250

## 2018-01-11 MED ORDER — EPINEPHRINE PF 1 MG/ML IJ SOLN
0.0000 ug/min | INTRAVENOUS | Status: DC
  Filled 2018-01-11: qty 4

## 2018-01-11 MED ORDER — SODIUM CHLORIDE 0.9 % IV SOLN
INTRAVENOUS | Status: DC
  Filled 2018-01-11: qty 1

## 2018-01-11 MED ORDER — DOPAMINE-DEXTROSE 3.2-5 MG/ML-% IV SOLN
0.0000 ug/kg/min | INTRAVENOUS | Status: DC
Start: 2018-01-12 — End: 2018-01-12
  Filled 2018-01-11: qty 250

## 2018-01-11 MED ORDER — NOREPINEPHRINE 4 MG/250ML-% IV SOLN
0.0000 ug/min | INTRAVENOUS | Status: DC
  Filled 2018-01-11: qty 250

## 2018-01-11 MED ORDER — SODIUM CHLORIDE 0.9 % IV SOLN
INTRAVENOUS | Status: DC
  Filled 2018-01-11: qty 30

## 2018-01-11 MED ORDER — NITROGLYCERIN IN D5W 200-5 MCG/ML-% IV SOLN
2.0000 ug/min | INTRAVENOUS | Status: DC
  Filled 2018-01-11: qty 250

## 2018-01-11 MED ORDER — SODIUM CHLORIDE 0.9 % IV SOLN
1.5000 g | INTRAVENOUS | Status: AC
  Administered 2018-01-12: 1.5 g via INTRAVENOUS
  Filled 2018-01-11: qty 1.5

## 2018-01-11 MED ORDER — MAGNESIUM SULFATE 50 % IJ SOLN
40.0000 meq | INTRAMUSCULAR | Status: DC
  Filled 2018-01-11: qty 9.85

## 2018-01-11 MED ORDER — PHENYLEPHRINE HCL 10 MG/ML IJ SOLN
30.0000 ug/min | INTRAMUSCULAR | Status: DC
  Filled 2018-01-11: qty 2

## 2018-01-11 NOTE — Progress Notes (Signed)
Anesthesia Chart Review:  Case:  [id number] Date/Time:  02/06/2018 0715   Procedures:      TRANSCATHETER AORTIC VALVE REPLACEMENT, TRANSFEMORAL (N/A Chest)     TRANSESOPHAGEAL ECHOCARDIOGRAM (TEE) (N/A )   Anesthesia type:  General   Pre-op diagnosis:  Severe Aortic Stenosis   [facility]:  [facility] OR ROOM 16 / [facility] OR   Surgeon:  Tonny Bollmanooper, Michael, MD    CT surgeon is Evelene CroonBartle, Bryan, MD  DISCUSSION: Patient is a 84 year old male scheduled for the above procedure. History includes former smoker, severe AS, CAD/NSTEMI 10/08/17 s/p DES RCA 11/04/17 (residual LAD disease, medical therapy), CVA (left hemiparesis) 10/08/17 (post CVA seizure 10/26/17), severe RICA stenosis (diagnosed 10/09/17, for consideration of right CEA following recovery of cardiac intervention), PTSD.  Patient has neurology clearance for this procedure (see 12/22/17 note by George HughVanschaick, Jessica, NP). Patient is remaining in ASA and Plavix.  If no acute changes then I anticipate that he can proceed as planned.   VS: BP 124/68   Pulse 69   Temp 36.6 C   Resp 18   Ht 5\' 8"  (1.727 m)   Wt 161 lb (73 kg)   SpO2 100%   BMI 24.48 kg/m   PROVIDERS: Porfirio OarJeffery, Chelle, PA-C is PCP. [facility] Verdis PrimeSmith, Henry, MD is cardiologist  Delia HeadySethi, Pramod, MD is neurologist Leonides Sakehen, Brian, MD is vascular surgeon   LABS: Labs reviewed: Acceptable for surgery. (all labs ordered are listed, but only abnormal results are displayed)  Labs Reviewed  APTT - Abnormal; Notable for the following components:      Result Value   aPTT 39 (*)    All other components within normal limits  BRAIN NATRIURETIC PEPTIDE - Abnormal; Notable for the following components:   B Natriuretic Peptide 575.2 (*)    All other components within normal limits  CBC - Abnormal; Notable for the following components:   RBC 4.13 (*)    Hemoglobin 11.1 (*)    HCT 35.2 (*)    All other components within normal limits  COMPREHENSIVE METABOLIC PANEL - Abnormal; Notable for  the following components:   Glucose, Bld 100 (*)    Calcium 8.7 (*)    Total Protein 6.1 (*)    Albumin 3.1 (*)    ALT 11 (*)    All other components within normal limits  HEMOGLOBIN A1C - Abnormal; Notable for the following components:   Hgb A1c MFr Bld 4.7 (*)    All other components within normal limits  URINALYSIS, ROUTINE W REFLEX MICROSCOPIC - Abnormal; Notable for the following components:   Hgb urine dipstick SMALL (*)    All other components within normal limits  SURGICAL PCR SCREEN  BLOOD GAS, ARTERIAL  PROTIME-INR  TYPE AND SCREEN  ABO/RH    IMAGES: CXR 01/08/18: IMPRESSION: Anterior wedging of lower thoracic vertebral bodies is age indeterminate. Recommend clinical correlation. No other abnormalities.  CTA chest/abd/pelvis 11/23/17: IMPRESSION: 1. Vascular findings and measurements pertinent to potential TAVR procedure, as detailed above. 2. Severe thickening calcification of the aortic valve, compatible with the reported clinical history of severe aortic stenosis. 3. Aortic atherosclerosis, in addition to left main and 3 vessel coronary artery disease. 4. Trace left pleural effusion lying dependently. 5. 5 mm nonobstructive calculus in the lower pole collecting system of the right kidney. 6. Additional incidental findings, outlined in full report (see Results Review).  CTA head/neck 10/09/17: IMPRESSION: 1. 70-80% atheromatous narrowing at the right ICA bulb. 2. No other  significant stenosis in the neck. 3. Intracranial atherosclerosis without proximal or correctable stenosis. There is moderate atheromatous narrowing of the distal left P2 segment. 4. Presumed infarct in the right parietal lobe (see brain MRI report). Mild associated enhancement would correlate with subacute timing.   OTHER: PFTs 11/16/17: FVC 2.81 (89%), FEV1 1.91 (90%), DLCO unc 18.22 (61%).  EEG 10/26/17: Impression: This awake andasleepEEG is abnormal due to the presence of: 1.  Slowing of the posterior dominant rhythm 2. Additional focal slowing over the right posterior quadrant  EEG 10/09/17: Impression: This awake andasleepEEG is abnormal due to the presence of: 1. Milddiffuse slowing of the waking background 2. Additional focal slowing over the right posterior quadrant   EKG: 01/08/18 EKG: NSR, right BBB, T wave abnormality, consider inferior ischemia.   CV: Cardiac CT 11/23/17: IMPRESSION: 1. Calcified tri leaflet AV with annulus of 357 mm2 suitable for a 23 mm Sapien 3 valve 2.  Normal aortic root 3.4 cm 3.  Coronary Arteries sufficient height above annulus for deployment 4. Optimum angiographic angle for deployment LAO 21 degrees Caudal 4 degrees 5.  No LAA thrombus 6.  Patent stent in mid RCA  Cardiac cath 11/04/17:  Prox RCA-1 lesion is 30% stenosed.  Prox RCA-2 lesion is 95% stenosed.  Mid RCA to Dist RCA lesion is 20% stenosed.  Ost RPDA lesion is 30% stenosed.  Ost 3rd Mrg lesion is 50% stenosed.  Prox Cx to Mid Cx lesion is 30% stenosed.  Ost LAD to Prox LAD lesion is 70% stenosed.  Prox LAD-1 lesion is 70% stenosed.  Prox LAD-2 lesion is 70% stenosed.  Mid LAD lesion is 50% stenosed.  Mid LAD to Dist LAD lesion is 50% stenosed.  A drug-eluting stent was successfully placed using a STENT SYNERGY DES 2.75X20.  Post intervention, there is a 0% residual stenosis. 1. Triple vessel CAD 2. Moderately severe, calcified stenosis in the proximal and mid LAD 3. Moderate disease in the Circumflex and first OM branch 4. The RCA is large dominant vessel with severe, heavily calcified stenosis in the mid vessel.  5. Successful PTCA/DES in the mid RCA 6. Severe aortic stenosis (mean gradient 59.5 mmHg, peak gradient 71 mmHg, AVA 0.8 cm2) Recommendations: Will continue DAPT with ASA and Plavix. He is statin intolerant. Consider Praluent/Repatha. Will monitor overnight. Will continue planning for TAVR. Will plan medical therapy of his LAD.    Echo 10/10/17: Study Conclusions - Left ventricle: The cavity size was normal. Wall thickness was   increased in a pattern of moderate LVH. Systolic function was   normal. The estimated ejection fraction was in the range of 60%   to 65%. Wall motion was normal; there were no regional wall   motion abnormalities. Doppler parameters are consistent with   abnormal left ventricular relaxation (grade 1 diastolic   dysfunction). - Aortic valve: Valve mobility was moderately restricted.   Transvalvular velocity was increased. There was critical   stenosis. Valve area (VTI): 0.41 cm^2. Valve area (Vmax): 0.43   cm^2. Valve area (Vmean): 0.4 cm^2. - Mitral valve: Mildly calcified annulus.  Event monitor: 10/2017:  Sinus rhythm  Premature atrial contractions No evidence of atrial fibrillation or atrial flutter  Past Medical History:  Diagnosis Date  . Acute blood loss anemia   . Acute CVA (cerebrovascular accident) (HCC) 10/09/2017  . Anxiety   . Aortic stenosis, severe    for TAVR  . CAD (coronary artery disease)    a. s/p prox RCA DES x1 11/04/17  .  Cardiac murmur   . Chronic back pain   . Chronic lower back pain   . CVA (cerebral vascular accident) (HCC) 09/2017   "left sided weakness since" (11/04/2017)  . Depression   . ED (erectile dysfunction)   . Elevated troponin 10/09/2017  . Fall   . HTN (hypertension)   . Hyperlipidemia   . Leukocytosis   . Macular degeneration   . Myocardial infarction (HCC) 09/2017  . PTSD (post-traumatic stress disorder)   . PTSD (post-traumatic stress disorder)   . Seizure (HCC)   . 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 CATARACT EXTRACTION, BILATERAL Bilateral   . CORONARY STENT INTERVENTION N/A 11/04/2017   Procedure: CORONARY STENT INTERVENTION;  Surgeon: Kathleene Hazel, MD;  [facility]: [facility] INV[facility]IVE CV LAB;  Service: Cardiovascular;   Laterality: N/A;  . ELBOW SURGERY  1980s   "? side; hurt when I put it down; had to have nerves clipped"  . EYE SURGERY    . IR RADIOLOGY PERIPHERAL GUIDED IV START  11/23/2017  . IR US GUIDE VASC ACCESS RIGHT  11/23/2017  . RIGHT/LEFT HEART CATH AND CORONARY ANGIOGRAPHY N/A 11/04/2017   Procedure: RIGHT/LEFT HEART CATH AND CORONARY ANGIOGRAPHY;  Surgeon: Kathleene Hazel, MD;  Location: [facility] INVASIVE CV LAB;  Service: Cardiovascular;  Laterality: N/A;    MEDICATIONS: . acetaminophen (TYLENOL) 500 MG tablet  . aspirin 81 MG chewable tablet  . clopidogrel (PLAVIX) 75 MG tablet  . docusate sodium (COLACE) 100 MG capsule  . levETIRAcetam (KEPPRA) 500 MG tablet  . nitroGLYCERIN (NITROSTAT) 0.4 MG SL tablet  . polyethylene glycol (MIRALAX / GLYCOLAX) packet  . traZODone (DESYREL) 100 MG tablet   No current facility-administered medications for this encounter.     Velna Ochs [facility] Short Stay Center/Anesthesiology Phone [phone] 01/11/2018 10:19 AM

## 2018-01-11 NOTE — H&P (Addendum)
[address]       [place] [address]             [phone]      Cardiothoracic Surgery Admission History and Physical   Referring Provider is Chilton Si, MD  PCP is Porfirio Oar, PA-C      Chief Complaint  Patient presents with   Aortic Stenosis       HPI:  The patient is a 84 year old gentleman with hypertension, hyperlipidemia, PTSD, severe aortic stenosis, cerebrovascular disease status post right brain stroke on 10/09/2017, and coronary artery disease status post PCI of the RCA with a DES on 11/04/2017. He had known severe aortic stenosis with an echocardiogram on 04/12/2014 showing a mean gradient of 45 mmHg with a dimensionless index of 0.22. Left ventricular ejection fraction was 60 to 65%. He was apparently asymptomatic at that time and did not have a follow-up echocardiogram until he presented on 10/09/2017 with an acute right brain stroke with left hemiparesis. He said that he was watching television and started feeling numbness and weakness in his left arm and became confused. He stood up and tried to walk and fell to the floor. He said he may have lost consciousness briefly. He said that he could not move to get to a phone and was lying on the floor for 2 days before a family member found him. A CT of the brain showed a right parietal lobe infarct and CTA of the brain showed a 70 to 80% atheromatous narrowing at the right ICA bulb. Subsequent MR of the brain on 10/26/2017 showed an evolving subacute right parietal infarction. The patient was seen by vascular surgery and it was felt that he may need a right carotid endarterectomy at some point. A follow-up echocardiogram on 10/10/2017 showed a trileaflet aortic valve with moderate thickening and severe calcification with restricted leaflet mobility. The mean gradient was 71 mmHg with peak of 111 mmHg. Dimensionless index was 0.14 with a valve area of 0.43 cm. Left ventricular ejection fraction was 60 to 65%.  He was evaluated by Dr. Clifton James and underwent cardiac catheterization on 11/04/2017. This showed three-vessel coronary disease. The proximal to mid LAD had 70% stenosis. There is moderate disease in the left circumflex and first marginal branch. There is a 95% mid RCA stenosis that was treated with a drug-eluting stent. The mean gradient across aortic valve cath was 59.5 mmHg with a calculated aortic valve area of 0.8 cm. The plan was to maintain him on dual antiplatelet therapy and allow him to recover from his stroke prior to considering transcatheter aortic valve replacement. He has been taking Plavix but apparently has not been taking aspirin although it is listed on his medication sheet. He was discharged from the hospital to a skilled nursing rehab but is now at home. He is walking around his house without difficulty. He does use a cane or walker for stabilization. He denies any chest pain. He has had fatigue if he exerts himself and has noted decreased energy. He has had some dizziness. This is particularly with position changes. He denies any shortness of breath or lower extremity edema.   He is living at home independently but has family members who help him. He has been ambulatory. He was driving prior to his stroke but has not returned to driving. Prior to his stroke he had been very active.      Past Medical History:  Diagnosis Date   Acute blood  loss anemia    Acute CVA (cerebrovascular accident) (HCC) 10/09/2017   Anxiety    Aortic stenosis, severe    for TAVR   CAD (coronary artery disease)    a. 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 side; hurt when I put it down; had to have nerves clipped"   EYE SURGERY     IR RADIOLOGY PERIPHERAL GUIDED IV START  11/23/2017   IR US GUIDE VASC ACCESS RIGHT  11/23/2017   RIGHT/LEFT HEART CATH AND CORONARY ANGIOGRAPHY N/A 11/04/2017   Procedure: RIGHT/LEFT HEART CATH AND CORONARY ANGIOGRAPHY; Surgeon: Kathleene HazelMcAlhany, Christopher D, MD; Location: [facility]; Service: Cardiovascular; Laterality: N/A;        Family History  Problem Relation Age of Onset   Hypertension Other    Social History        Socioeconomic History   Marital status: Divorced    Spouse name: n/a   Number of children: 2   Years of education: Not on file   Highest education level: Not on file  Occupational History   Occupation: retired  Ecologistocial Needs   Financial resource strain: Not on file   Food insecurity:    Worry: Not on file    Inability: Not on Occupational hygienistfile   Transportation needs:    Medical: Not on file    Non-medical: Not on file  Tobacco Use   Smoking status: Former Smoker    Years: 2.00    Types: Cigarettes   Smokeless tobacco: Never Used   Tobacco comment: "none since 1980s" (11/04/2017)  Substance and Sexual Activity   Alcohol use: No    Comment: none since 1980   Drug use: Never   Sexual activity: Yes    Partners: Female  Lifestyle   Physical activity:    Days per week: Not on file    Minutes per session: Not on file   Stress: Not on file    Relationships   Social connections:    Talks on phone: Not on file    Gets together: Not on file    Attends religious service: Not on file    Active member of club or organization: Not on file    Attends meetings of clubs or organizations: Not on file    Relationship status: Not on file   Intimate partner violence:    Fear of current or ex partner: Not on file    Emotionally abused: Not on file    Physically abused: Not on file  Forced sexual activity: Not on file  Other Topics Concern   Not on file  Social History Narrative   Divorced in 11. 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         Current Outpatient Medications  Medication Sig Dispense Refill   aspirin 81 MG chewable tablet Chew 1 tablet (81 mg total) by mouth daily. 60 tablet 3   nitroGLYCERIN (NITROSTAT) 0.4 MG SL tablet Place 1 tablet (0.4 mg total) under the tongue every 5 (five) minutes as needed for chest pain. 25 tablet 0   clopidogrel (PLAVIX) 75 MG tablet Take 1 tablet (75 mg total) by mouth daily with breakfast. 90 tablet 3   docusate sodium (COLACE) 100 MG capsule Take 1 capsule (100 mg total) by mouth daily as needed for mild constipation. 90 capsule 3   levETIRAcetam (KEPPRA) 500 MG tablet Take 1 tablet (500 mg total) by mouth 2 (two) times daily. 180 tablet 3   traZODone (DESYREL) 100 MG tablet Take 1 tablet (100 mg total) by mouth at bedtime. 90 tablet 3   No current facility-administered medications for this visit.         Allergies  Allergen Reactions   Statins Other (See Comments)    MYALGIAS   Review of Systems:   General: normal appetite, decreased energy, no weight gain, no weight loss, no fever  Cardiac: no chest pain with exertion, no chest pain at rest, no SOB with exertion, no resting SOB, no PND, no orthopnea, no palpitations, no arrhythmia, no atrial fibrillation, no LE edema, occasional dizzy spells, no syncope  Respiratory: no shortness of breath, no home oxygen, no  productive cough, no dry cough, no bronchitis, no wheezing, no hemoptysis, no asthma, no pain with inspiration or cough, no sleep apnea, no CPAP at night  GI: no difficulty swallowing, no reflux, no frequent heartburn, no hiatal hernia, no abdominal pain, no constipation, no diarrhea, no hematochezia, no hematemesis, no melena  GU: no dysuria, no frequency, no urinary tract infection, no hematuria, no enlarged prostate, no kidney stones, no kidney disease  Vascular: no pain suggestive of claudication, no pain in feet, no leg cramps, no varicose veins, no DVT, no non-healing foot ulcer  Neuro: Recent stroke, no TIA's, no seizures, no headaches, no temporary blindness one eye, no slurred speech, no peripheral neuropathy, no chronic pain, some instability of gait, no memory/cognitive dysfunction  Musculoskeletal: no arthritis, no joint swelling, no myalgias, no difficulty walking, reduced mobility  Skin: no rash, no itching, no skin infections, no pressure sores or ulcerations  Psych: no anxiety, no depression, no nervousness, no unusual recent stress  Eyes: + blurry vision, + floaters, + recent vision changes, + wears glasses or contacts  ENT: no hearing loss, no loose or painful teeth, wears dentures  Hematologic: no easy bruising, no abnormal bleeding, no clotting disorder, no frequent epistaxis  Endocrine: no diabetes, does not check CBG's at home   Physical Exam:   BP 114/66 (BP Location: Right Arm, Patient Position: Sitting, Cuff Size: Normal)   Pulse 77   Ht 5\' 8"  (1.727 m)   Wt 174 lb (78.9 kg)   SpO2 98% Comment: RA   BMI 26.46 kg/m  General: Elderly, somewhat frail-appearing  HEENT: Unremarkable, NCAT, PERLA, EOMI, oropharynx clear  Neck: no JVD, no bruits, no adenopathy or thyromegaly  Chest: clear to auscultation, symmetrical breath sounds, no wheezes, no rhonchi  CV: RRR, grade III/VI crescendo/decrescendo murmur heard best at RSB, no diastolic murmur  Abdomen:  soft, non-tender, no  masses or organomegaly  Extremities: warm, well-perfused, pulses diminished in feet, no LE edema  Rectal/GU Deferred  Neuro: Strength in upper extremities seems symmetrical. He has mild LLE weakness and is dragging the left leg a little with ambulation.  Skin: Clean and dry, no rashes, no breakdown    Diagnostic Tests:    *[place]*  *[facility]*  [address]  [place], [place] [address]  [phone]  -------------------------------------------------------------------  Transthoracic Echocardiography  Patient: Cipriano, Millikan  MR #: [id number]  Study Date: 10/10/2017  Gender: M  Age: 29  Height: 172.7 cm  Weight: 81.6 kg  BSA: 2 m^2  Pt. Status:  Room: 3W05C  ADMITTING Ara Kussmaul  SONOGRAPHER [address]  ATTENDING [place] Fossa [id number]  PERFORMING [facility], Inpatient  cc:  -------------------------------------------------------------------  LV EF: 60% - 65%  -------------------------------------------------------------------  Indications: Syncope 780.2.  -------------------------------------------------------------------  History: PMH: Rhabdomyosis, Elevated Troponin. PTSD. Syncope.  Stroke. Risk factors: Dyslipidemia.  -------------------------------------------------------------------  Study Conclusions  - Left ventricle: The cavity size was normal. Wall thickness was  increased in a pattern of moderate LVH. Systolic function was  normal. The estimated ejection fraction was in the range of 60%  to 65%. Wall motion was normal; there were no regional wall  motion abnormalities. Doppler parameters are consistent with  abnormal left ventricular relaxation (grade 1 diastolic  dysfunction).  - Aortic valve: Valve mobility was moderately restricted.  Transvalvular velocity was increased. There was critical  stenosis. Valve area (VTI): 0.41 cm^2. Valve area (Vmax): 0.43    cm^2. Valve area (Vmean): 0.4 cm^2.  - Mitral valve: Mildly calcified annulus.  -------------------------------------------------------------------  Study data: Comparison was made to the study of 04/12/2014. Study  status: Routine. Procedure: Transthoracic echocardiography.  Image quality was adequate. Study completion: There were no  complications. Transthoracic echocardiography. M-mode,  complete 2D, spectral Doppler, and color Doppler. Birthdate:  Patient birthdate: 07-09-1924. Age: Patient is 84 yr old. Sex:  Gender: male. BMI: 27.4 kg/m^2. Blood pressure: 115/86  Patient status: Inpatient. Study date: Study date: 10/10/2017.  Study time: 02:43 PM. Location: Bedside.  -------------------------------------------------------------------  -------------------------------------------------------------------  Left ventricle: The cavity size was normal. Wall thickness was  increased in a pattern of moderate LVH. Systolic function was  normal. The estimated ejection fraction was in the range of 60% to  65%. Wall motion was normal; there were no regional wall motion  abnormalities. Doppler parameters are consistent with abnormal left  ventricular relaxation (grade 1 diastolic dysfunction).  -------------------------------------------------------------------  Aortic valve: Trileaflet; moderately thickened, severely  calcified leaflets. Valve mobility was moderately restricted.  Doppler: Transvalvular velocity was increased. There was critical  stenosis. There was no regurgitation. VTI ratio of LVOT to  aortic valve: 0.13. Valve area (VTI): 0.41 cm^2. Indexed valve area  (VTI): 0.21 cm^2/m^2. Peak velocity ratio of LVOT to aortic valve:  0.14. Valve area (Vmax): 0.43 cm^2. Indexed valve area (Vmax): 0.22  cm^2/m^2. Mean velocity ratio of LVOT to aortic valve: 0.13. Valve  area (Vmean): 0.4 cm^2. Indexed valve area (Vmean): 0.2 cm^2/m^2.  Mean gradient (S): 71 mm Hg. Peak gradient (S): 111  mm Hg.  -------------------------------------------------------------------  Aorta: Aortic root: The aortic root was normal in size.  -------------------------------------------------------------------  Mitral valve: Mildly calcified annulus. Mobility was not  restricted. Doppler: Transvalvular velocity was within the normal  range. There was no evidence for stenosis. There was no  regurgitation. Valve area by  pressure half-time: 3.24 cm^2.  Indexed valve area by pressure half-time: 1.62 cm^2/m^2. Peak  gradient (D): 2 mm Hg.  -------------------------------------------------------------------  Left atrium: The atrium was normal in size.  -------------------------------------------------------------------  Right ventricle: The cavity size was normal. Wall thickness was  normal. Systolic function was normal.  -------------------------------------------------------------------  Pulmonic valve: The valve appears to be grossly normal.  Doppler: Transvalvular velocity was within the normal range. There  was no evidence for stenosis.  -------------------------------------------------------------------  Tricuspid valve: Structurally normal valve. Doppler:  Transvalvular velocity was within the normal range. There was no  regurgitation.  -------------------------------------------------------------------  Right atrium: The atrium was normal in size.  -------------------------------------------------------------------  Pericardium: There was no pericardial effusion.  -------------------------------------------------------------------  Systemic veins:  Inferior vena cava: The vessel was normal in size.  -------------------------------------------------------------------  Measurements  Left ventricle Value Reference  LV ID, ED, PLAX chordal (L) 32 mm 43 - 52  LV ID, ES, PLAX chordal 23 mm 23 - 38  LV fx shortening, PLAX chordal (L) 28 % >=29  LV PW thickness, ED 14 mm ----------  IVS/LV PW  ratio, ED 1 <=1.3  Stroke volume, 2D 53 ml ----------  Stroke volume/bsa, 2D 27 ml/m^2 ----------  LV e&', lateral 7.18 cm/s ----------  LV E/e&', lateral 10.54 ----------  LV e&', medial 6.31 cm/s ----------  LV E/e&', medial 12 ----------  LV e&', average 6.75 cm/s ----------  LV E/e&', average 11.22 ----------  Ventricular septum Value Reference  IVS thickness, ED 14 mm ----------  LVOT Value Reference  LVOT ID, S 20 mm ----------  LVOT area 3.14 cm^2 ----------  LVOT peak velocity, S 71.7 cm/s ----------  LVOT mean velocity, S 50.5 cm/s ----------  LVOT VTI, S 16.8 cm ----------  Aortic valve Value Reference  Aortic valve peak velocity, S 527 cm/s ----------  Aortic valve mean velocity, S 400 cm/s ----------  Aortic valve VTI, S 130 cm ----------  Aortic mean gradient, S 71 mm Hg ----------  Aortic peak gradient, S 111 mm Hg ----------  VTI ratio, LVOT/AV 0.13 ----------  Aortic valve area, VTI 0.41 cm^2 ----------  Aortic valve area/bsa, VTI 0.21 cm^2/m^2 ----------  Velocity ratio, peak, LVOT/AV 0.14 ----------  Aortic valve area, peak velocity 0.43 cm^2 ----------  Aortic valve area/bsa, peak 0.22 cm^2/m^2 ----------  velocity  Velocity ratio, mean, LVOT/AV 0.13 ----------  Aortic valve area, mean velocity 0.4 cm^2 ----------  Aortic valve area/bsa, mean 0.2 cm^2/m^2 ----------  velocity  Aorta Value Reference  Aortic root ID, ED 27 mm ----------  Left atrium Value Reference  LA ID, A-P, ES 41 mm ----------  LA ID/bsa, A-P 2.05 cm/m^2 <=2.2  LA volume, S 52.7 ml ----------  LA volume/bsa, S 26.4 ml/m^2 ----------  LA volume, ES, 1-p A4C 44.3 ml ----------  LA volume/bsa, ES, 1-p A4C 22.2 ml/m^2 ----------  LA volume, ES, 1-p A2C 57.9 ml ----------  LA volume/bsa, ES, 1-p A2C 29 ml/m^2 ----------  Mitral valve Value Reference  Mitral E-wave peak velocity 75.7 cm/s ----------  Mitral A-wave peak velocity 104 cm/s ----------  Mitral deceleration time (H) 232 ms 150  - 230  Mitral pressure half-time 68 ms ----------  Mitral peak gradient, D 2 mm Hg ----------  Mitral E/A ratio, peak 0.7 ----------  Mitral valve area, PHT, DP 3.24 cm^2 ----------  Mitral valve area/bsa, PHT, DP 1.62 cm^2/m^2 ----------  Right atrium Value Reference  RA ID, S-I, ES, A4C 48.7 mm 34 - 49  RA area, ES, A4C 16 cm^2 8.3 -  19.5  RA volume, ES, A/L 45.3 ml ----------  RA volume/bsa, ES, A/L 22.7 ml/m^2 ----------  Systemic veins Value Reference  Estimated CVP 3 mm Hg ----------  Right ventricle Value Reference  TAPSE 27 mm ----------  RV s&', lateral, S 18 cm/s ----------  Legend:  (L) and (H) mark values outside specified reference range.  -------------------------------------------------------------------  Prepared and Electronically Authenticated by  Georga Hacking, MD [facility]  2019-03-02T16:30:10    Physicians  Panel Physicians Referring Physician Case Authorizing Physician  Kathleene Hazel, MD (Primary)    Procedures  CORONARY STENT INTERVENTION  RIGHT/LEFT HEART CATH AND CORONARY ANGIOGRAPHY  Conclusion  Prox RCA-1 lesion is 30% stenosed.  Prox RCA-2 lesion is 95% stenosed.  Mid RCA to Dist RCA lesion is 20% stenosed.  Ost RPDA lesion is 30% stenosed.  Ost 3rd Mrg lesion is 50% stenosed.  Prox Cx to Mid Cx lesion is 30% stenosed.  Ost LAD to Prox LAD lesion is 70% stenosed.  Prox LAD-1 lesion is 70% stenosed.  Prox LAD-2 lesion is 70% stenosed.  Mid LAD lesion is 50% stenosed.  Mid LAD to Dist LAD lesion is 50% stenosed.  A drug-eluting stent was successfully placed using a STENT SYNERGY DES 2.75X20.  Post intervention, there is a 0% residual stenosis. 1. Triple vessel CAD  2. Moderately severe, calcified stenosis in the proximal and mid LAD  3. Moderate disease in the Circumflex and first OM branch  4. The RCA is large dominant vessel with severe, heavily calcified stenosis in the mid vessel.  5. Successful PTCA/DES in the mid RCA  6. Severe  aortic stenosis (mean gradient 59.5 mmHg, peak gradient 71 mmHg, AVA 0.8 cm2)  Recommendations: Will continue DAPT with ASA and Plavix. He is statin intolerant. Consider Praluent/Repatha. Will monitor overnight. Will continue planning for TAVR. Will plan medical therapy of his LAD.   Indications  Coronary artery disease involving native coronary artery of native heart with unstable angina pectoris (HCC) [I25.110 (ICD-10-CM)]  Severe aortic stenosis [I35.0 (ICD-10-CM)]  Procedural Details/Technique  Technical Details Indication: 84 yo male with h/o severe AS, severe carotid artery disease with recent CVA here today for cath as part of TAVR workup. Recent worsened dyspnea and chest pain.   Procedure: The risks, benefits, complications, treatment options, and expected outcomes were discussed with the patient. The patient and/or family concurred with the proposed plan, giving informed consent. The patient was brought to the cath lab after IV hydration was given. The patient was not sedated. The right groin was prepped and draped. I placed a 7 French sheath in the right femoral vein under u/s guidance. Right heart cath performed with a balloon tipped catheter. The right wrist was prepped and draped in a sterile fashion. 1% lidocaine was used for local anesthesia. Using the modified Seldinger access technique, a 5 French sheath was placed in the right radial artery. 3 mg Verapamil was given through the sheath. 4000 units IV heparin was given. Standard diagnostic catheters were used to perform selective coronary angiography. I crossed the aortic valve with an AL-2 catheter and a straight wire. LV pressures measured.   PCI Note: IV heparin used for anti-coagulation. ACT over 300. Plavix 600 mg po x 1. I engaged the RCA with an AL-1 guiding catheter and a Cougar IC wire was advanced down the RCA. I then used a 2.5 x 12 mm balloon to pre-dilate the stenosis in the mid RCA. I then deployed a 2.75 x 20 mm Synergy DES  in the mid RCA. The stent was post-dilated with a 3.0 x 12 mm [place] balloon x 2. The stenosis was taken from 95% down to 0%. TIMI 3 flow pre and post stent.   The sheath was removed from the right radial artery and a Zephyr hemostasis band was applied at the arteriotomy site on the right wrist.     Estimated blood loss <50 mL.  During this procedure no sedation was administered.  Complications  Complications documented before study signed (11/04/2017 10:19 AM EDT)     Log Level Complications   None Documented by Kathleene Hazel, MD 11/04/2017 10:13 AM EDT  Time Range: Intraprocedure  Coronary Findings  Diagnostic  Dominance: Right  Left Anterior Descending  Vessel is large.  Ost LAD to Prox LAD lesion 70% stenosed  Ost LAD to Prox LAD lesion is 70% stenosed. The lesion is calcified.  Prox LAD-1 lesion 70% stenosed  Prox LAD-1 lesion is 70% stenosed.  Prox LAD-2 lesion 70% stenosed  Prox LAD-2 lesion is 70% stenosed.  Mid LAD lesion 50% stenosed  Mid LAD lesion is 50% stenosed.  Mid LAD to Dist LAD lesion 50% stenosed  Mid LAD to Dist LAD lesion is 50% stenosed.  Left Circumflex  Prox Cx to Mid Cx lesion 30% stenosed  Prox Cx to Mid Cx lesion is 30% stenosed.  Third Obtuse Marginal Branch  Ost 3rd Mrg lesion 50% stenosed  Ost 3rd Mrg lesion is 50% stenosed.  Right Coronary Artery  Vessel is large.  Prox RCA-1 lesion 30% stenosed  Prox RCA-1 lesion is 30% stenosed. The lesion is calcified.  Prox RCA-2 lesion 95% stenosed  Prox RCA-2 lesion is 95% stenosed. The lesion is calcified.  Mid RCA to Dist RCA lesion 20% stenosed  Mid RCA to Dist RCA lesion is 20% stenosed.  Right Posterior Descending Artery  Ost RPDA lesion 30% stenosed  Ost RPDA lesion is 30% stenosed.  Intervention  Prox RCA-2 lesion  Stent  Pre-stent angioplasty was performed using a BALLOON SAPPHIRE 2.5X12. A drug-eluting stent was successfully placed using a STENT SYNERGY DES 2.75X20. Post-stent  angioplasty was performed using a BALLOON SAPPHIRE Brandt 3.0X12.  Post-Intervention Lesion Assessment  The intervention was successful. Pre-interventional TIMI flow is 3. Post-intervention TIMI flow is 3. No complications occurred at this lesion.  There is no residual stenosis post intervention.  Right Heart  Right Heart Pressures Elevated LV EDP consistent with volume overload.  Coronary Diagrams  Diagnostic Diagram     Post-Intervention Diagram     Implants     Permanent Stent  Stent Synergy Des 2.75x20 - [id number] - Implanted   Inventory item: Marella Bile DES 2.75X20 Model/Cat number: [id number]  Manufacturer: [employer] Lot number: [id number]  Device identifier: [id number] Device identifier type: GS1  Area Of Implantation: Mid RCA    GUDID Information   Request status Successful    Brand name: SYNERGY Version/Model: [id number]  Company name: [employer] MRI safety info as of 11/04/17: MR Conditional  Contains dry or latex rubber: No    GMDN P.T. name: Drug-eluting coronary artery stent, bioabsorbable-polymer-coated    As of 11/04/2017    Status: Implanted     MERGE Images   Link to Procedure Log    Show images for CARDIAC CATHETERIZATION  Procedure Log   Hemo Data   Most Recent Value  Fick Cardiac Output 6.07 L/min  Fick Cardiac Output Index 3.18 (L/min)/BSA  Aortic Mean Gradient 59.5 mmHg  Aortic Peak  Gradient 71 mmHg  Aortic Valve Area 0.80  Aortic Value Area Index 0.42 cm2/BSA  RA A Wave 8 mmHg  RA V Wave 6 mmHg  RA Mean 6 mmHg  RV Systolic Pressure 40 mmHg  RV Diastolic Pressure 8 mmHg  RV EDP 9 mmHg  PA Systolic Pressure 36 mmHg  PA Diastolic Pressure 17 mmHg  PA Mean 25 mmHg  PW A Wave 19 mmHg  PW V Wave 25 mmHg  PW Mean 17 mmHg  AO Systolic Pressure 154 mmHg  AO Diastolic Pressure 62 mmHg  AO Mean 96 mmHg  LV Systolic Pressure 230 mmHg  LV Diastolic Pressure 12 mmHg  LV EDP 26 mmHg  Arterial Occlusion  Pressure Extended Systolic Pressure 168 mmHg  Arterial Occlusion Pressure Extended Diastolic Pressure 62 mmHg  Arterial Occlusion Pressure Extended Mean Pressure 103 mmHg  Left Ventricular Apex Extended Systolic Pressure 239 mmHg  Left Ventricular Apex Extended Diastolic Pressure 11 mmHg  Left Ventricular Apex Extended EDP Pressure 26 mmHg  QP/QS 1  TPVR Index 7.86 HRUI  TSVR Index 30.19 HRUI  PVR SVR Ratio 0.09  TPVR/TSVR Ratio 0.26    ADDENDUM REPORT: 11/23/2017 15:23  CLINICAL DATA: Aortic stenosis  EXAM:  Cardiac TAVR CT  TECHNIQUE:  The patient was scanned on a Siemens Force 192 slice scanner. A 120  kV retrospective scan was triggered in the descending thoracic aorta  at 111 HU's. Gantry rotation speed was 270 msecs and collimation was  .9 mm. No beta blockade or nitro were given. The 3D data set was  reconstructed in 5% intervals of the R-R cycle. Systolic and  diastolic phases were analyzed on a dedicated work station using  MPR, MIP and VRT modes. The patient received 80 cc of contrast.  FINDINGS:  Aortic Valve: Tri-leaflet and severely calcified with restricted  motion  Aorta: No aneurysm with moderate calcific atherosclerosis normal  arch vessels and horizontal annulus  Sinotubular Junction: 23 mm  Ascending Thoracic Aorta: 34 mm  Aortic Arch: 27 mm  Descending Thoracic Aorta: 25 mm  Sinus of Valsalva Measurements:  Non-coronary: 30 mm  Right - coronary: 27.5 mm  Left - coronary: 28.9 mm  Coronary Artery Height above Annulus:  Left Main: 12 mm above annulus  Right Coronary: 12.7 mm above annulus  Virtual Basal Annulus Measurements:  Maximum/Minimum Diameter: Circular annulus 21.6 mm x 20.9 mm  Perimeter: 70.4 mm  Area: 357 mm2  Coronary Arteries: Sufficient height above annulus for deployment  Optimum Fluoroscopic Angle for Delivery: LAO 21 degrees Caudal 4  degrees  IMPRESSION:  1. Calcified tri leaflet AV with annulus of 357 mm2 suitable for a  23 mm  Sapien 3 valve  2. Normal aortic root 3.4 cm  3. Coronary Arteries sufficient height above annulus for deployment  4. Optimum angiographic angle for deployment LAO 21 degrees Caudal 4  degrees  5. No LAA thrombus  6. Patent stent in mid RCA  Charlton Haws  Electronically Signed  By: Charlton Haws M.D.  On: 11/23/2017 15:23    CLINICAL DATA: 84 year old male with history of severe aortic  stenosis. Preprocedural study prior to potential transcatheter  aortic valve replacement (TAVR) procedure.  EXAM:  CT ANGIOGRAPHY CHEST, ABDOMEN AND PELVIS  TECHNIQUE:  Multidetector CT imaging through the chest, abdomen and pelvis was  performed using the standard protocol during bolus administration of  intravenous contrast. Multiplanar reconstructed images and MIPs were  obtained and reviewed to evaluate the vascular anatomy.  CONTRAST: ISOVUE-370 IOPAMIDOL (  ISOVUE-370) INJECTION 76%  COMPARISON: None.  FINDINGS:  CTA CHEST FINDINGS  Cardiovascular: Heart size is mildly enlarged. There is no  significant pericardial fluid, thickening or pericardial  calcification. There is aortic atherosclerosis, as well as  atherosclerosis of the great vessels of the mediastinum and the  coronary arteries, including calcified atherosclerotic plaque in the  left main, left anterior descending, left circumflex and right  coronary arteries. Severe thickening calcification of the aortic  valve. Mild calcifications of the mitral valve and subvalvular  apparatus.  Mediastinum/Lymph Nodes: No pathologically enlarged mediastinal or  hilar lymph nodes. Esophagus is unremarkable in appearance. No  axillary lymphadenopathy.  Lungs/Pleura: Trace left pleural effusion lying dependently. Areas  of scarring and/or subsegmental atelectasis in the left lung base.  No acute consolidative airspace disease. No pleural effusions. No  suspicious appearing pulmonary nodules or masses.  Musculoskeletal/Soft Tissues: There  are no aggressive appearing  lytic or blastic lesions noted in the visualized portions of the  skeleton.  CTA ABDOMEN AND PELVIS FINDINGS  Hepatobiliary: Multiple small low-attenuation lesions throughout the  liver, largest of which measures 1.4 cm in segment 7 (axial image 96  of series 15), compatible with a simple cyst. The smaller lesions  are too small to definitively characterize, but are statistically  likely tiny cysts or biliary hamartomas. No intra or extrahepatic  biliary ductal dilatation. Gallbladder is normal in appearance.  Pancreas: No pancreatic mass. No pancreatic ductal dilatation. No  pancreatic or peripancreatic fluid or inflammatory changes.  Spleen: Unremarkable.  Adrenals/Urinary Tract: 5 mm nonobstructive calculus in the lower  pole collecting system of the right kidney. Several low-attenuation  lesions in both kidneys, most of which are too small to  characterize, but favored to represent tiny cysts. The largest of  these is an exophytic simple cyst in the upper pole of the left  kidney measuring 1.6 cm in diameter. No definite suspicious renal  lesions. Bilateral adrenal glands are normal in appearance. No  hydroureteronephrosis. Diverticulum measuring 2.9 cm from the  superior aspect of the urinary bladder incidentally noted.  Stomach/Bowel: Normal appearance of the stomach. No pathologic  dilatation of small bowel or colon. Normal appendix.  Vascular/Lymphatic: Aortic atherosclerosis, without evidence of  dissection in the abdominal or pelvic vasculature. Vascular findings  and measurements pertinent to potential TAVR procedure, as detailed  below. No lymphadenopathy noted in the abdomen or pelvis.  Reproductive: Prostate gland is enlarged and heterogeneous in  appearance with severe median lobe hypertrophy measuring  approximately 4.5 x 5.5 x 8.5 cm. Seminal vesicles are unremarkable  in appearance.  Other: No significant volume of ascites. No  pneumoperitoneum.  Musculoskeletal: Levoscoliosis of the lumbar spine with multilevel  degenerative disc disease and facet arthropathy. There are no  aggressive appearing lytic or blastic lesions noted in the  visualized portions of the skeleton.  VASCULAR MEASUREMENTS PERTINENT TO TAVR:  AORTA:  Minimal Aortic Diameter-10 x 13 mm  Severity of Aortic Calcification-severe  RIGHT PELVIS:  Right Common Iliac Artery -  Minimal Diameter-6.0 x 5.4 mm  Tortuosity-mild  Calcification-severe  Right External Iliac Artery -  Minimal Diameter-6.9 x 4.3 mm  Tortuosity-moderate  Calcification-moderate  Right Common Femoral Artery -  Minimal Diameter-7.4 x 6.5 mm  Tortuosity-mild  Calcification-severe severe  LEFT PELVIS:  Left Common Iliac Artery -  Minimal Diameter-6.4 x 2.9 mm  Tortuosity-mild  Calcification -  Left External Iliac Artery -  Minimal Diameter-4.7 x 3.0 mm  Tortuosity-mild  Calcification-moderate  Left Common Femoral  Artery -  Minimal Diameter-4.7 x 4.5 mm  Tortuosity-mild  Calcification-severe  Review of the MIP images confirms the above findings.  IMPRESSION:  1. Vascular findings and measurements pertinent to potential TAVR  procedure, as detailed above.  2. Severe thickening calcification of the aortic valve, compatible  with the reported clinical history of severe aortic stenosis.  3. Aortic atherosclerosis, in addition to left main and 3 vessel  coronary artery disease.  4. Trace left pleural effusion lying dependently.  5. 5 mm nonobstructive calculus in the lower pole collecting system  of the right kidney.  6. Additional incidental findings, as above.  Aortic Atherosclerosis (ICD10-I70.0).  Electronically Signed  By: Trudie Reed M.D.  On: 11/23/2017 15:30    [facility] Adult Cardiac Surgery Database Version 2.9 RISK SCORES Procedure: AVR + CAB CALCULATE  Risk of Mortality:  5.704%   Renal Failure:  6.140%   Permanent Stroke:  5.337%   Prolonged  Ventilation:  19.352%   DSW Infection:  0.322%   Reoperation:  9.159%   Morbidity or Mortality:  30.474%   Short Length of Stay:  5.735%   Long Length of Stay:  24.451%   Impression:   This 84 year old gentleman has stage D, critical, symptomatic aortic stenosis with New York Heart Association class II symptoms of exertional fatigue and tiredness consistent with chronic diastolic congestive heart failure. He presented with an acute ischemic right brain stroke on 10/09/2017 which is likely due to a combination of a 70 to 80% right carotid bulb stenosis in addition to critical aortic stenosis. He has made a good recovery from that and is now home and independent. In addition he also has three-vessel coronary disease and underwent PCI stenting of a high-grade mid right coronary stenosis on 11/04/2017. He denies any anginal symptoms at this time. He denies any exertional shortness of breath but is not that active at this time.  I have personally reviewed his echocardiogram, cardiac catheterization, and CTA studies. His echocardiogram shows a trileaflet aortic valve with moderate thickening and severely calcified leaflets with restricted mobility. The mean gradient is 71 mmHg consistent with critical aortic stenosis. Left ventricular systolic function is normal with grade 1 diastolic dysfunction. Cardiac catheterization showed three-vessel coronary disease with a high-grade right coronary stenosis that was treated successfully with a drug-eluting stent. I agree that aortic valve replacement is indicated in this patient with critical aortic stenosis. Although he is 84 years old he is still independent at home after recovering from the recent stroke and prior to that was very active. I think he would be at moderate to high risk for open surgical aortic valve replacement given his age and recent stroke. I think transcatheter aortic valve replacement would be a reasonable alternative for him with much less risk and a  much quicker recovery. His gated cardiac CTA shows anatomy suitable for transcatheter aortic valve replacement without any significant complicating features. His abdominal and pelvic CTA shows diffuse calcific atherosclerosis involving the abdominal aorta and iliofemoral vessels. There is a high-grade stenosis of the left common iliac artery which is probably large enough for diagnostic catheter but not for valve insertion. The right iliofemoral system looks like it may be suitable for transcatheter valve replacement using a 14 French sheath. The left axillary artery also looks like it would be suitable if needed.   The patient and his nephew were counseled at length regarding treatment alternatives for management of severe symptomatic aortic stenosis. The risks and benefits of surgical intervention have been  discussed in detail. Long-term prognosis with medical therapy was discussed. Alternative approaches such as conventional surgical aortic valve replacement, transcatheter aortic valve replacement, and palliative medical therapy were compared and contrasted at length. This discussion was placed in the context of the patient's own specific clinical presentation and past medical history. All of their questions been addressed. The patient is eager to proceed with surgical management as soon as possible.   The patient has been advised of a variety of complications that might develop including but not limited to risks of death, stroke, paravalvular leak, aortic dissection or other major vascular complications, aortic annulus rupture, device embolization, cardiac rupture or perforation, mitral regurgitation, acute myocardial infarction, arrhythmia, heart block or bradycardia requiring permanent pacemaker placement, congestive heart failure, respiratory failure, renal failure, pneumonia, infection, other late complications related to structural valve deterioration or migration, or other complications that might  ultimately cause a temporary or permanent loss of functional independence or other long term morbidity. The patient provides full informed consent for the procedure as described and all questions were answered.    Plan:   Right Transfemoral TAVR  Alleen Borne, MD

## 2018-01-11 NOTE — Anesthesia Preprocedure Evaluation (Addendum)
Anesthesia Evaluation  Patient identified by MRN, date of birth, ID band Patient awake    Reviewed: Allergy & Precautions, NPO status , Patient's Chart, lab work & pertinent test results  Airway Mallampati: II  TM Distance: >3 FB Neck ROM: Full    Dental  (+) Teeth Intact, Dental Advisory Given   Pulmonary former smoker,    Pulmonary exam normal breath sounds clear to auscultation       Cardiovascular hypertension, + angina + CAD, + Past MI, + Cardiac Stents (RCA DES) and + Peripheral Vascular Disease  Normal cardiovascular exam+ Valvular Problems/Murmurs AS  Rhythm:Regular Rate:Normal  Echo 10/10/17: Study Conclusions - Left ventricle: The cavity size was normal. Wall thickness wasincreased in a pattern of moderate LVH. Systolic function wasnormal. The estimated ejection fraction was in the range of 60%to 65%. Wall motion was normal; there were no regional wallmotion abnormalities. Doppler parameters are consistent withabnormal left ventricular relaxation (grade 1 diastolicdysfunction). - Aortic valve: Valve mobility was moderately restricted. Transvalvular velocity was increased. There was critical stenosis. Valve area (VTI): 0.41 cm^2. Valve area (Vmax): 0.43cm^2. Valve area (Vmean): 0.4 cm^2. - Mitral valve: Mildly calcified annulus.     Neuro/Psych Seizures -,  CVA, Residual Symptoms    GI/Hepatic negative GI ROS, Neg liver ROS,   Endo/Other  negative endocrine ROS  Renal/GU negative Renal ROS     Musculoskeletal negative musculoskeletal ROS (+)   Abdominal   Peds  Hematology  (+) Blood dyscrasia (PLavix), anemia ,   Anesthesia Other Findings Day of surgery medications reviewed with the patient.  Reproductive/Obstetrics                            Anesthesia Physical Anesthesia Plan  ASA: IV  Anesthesia Plan: MAC   Post-op Pain Management:    Induction:  Intravenous  PONV Risk Score and Plan: 1 and Propofol infusion  Airway Management Planned: Natural Airway and Simple Face Mask  Additional Equipment: Arterial line, CVP and Ultrasound Guidance Line Placement  Intra-op Plan:   Post-operative Plan:   Informed Consent: I have reviewed the patients History and Physical, chart, labs and discussed the procedure including the risks, benefits and alternatives for the proposed anesthesia with the patient or authorized representative who has indicated his/her understanding and acceptance.   Dental advisory given  Plan Discussed with: CRNA  Anesthesia Plan Comments: (Arterial line, Double Lumen CVC, MAC if appropriate per surgical plan.)        Anesthesia Quick Evaluation

## 2018-01-12 ENCOUNTER — Inpatient Hospital Stay (HOSPITAL_COMMUNITY): Payer: PPO | Admitting: Emergency Medicine

## 2018-01-12 ENCOUNTER — Encounter (HOSPITAL_COMMUNITY): Admission: RE | Disposition: E | Payer: Self-pay | Source: Ambulatory Visit | Attending: Surgery

## 2018-01-12 ENCOUNTER — Inpatient Hospital Stay (HOSPITAL_COMMUNITY): Payer: PPO | Admitting: Certified Registered Nurse Anesthetist

## 2018-01-12 ENCOUNTER — Inpatient Hospital Stay (HOSPITAL_COMMUNITY)
Admission: RE | Admit: 2018-01-12 | Discharge: 2018-02-08 | DRG: 266 | Disposition: E | Payer: PPO | Source: Ambulatory Visit | Attending: Surgery | Admitting: Surgery

## 2018-01-12 ENCOUNTER — Encounter (HOSPITAL_COMMUNITY): Payer: Self-pay | Admitting: Physician Assistant

## 2018-01-12 ENCOUNTER — Inpatient Hospital Stay (HOSPITAL_COMMUNITY): Payer: PPO

## 2018-01-12 DIAGNOSIS — I340 Nonrheumatic mitral (valve) insufficiency: Secondary | ICD-10-CM | POA: Diagnosis not present

## 2018-01-12 DIAGNOSIS — I2510 Atherosclerotic heart disease of native coronary artery without angina pectoris: Secondary | ICD-10-CM | POA: Diagnosis present

## 2018-01-12 DIAGNOSIS — I4510 Unspecified right bundle-branch block: Secondary | ICD-10-CM | POA: Diagnosis present

## 2018-01-12 DIAGNOSIS — E785 Hyperlipidemia, unspecified: Secondary | ICD-10-CM | POA: Diagnosis not present

## 2018-01-12 DIAGNOSIS — Z7902 Long term (current) use of antithrombotics/antiplatelets: Secondary | ICD-10-CM | POA: Diagnosis not present

## 2018-01-12 DIAGNOSIS — G40909 Epilepsy, unspecified, not intractable, without status epilepticus: Secondary | ICD-10-CM | POA: Diagnosis present

## 2018-01-12 DIAGNOSIS — K5900 Constipation, unspecified: Secondary | ICD-10-CM | POA: Diagnosis not present

## 2018-01-12 DIAGNOSIS — I700 Atherosclerosis of aorta: Secondary | ICD-10-CM | POA: Diagnosis not present

## 2018-01-12 DIAGNOSIS — I69354 Hemiplegia and hemiparesis following cerebral infarction affecting left non-dominant side: Secondary | ICD-10-CM

## 2018-01-12 DIAGNOSIS — I4901 Ventricular fibrillation: Secondary | ICD-10-CM | POA: Diagnosis not present

## 2018-01-12 DIAGNOSIS — Z87891 Personal history of nicotine dependence: Secondary | ICD-10-CM

## 2018-01-12 DIAGNOSIS — I110 Hypertensive heart disease with heart failure: Secondary | ICD-10-CM | POA: Diagnosis not present

## 2018-01-12 DIAGNOSIS — Z8249 Family history of ischemic heart disease and other diseases of the circulatory system: Secondary | ICD-10-CM

## 2018-01-12 DIAGNOSIS — Z955 Presence of coronary angioplasty implant and graft: Secondary | ICD-10-CM

## 2018-01-12 DIAGNOSIS — I442 Atrioventricular block, complete: Secondary | ICD-10-CM | POA: Diagnosis not present

## 2018-01-12 DIAGNOSIS — F4310 Post-traumatic stress disorder, unspecified: Secondary | ICD-10-CM | POA: Diagnosis not present

## 2018-01-12 DIAGNOSIS — Z952 Presence of prosthetic heart valve: Secondary | ICD-10-CM | POA: Diagnosis not present

## 2018-01-12 DIAGNOSIS — I959 Hypotension, unspecified: Secondary | ICD-10-CM | POA: Diagnosis not present

## 2018-01-12 DIAGNOSIS — H3530 Unspecified macular degeneration: Secondary | ICD-10-CM | POA: Diagnosis present

## 2018-01-12 DIAGNOSIS — I350 Nonrheumatic aortic (valve) stenosis: Secondary | ICD-10-CM | POA: Diagnosis not present

## 2018-01-12 DIAGNOSIS — Z006 Encounter for examination for normal comparison and control in clinical research program: Secondary | ICD-10-CM

## 2018-01-12 DIAGNOSIS — Z7982 Long term (current) use of aspirin: Secondary | ICD-10-CM | POA: Diagnosis not present

## 2018-01-12 DIAGNOSIS — I469 Cardiac arrest, cause unspecified: Secondary | ICD-10-CM | POA: Diagnosis not present

## 2018-01-12 DIAGNOSIS — I10 Essential (primary) hypertension: Secondary | ICD-10-CM | POA: Diagnosis present

## 2018-01-12 DIAGNOSIS — I5033 Acute on chronic diastolic (congestive) heart failure: Secondary | ICD-10-CM

## 2018-01-12 HISTORY — PX: TEMPORARY PACEMAKER: CATH118268

## 2018-01-12 HISTORY — PX: TRANSCATHETER AORTIC VALVE REPLACEMENT, TRANSFEMORAL: SHX6400

## 2018-01-12 HISTORY — PX: INTRAOPERATIVE TRANSTHORACIC ECHOCARDIOGRAM: SHX6523

## 2018-01-12 LAB — POCT I-STAT 3, ART BLOOD GAS (G3+)
Bicarbonate: 25.9 mmol/L (ref 20.0–28.0)
O2 SAT: 100 %
PCO2 ART: 44.1 mmHg (ref 32.0–48.0)
TCO2: 27 mmol/L (ref 22–32)
pH, Arterial: 7.376 (ref 7.350–7.450)
pO2, Arterial: 240 mmHg — ABNORMAL HIGH (ref 83.0–108.0)

## 2018-01-12 LAB — POCT I-STAT, CHEM 8
BUN: 11 mg/dL (ref 6–20)
BUN: 10 mg/dL (ref 6–20)
BUN: 10 mg/dL (ref 6–20)
CALCIUM ION: 1.23 mmol/L (ref 1.15–1.40)
CALCIUM ION: 1.21 mmol/L (ref 1.15–1.40)
CHLORIDE: 105 mmol/L (ref 101–111)
CREATININE: 0.9 mg/dL (ref 0.61–1.24)
CREATININE: 0.8 mg/dL (ref 0.61–1.24)
CREATININE: 0.8 mg/dL (ref 0.61–1.24)
Calcium, Ion: 1.24 mmol/L (ref 1.15–1.40)
Chloride: 102 mmol/L (ref 101–111)
Chloride: 104 mmol/L (ref 101–111)
GLUCOSE: 122 mg/dL — AB (ref 65–99)
Glucose, Bld: 93 mg/dL (ref 65–99)
Glucose, Bld: 106 mg/dL — ABNORMAL HIGH (ref 65–99)
HCT: 32 % — ABNORMAL LOW (ref 39.0–52.0)
HCT: 29 % — ABNORMAL LOW (ref 39.0–52.0)
HEMATOCRIT: 28 % — AB (ref 39.0–52.0)
HEMOGLOBIN: 9.5 g/dL — AB (ref 13.0–17.0)
HEMOGLOBIN: 9.9 g/dL — AB (ref 13.0–17.0)
Hemoglobin: 10.9 g/dL — ABNORMAL LOW (ref 13.0–17.0)
Potassium: 3.8 mmol/L (ref 3.5–5.1)
Potassium: 3.5 mmol/L (ref 3.5–5.1)
Potassium: 3.7 mmol/L (ref 3.5–5.1)
SODIUM: 141 mmol/L (ref 135–145)
Sodium: 140 mmol/L (ref 135–145)
Sodium: 140 mmol/L (ref 135–145)
TCO2: 28 mmol/L (ref 22–32)
TCO2: 26 mmol/L (ref 22–32)
TCO2: 26 mmol/L (ref 22–32)

## 2018-01-12 LAB — POCT I-STAT 4, (NA,K, GLUC, HGB,HCT)
GLUCOSE: 120 mg/dL — AB (ref 65–99)
HCT: 28 % — ABNORMAL LOW (ref 39.0–52.0)
Hemoglobin: 9.5 g/dL — ABNORMAL LOW (ref 13.0–17.0)
Potassium: 3.5 mmol/L (ref 3.5–5.1)
Sodium: 141 mmol/L (ref 135–145)

## 2018-01-12 LAB — CBC
HEMATOCRIT: 30.8 % — AB (ref 39.0–52.0)
Hemoglobin: 9.6 g/dL — ABNORMAL LOW (ref 13.0–17.0)
MCH: 27 pg (ref 26.0–34.0)
MCHC: 31.2 g/dL (ref 30.0–36.0)
MCV: 86.5 fL (ref 78.0–100.0)
PLATELETS: 159 10*3/uL (ref 150–400)
RBC: 3.56 MIL/uL — AB (ref 4.22–5.81)
RDW: 14.1 % (ref 11.5–15.5)
WBC: 4 10*3/uL (ref 4.0–10.5)

## 2018-01-12 LAB — PROTIME-INR
INR: 1.16
Prothrombin Time: 14.7 seconds (ref 11.4–15.2)

## 2018-01-12 SURGERY — TEMPORARY PACEMAKER
Anesthesia: LOCAL

## 2018-01-12 SURGERY — IMPLANTATION, AORTIC VALVE, TRANSCATHETER, FEMORAL APPROACH
Anesthesia: Monitor Anesthesia Care | Site: Chest

## 2018-01-12 MED ORDER — LACTATED RINGERS IV SOLN: 500.0000 mL | Freq: Once | INTRAVENOUS | Status: DC | PRN

## 2018-01-12 MED ORDER — LACTATED RINGERS IV SOLN
INTRAVENOUS | Status: DC | PRN
  Administered 2018-01-12: 08:00:00 via INTRAVENOUS

## 2018-01-12 MED ORDER — SODIUM CHLORIDE 0.9 % IV SOLN
INTRAVENOUS | Status: DC
  Administered 2018-01-12: 10:00:00 via INTRAVENOUS

## 2018-01-12 MED ORDER — PROPOFOL 10 MG/ML IV BOLUS
INTRAVENOUS | Status: AC
  Filled 2018-01-12: qty 20

## 2018-01-12 MED ORDER — ONDANSETRON HCL 4 MG/2ML IJ SOLN
4.0000 mg | Freq: Four times a day (QID) | INTRAMUSCULAR | Status: DC | PRN
  Administered 2018-01-12 – 2018-01-13 (×3): 4 mg via INTRAVENOUS
  Filled 2018-01-12 (×3): qty 2

## 2018-01-12 MED ORDER — CHLORHEXIDINE GLUCONATE 0.12 % MT SOLN: 15.0000 mL | Freq: Once | OROMUCOSAL | Status: DC

## 2018-01-12 MED ORDER — ONDANSETRON HCL 4 MG/2ML IJ SOLN
INTRAMUSCULAR | Status: DC | PRN
  Administered 2018-01-12: 4 mg via INTRAVENOUS

## 2018-01-12 MED ORDER — PROTAMINE SULFATE 10 MG/ML IV SOLN
INTRAVENOUS | Status: DC | PRN
  Administered 2018-01-12: 40 mg via INTRAVENOUS
  Administered 2018-01-12: 60 mg via INTRAVENOUS
  Administered 2018-01-12: 10 mg via INTRAVENOUS

## 2018-01-12 MED ORDER — TRAMADOL HCL 50 MG PO TABS: 50.0000 mg | ORAL_TABLET | ORAL | Status: DC | PRN

## 2018-01-12 MED ORDER — PROPOFOL 500 MG/50ML IV EMUL
INTRAVENOUS | Status: DC | PRN
  Administered 2018-01-12: 20 ug/kg/min via INTRAVENOUS

## 2018-01-12 MED ORDER — SODIUM CHLORIDE 0.9 % IV SOLN
INTRAVENOUS | Status: AC
  Filled 2018-01-12 (×3): qty 1.2

## 2018-01-12 MED ORDER — LIDOCAINE HCL (PF) 1 % IJ SOLN
INTRAMUSCULAR | Status: DC | PRN
  Administered 2018-01-12: 18 mL

## 2018-01-12 MED ORDER — VANCOMYCIN HCL IN DEXTROSE 1-5 GM/200ML-% IV SOLN
1000.0000 mg | Freq: Once | INTRAVENOUS | Status: AC
  Administered 2018-01-12: 1000 mg via INTRAVENOUS
  Filled 2018-01-12: qty 200

## 2018-01-12 MED ORDER — METOPROLOL TARTRATE 5 MG/5ML IV SOLN: 2.5000 mg | INTRAVENOUS | Status: DC | PRN

## 2018-01-12 MED ORDER — OXYCODONE HCL 5 MG PO TABS: 5.0000 mg | ORAL_TABLET | ORAL | Status: DC | PRN

## 2018-01-12 MED ORDER — FENTANYL CITRATE (PF) 100 MCG/2ML IJ SOLN
INTRAMUSCULAR | Status: DC | PRN
  Administered 2018-01-12: 50 ug via INTRAVENOUS

## 2018-01-12 MED ORDER — SODIUM CHLORIDE 0.9 % IV SOLN: INTRAVENOUS | Status: DC

## 2018-01-12 MED ORDER — CHLORHEXIDINE GLUCONATE 0.12 % MT SOLN
OROMUCOSAL | Status: AC
  Administered 2018-01-12: 15 mL
  Filled 2018-01-12: qty 15

## 2018-01-12 MED ORDER — MIDAZOLAM HCL 2 MG/2ML IJ SOLN: 2.0000 mg | INTRAMUSCULAR | Status: DC | PRN

## 2018-01-12 MED ORDER — NITROGLYCERIN 0.2 MG/ML ON CALL CATH LAB
INTRAVENOUS | Status: DC | PRN
Start: 2018-01-12 — End: 2018-01-12
  Administered 2018-01-12: 20 ug via INTRAVENOUS

## 2018-01-12 MED ORDER — CLOPIDOGREL BISULFATE 75 MG PO TABS
75.0000 mg | ORAL_TABLET | Freq: Every day | ORAL | Status: DC
  Administered 2018-01-13: 75 mg via ORAL
  Filled 2018-01-12: qty 1

## 2018-01-12 MED ORDER — LIDOCAINE HCL (PF) 1 % IJ SOLN
INTRAMUSCULAR | Status: AC
  Filled 2018-01-12: qty 30

## 2018-01-12 MED ORDER — SODIUM CHLORIDE 0.9 % IJ SOLN
INTRAMUSCULAR | Status: AC
  Filled 2018-01-12: qty 10

## 2018-01-12 MED ORDER — SODIUM CHLORIDE 0.9 % IV SOLN
1.5000 g | Freq: Two times a day (BID) | INTRAVENOUS | Status: DC
  Administered 2018-01-12 – 2018-01-13 (×3): 1.5 g via INTRAVENOUS
  Filled 2018-01-12 (×4): qty 1.5

## 2018-01-12 MED ORDER — CHLORHEXIDINE GLUCONATE 4 % EX LIQD: 30.0000 mL | CUTANEOUS | Status: DC

## 2018-01-12 MED ORDER — LIDOCAINE HCL 1 % IJ SOLN
INTRAMUSCULAR | Status: DC | PRN
  Administered 2018-01-12: 15 mL

## 2018-01-12 MED ORDER — NITROGLYCERIN IN D5W 200-5 MCG/ML-% IV SOLN: 0.0000 ug/min | INTRAVENOUS | Status: DC

## 2018-01-12 MED ORDER — POTASSIUM CHLORIDE 10 MEQ/50ML IV SOLN
10.0000 meq | INTRAVENOUS | Status: AC
  Administered 2018-01-12: 50 meq via INTRAVENOUS
  Administered 2018-01-12: 10 meq via INTRAVENOUS
  Filled 2018-01-12 (×2): qty 50

## 2018-01-12 MED ORDER — PROPOFOL 10 MG/ML IV BOLUS
INTRAVENOUS | Status: DC | PRN
  Administered 2018-01-12: 10 mg via INTRAVENOUS
  Administered 2018-01-12: 30 mg via INTRAVENOUS

## 2018-01-12 MED ORDER — IODIXANOL 320 MG/ML IV SOLN
INTRAVENOUS | Status: DC | PRN
  Administered 2018-01-12: 55.3 mL via INTRAVENOUS

## 2018-01-12 MED ORDER — HEPARIN SODIUM (PORCINE) 1000 UNIT/ML IJ SOLN
INTRAMUSCULAR | Status: DC | PRN
  Administered 2018-01-12: 11000 [IU] via INTRAVENOUS

## 2018-01-12 MED ORDER — SODIUM CHLORIDE 0.9 % IV SOLN
0.0000 ug/min | INTRAVENOUS | Status: DC
  Administered 2018-01-12: 20 ug/min via INTRAVENOUS
  Administered 2018-01-13: 30 ug/min via INTRAVENOUS
  Filled 2018-01-12: qty 2
  Filled 2018-01-12: qty 20
  Filled 2018-01-12: qty 2

## 2018-01-12 MED ORDER — ACETAMINOPHEN 500 MG PO TABS
1000.0000 mg | ORAL_TABLET | Freq: Four times a day (QID) | ORAL | Status: DC
  Administered 2018-01-13 (×2): 1000 mg via ORAL
  Filled 2018-01-12 (×2): qty 2

## 2018-01-12 MED ORDER — CHLORHEXIDINE GLUCONATE 4 % EX LIQD: 60.0000 mL | Freq: Once | CUTANEOUS | Status: DC

## 2018-01-12 MED ORDER — FENTANYL CITRATE (PF) 250 MCG/5ML IJ SOLN
INTRAMUSCULAR | Status: AC
  Filled 2018-01-12: qty 5

## 2018-01-12 MED ORDER — EPHEDRINE SULFATE 50 MG/ML IJ SOLN
INTRAMUSCULAR | Status: DC | PRN
  Administered 2018-01-12 (×2): 5 mg via INTRAVENOUS

## 2018-01-12 MED ORDER — HEPARIN (PORCINE) IN NACL 1000-0.9 UT/500ML-% IV SOLN
INTRAVENOUS | Status: AC
  Filled 2018-01-12: qty 500

## 2018-01-12 MED ORDER — EPHEDRINE SULFATE 50 MG/ML IJ SOLN
INTRAMUSCULAR | Status: AC
  Filled 2018-01-12: qty 1

## 2018-01-12 MED ORDER — CLEVIDIPINE BUTYRATE 0.5 MG/ML IV EMUL
1.0000 mg/h | INTRAVENOUS | Status: DC
  Filled 2018-01-12: qty 50

## 2018-01-12 MED ORDER — LEVETIRACETAM 250 MG PO TABS
500.0000 mg | ORAL_TABLET | Freq: Two times a day (BID) | ORAL | Status: DC
  Administered 2018-01-12 – 2018-01-13 (×3): 500 mg via ORAL
  Filled 2018-01-12 (×3): qty 2
  Filled 2018-01-12: qty 1
  Filled 2018-01-12: qty 2

## 2018-01-12 MED ORDER — DOPAMINE-DEXTROSE 3.2-5 MG/ML-% IV SOLN
5.0000 ug/kg/min | INTRAVENOUS | Status: DC
  Administered 2018-01-12: 5 ug/kg/min via INTRAVENOUS

## 2018-01-12 MED ORDER — ROCURONIUM BROMIDE 10 MG/ML (PF) SYRINGE
PREFILLED_SYRINGE | INTRAVENOUS | Status: AC
  Filled 2018-01-12: qty 5

## 2018-01-12 MED ORDER — MORPHINE SULFATE (PF) 2 MG/ML IV SOLN
2.0000 mg | INTRAVENOUS | Status: DC | PRN
  Administered 2018-01-12: 2 mg via INTRAVENOUS
  Filled 2018-01-12: qty 2

## 2018-01-12 MED ORDER — ONDANSETRON HCL 4 MG/2ML IJ SOLN
INTRAMUSCULAR | Status: AC
  Filled 2018-01-12: qty 2

## 2018-01-12 MED ORDER — ASPIRIN 81 MG PO CHEW
81.0000 mg | CHEWABLE_TABLET | Freq: Every day | ORAL | Status: DC
  Administered 2018-01-13: 81 mg via ORAL
  Filled 2018-01-12 (×2): qty 1

## 2018-01-12 MED ORDER — SODIUM CHLORIDE 0.9 % IV SOLN
INTRAVENOUS | Status: DC | PRN
  Administered 2018-01-12: 1500 mL

## 2018-01-12 MED ORDER — HEPARIN (PORCINE) IN NACL 2-0.9 UNITS/ML
INTRAMUSCULAR | Status: AC | PRN
  Administered 2018-01-12: 500 mL

## 2018-01-12 MED ORDER — PANTOPRAZOLE SODIUM 40 MG PO TBEC
40.0000 mg | DELAYED_RELEASE_TABLET | Freq: Every day | ORAL | Status: DC
  Administered 2018-01-13: 40 mg via ORAL
  Filled 2018-01-12: qty 1

## 2018-01-12 MED ORDER — ALBUMIN HUMAN 5 % IV SOLN: 250.0000 mL | INTRAVENOUS | Status: AC | PRN

## 2018-01-12 MED ORDER — ACETAMINOPHEN 160 MG/5ML PO SOLN: 1000.0000 mg | Freq: Four times a day (QID) | ORAL | Status: DC

## 2018-01-12 SURGICAL SUPPLY — 11 items
CABLE ADAPT CONN TEMP 6FT (ADAPTER) ×1 IMPLANT
CATH S G BIP PACING (SET/KITS/TRAYS/PACK) ×1 IMPLANT
COVER PRB 48X5XTLSCP FOLD TPE (BAG) IMPLANT
COVER PROBE 5X48 (BAG) ×2
PINNACLE LONG 6F 25CM (SHEATH) ×2
PROTECTION STATION PRESSURIZED (MISCELLANEOUS) ×2
SHEATH INTRO PINNACLE 6F 25CM (SHEATH) IMPLANT
SHEATH PINNACLE 6F 10CM (SHEATH) IMPLANT
SLEEVE REPOSITIONING LENGTH 30 (MISCELLANEOUS) ×1 IMPLANT
STATION PROTECTION PRESSURIZED (MISCELLANEOUS) IMPLANT
WIRE EMERALD 3MM-J .035X150CM (WIRE) ×1 IMPLANT

## 2018-01-12 SURGICAL SUPPLY — 64 items
ADH SKN CLS APL DERMABOND .7 (GAUZE/BANDAGES/DRESSINGS) ×2
ADH SKN CLS LQ APL DERMABOND (GAUZE/BANDAGES/DRESSINGS) ×2
BAG DECANTER FOR FLEXI CONT (MISCELLANEOUS) ×2 IMPLANT
BAG SNAP BAND KOVER 36X36 (MISCELLANEOUS) ×4 IMPLANT
BLADE CLIPPER SURG (BLADE) IMPLANT
CABLE ADAPT CONN TEMP 6FT (ADAPTER) ×4 IMPLANT
CANISTER SUCT 3000ML PPV (MISCELLANEOUS) IMPLANT
CATH DIAG EXPO 6F VENT PIG 145 (CATHETERS) ×8 IMPLANT
CATH INFINITI 6F AL2 (CATHETERS) ×2 IMPLANT
CATH S G BIP PACING (SET/KITS/TRAYS/PACK) ×4 IMPLANT
CONT SPEC 4OZ CLIKSEAL STRL BL (MISCELLANEOUS) ×8 IMPLANT
COVER BACK TABLE 80X110 HD (DRAPES) ×6 IMPLANT
COVER DOME SNAP 22 D (MISCELLANEOUS) ×2 IMPLANT
CRADLE DONUT ADULT HEAD (MISCELLANEOUS) ×4 IMPLANT
DERMABOND ADHESIVE PROPEN (GAUZE/BANDAGES/DRESSINGS) ×2
DERMABOND ADVANCED (GAUZE/BANDAGES/DRESSINGS) ×2
DERMABOND ADVANCED .7 DNX12 (GAUZE/BANDAGES/DRESSINGS) ×2 IMPLANT
DERMABOND ADVANCED .7 DNX6 (GAUZE/BANDAGES/DRESSINGS) IMPLANT
DEVICE CLOSURE PERCLS PRGLD 6F (VASCULAR PRODUCTS) ×4 IMPLANT
DRSG TEGADERM 4X4.75 (GAUZE/BANDAGES/DRESSINGS) ×8 IMPLANT
ELECT REM PT RETURN 9FT ADLT (ELECTROSURGICAL) ×8
ELECTRODE REM PT RTRN 9FT ADLT (ELECTROSURGICAL) ×4 IMPLANT
GAUZE SPONGE 4X4 12PLY STRL (GAUZE/BANDAGES/DRESSINGS) ×6 IMPLANT
GLOVE BIO SURGEON STRL SZ7.5 (GLOVE) IMPLANT
GLOVE BIO SURGEON STRL SZ8 (GLOVE) ×2 IMPLANT
GLOVE EUDERMIC 7 POWDERFREE (GLOVE) ×2 IMPLANT
GOWN STRL REUS W/ TWL LRG LVL3 (GOWN DISPOSABLE) IMPLANT
GOWN STRL REUS W/ TWL XL LVL3 (GOWN DISPOSABLE) ×2 IMPLANT
GOWN STRL REUS W/TWL LRG LVL3 (GOWN DISPOSABLE) ×24
GOWN STRL REUS W/TWL XL LVL3 (GOWN DISPOSABLE) ×4
GUIDEWIRE SAF TJ AMPL .035X180 (WIRE) ×4 IMPLANT
GUIDEWIRE SAFE TJ AMPLATZ EXST (WIRE) ×4 IMPLANT
GUIDEWIRE STRAIGHT .035 260CM (WIRE) ×4 IMPLANT
KIT BASIN OR (CUSTOM PROCEDURE TRAY) ×4 IMPLANT
KIT HEART LEFT (KITS) ×4 IMPLANT
KIT TURNOVER KIT B (KITS) ×4 IMPLANT
NDL PERC 18GX7CM (NEEDLE) ×2 IMPLANT
NEEDLE PERC 18GX7CM (NEEDLE) ×4 IMPLANT
NS IRRIG 1000ML POUR BTL (IV SOLUTION) ×12 IMPLANT
PACK ENDOVASCULAR (PACKS) ×4 IMPLANT
PAD ARMBOARD 7.5X6 YLW CONV (MISCELLANEOUS) ×8 IMPLANT
PAD ELECT DEFIB RADIOL ZOLL (MISCELLANEOUS) ×4 IMPLANT
PENCIL BUTTON HOLSTER BLD 10FT (ELECTRODE) ×4 IMPLANT
PERCLOSE PROGLIDE 6F (VASCULAR PRODUCTS) ×8
SET MICROPUNCTURE 5F STIFF (MISCELLANEOUS) ×4 IMPLANT
SHEATH BRITE TIP 6FR 35CM (SHEATH) ×4 IMPLANT
SHEATH PINNACLE 6F 10CM (SHEATH) IMPLANT
SHEATH PINNACLE 8F 10CM (SHEATH) ×4 IMPLANT
SLEEVE REPOSITIONING LENGTH 30 (MISCELLANEOUS) ×4 IMPLANT
STOPCOCK MORSE 400PSI 3WAY (MISCELLANEOUS) ×10 IMPLANT
SUT SILK  1 MH (SUTURE) ×2
SUT SILK 1 MH (SUTURE) ×2 IMPLANT
SYR 50ML LL SCALE MARK (SYRINGE) ×4 IMPLANT
SYR BULB IRRIGATION 50ML (SYRINGE) IMPLANT
SYR CONTROL 10ML LL (SYRINGE) IMPLANT
TAPE CLOTH SURG 6X10 WHT LF (GAUZE/BANDAGES/DRESSINGS) ×2 IMPLANT
TOWEL GREEN STERILE (TOWEL DISPOSABLE) ×8 IMPLANT
TRANSDUCER W/STOPCOCK (MISCELLANEOUS) ×8 IMPLANT
TRAY FOLEY SLVR 14FR TEMP STAT (SET/KITS/TRAYS/PACK) IMPLANT
VALVE HEART TRANSCATH SZ3 23MM (Prosthesis & Implant Heart) ×2 IMPLANT
WIRE .035 3MM-J 145CM (WIRE) ×4 IMPLANT
WIRE BENTSON .035X145CM (WIRE) ×4 IMPLANT
WIRE EMERALD 3MM-J .035X150CM (WIRE) ×2 IMPLANT
WIRE TORQFLEX AUST .018X40CM (WIRE) ×2 IMPLANT

## 2018-01-12 NOTE — Anesthesia Postprocedure Evaluation (Signed)
Anesthesia Post Note  Patient: KLEBER CREAN  Procedure(s) Performed: TRANSCATHETER AORTIC VALVE REPLACEMENT, TRANSFEMORAL (N/A Chest) INTRAOPERATIVE TRANSTHORACIC ECHOCARDIOGRAM (N/A )     Patient location during evaluation: SICU Anesthesia Type: MAC Level of consciousness: awake and alert Pain management: pain level controlled Vital Signs Assessment: post-procedure vital signs reviewed and stable Respiratory status: spontaneous breathing, nonlabored ventilation, respiratory function stable and patient connected to nasal cannula oxygen Cardiovascular status: stable and blood pressure returned to baseline Postop Assessment: no apparent nausea or vomiting Anesthetic complications: no    Last Vitals:  Vitals:   01/21/2018 1830 01/11/2018 1944  BP: (!) 100/47   Pulse: (!) 59   Resp: (!) 3   Temp:  36.6 C  SpO2: 100%     Last Pain:  Vitals:   01/19/2018 1944  TempSrc: Oral  PainSc:                  Catalina Gravel

## 2018-01-12 NOTE — Progress Notes (Signed)
Called Samuel Thompson,PA to bedside due to patient HR 30s. Patient feeling nauseated, right sided chest discomfort and restless.  Dr. Excell Seltzerooper called to bedside.  Orders being given and initiated.

## 2018-01-12 NOTE — Anesthesia Procedure Notes (Signed)
Arterial Line Insertion Start/End06/07/2018 7:05 AM, 01/12/2018 7:25 AM Performed by: Jodell Ciproato, Kashish Yglesias A, CRNA, CRNA  Patient location: Pre-op. Preanesthetic checklist: patient identified, IV checked, site marked, risks and benefits discussed, surgical consent, monitors and equipment checked, pre-op evaluation, timeout performed and anesthesia consent Lidocaine 1% used for infiltration and patient sedated Left, radial was placed Catheter size: 20 G Hand hygiene performed  and maximum sterile barriers used  Allen's test indicative of satisfactory collateral circulation Attempts: 2 (Attempt to R radial ) Procedure performed without using ultrasound guided technique. Following insertion, dressing applied and Biopatch. Post procedure assessment: normal  Patient tolerated the procedure well with no immediate complications.

## 2018-01-12 NOTE — Op Note (Signed)
[facility]   MULTIDISCIPLINARY HEART VALVE TEAM   TAVR OPERATIVE NOTE   Date of Procedure:  26-Jan-2018  Preoperative Diagnosis: Severe Aortic Stenosis   Postoperative Diagnosis: Same   Procedure:    Transcatheter Aortic Valve Replacement - Percutaneous Right Transfemoral Approach  Edwards Sapien 3 THV (size 23 mm, model # 9600TFX, serial # [id number])   Co-Surgeons:  Alleen Borne, MD and Tonny Bollman, MD    Anesthesiologist:  Arrie Aran, MD  Echocardiographer:  Tobias Alexander, MD  Pre-operative Echo Findings:  Severe aortic stenosis  Normal left ventricular systolic function  Post-operative Echo Findings:  No paravalvular leak  Normal left ventricular systolic function   BRIEF CLINICAL NOTE AND INDICATIONS FOR SURGERY  This 84 year old gentleman has stage D, critical, symptomatic aortic stenosis with New York Heart Association class II symptoms of exertional fatigue and tiredness consistent with chronic diastolic congestive heart failure. He presented with an acute ischemic right brain stroke on 10/09/2017 which is likely due to a combination of a 70 to 80% right carotid bulb stenosis in addition to critical aortic stenosis. He has made a good recovery from that and is now home and independent. In addition he also has three-vessel coronary disease and underwent PCI stenting of a high-grade mid right coronary stenosis on 11/04/2017. He denies any anginal symptoms at this time. He denies any exertional shortness of breath but is not that active at this time.  I have personally reviewed his echocardiogram, cardiac catheterization, and CTA studies. His echocardiogram shows a trileaflet aortic valve with moderate thickening and severely calcified leaflets with restricted mobility. The mean gradient is 71 mmHg consistent with critical aortic stenosis. Left ventricular systolic function is normal with grade 1 diastolic dysfunction. Cardiac catheterization showed  three-vessel coronary disease with a high-grade right coronary stenosis that was treated successfully with a drug-eluting stent. I agree that aortic valve replacement is indicated in this patient with critical aortic stenosis. Although he is 84 years old he is still independent at home after recovering from the recent stroke and prior to that was very active. I think he would be at moderate to high risk for open surgical aortic valve replacement given his age and recent stroke. I think transcatheter aortic valve replacement would be a reasonable alternative for him with much less risk and a much quicker recovery. His gated cardiac CTA shows anatomy suitable for transcatheter aortic valve replacement without any significant complicating features. His abdominal and pelvic CTA shows diffuse calcific atherosclerosis involving the abdominal aorta and iliofemoral vessels. There is a high-grade stenosis of the left common iliac artery which is probably large enough for diagnostic catheter but not for valve insertion. The right iliofemoral system looks like it may be suitable for transcatheter valve replacement using a 14 French sheath. The left axillary artery also looks like it would be suitable if needed.   The patient and his nephew were counseled at length regarding treatment alternatives for management of severe symptomatic aortic stenosis. The risks and benefits of surgical intervention have been discussed in detail. Long-term prognosis with medical therapy was discussed. Alternative approaches such as conventional surgical aortic valve replacement, transcatheter aortic valve replacement, and palliative medical therapy were compared and contrasted at length. This discussion was placed in the context of the patient's own specific clinical presentation and past medical history. All of their questions been addressed. The patient is eager to proceed with surgical management as soon as possible.   The patient has  been advised  of a variety of complications that might develop including but not limited to risks of death, stroke, paravalvular leak, aortic dissection or other major vascular complications, aortic annulus rupture, device embolization, cardiac rupture or perforation, mitral regurgitation, acute myocardial infarction, arrhythmia, heart block or bradycardia requiring permanent pacemaker placement, congestive heart failure, respiratory failure, renal failure, pneumonia, infection, other late complications related to structural valve deterioration or migration, or other complications that might ultimately cause a temporary or permanent loss of functional independence or other long term morbidity. The patient provides full informed consent for the procedure as described and all questions were answered.     DETAILS OF THE OPERATIVE PROCEDURE  PREPARATION:    The patient is brought to the operating room on the above mentioned date and central monitoring was established by the anesthesia team including placement of a central venous line and radial arterial line. The patient is placed in the supine position on the operating table.  Intravenous antibiotics are administered. The patient is monitored closely throughout the procedure under conscious sedation.  Baseline transthoracic echocardiogram was performed. The patient's chest, abdomen, both groins, and both lower extremities are prepared and draped in a sterile manner. A time out procedure is performed.   PERIPHERAL ACCESS:    Using the modified Seldinger technique, femoral arterial and venous access was obtained with placement of 6 Fr sheaths on the left side.  A pigtail diagnostic catheter was passed through the left arterial sheath under fluoroscopic guidance into the aortic root.  A temporary transvenous pacemaker catheter was passed through the left femoral venous sheath under fluoroscopic guidance into the right ventricle.  The pacemaker was tested to  ensure stable lead placement and pacemaker capture. Aortic root angiography was performed in order to determine the optimal angiographic angle for valve deployment.   TRANSFEMORAL ACCESS:   Percutaneous transfemoral access and sheath placement was performed using ultrasound guidance.  The right common femoral artery was cannulated using a micropuncture needle and appropriate location was verified using hand injection angiogram.  A pair of Abbott Perclose percutaneous closure devices were placed and a 6 French sheath replaced into the femoral artery.  The patient was heparinized systemically and ACT verified > 250 seconds.    A 14 Fr transfemoral E-sheath was introduced into the right femoral artery after progressively dilating over an Amplatz superstiff wire. An AL-2 catheter was used to direct a straight-tip exchange length wire across the native aortic valve into the left ventricle. This was exchanged out for a pigtail catheter and position was confirmed in the LV apex. Simultaneous LV and Ao pressures were recorded.  The pigtail catheter was exchanged for an Amplatz Extra-stiff wire in the LV apex.  Echocardiography was utilized to confirm appropriate wire position and no sign of entanglement in the mitral subvalvular apparatus.   BALLOON AORTIC VALVULOPLASTY:   Balloon aortic valvuloplasty was performed using a 20 mm valvuloplasty balloon.  Once optimal position was achieved, BAV was done under rapid ventricular pacing. The patient recovered well hemodynamically.    TRANSCATHETER HEART VALVE DEPLOYMENT:   An Edwards Sapien 3 transcatheter heart valve (size 23 mm, model #9600TFX, serial #[id number]) was prepared and crimped per manufacturer's guidelines, and the proper orientation of the valve is confirmed on the [employer] delivery system. The valve was advanced through the introducer sheath using normal technique until in an appropriate position in the abdominal aorta beyond the sheath tip.  The balloon was then retracted and using the fine-tuning wheel was centered on the  valve. The valve was then advanced across the aortic arch using appropriate flexion of the catheter. The valve was carefully positioned across the aortic valve annulus. The Commander catheter was retracted using normal technique. Once final position of the valve has been confirmed by angiographic assessment, the valve is deployed while temporarily holding ventilation and during rapid ventricular pacing to maintain systolic blood pressure < 50 mmHg and pulse pressure < 10 mmHg. The balloon inflation is held for >3 seconds after reaching full deployment volume. Once the balloon has fully deflated the balloon is retracted into the ascending aorta and valve function is assessed using echocardiography. There is felt to be no paravalvular leak and no central aortic insufficiency.  The patient's hemodynamic recovery following valve deployment is good.  The deployment balloon and guidewire are both removed.    PROCEDURE COMPLETION:   The sheath was removed and femoral artery closure performed using the previously placed Perclose devices.   Protamine was administered once femoral arterial repair was complete. The temporary pacemaker, pigtail catheters and femoral sheaths were removed with manual pressure used for hemostasis.   The patient tolerated the procedure well and is transported to the surgical intensive care in stable condition. There were no immediate intraoperative complications. All sponge instrument and needle counts are verified correct at completion of the operation.   No blood products were administered during the operation.  The patient received a total of 55 mL of intravenous contrast during the procedure.   Alleen BorneBryan K Donny Heffern, MD 01/28/2018 10:46 AM

## 2018-01-12 NOTE — Progress Notes (Signed)
CTSP for symptomatic hypotension/junctional bradycardia. Now transcutaneously pacemaker dependent. Patient now with BP approximately 90/60, transcutaneously paced. Plan emergent temporary pacemaker placement. Emergency implied consent obtained. I called his nephew who takes care of him and informed him of plans for emergent temp pacemaker in cath lab. He understands.   Tonny BollmanMichael Margarete Horace 01/16/2018 12:57 PM

## 2018-01-12 NOTE — Interval H&P Note (Signed)
History and Physical Interval Note:  01/24/2018 7:48 AM  Samuel Vega  has presented today for surgery, with the diagnosis of Severe Aortic Stenosis  The various methods of treatment have been discussed with the patient and family. After consideration of risks, benefits and other options for treatment, the patient has consented to  Procedure(s): TRANSCATHETER AORTIC VALVE REPLACEMENT, TRANSFEMORAL (N/A) TRANSESOPHAGEAL ECHOCARDIOGRAM (TEE) (N/A) as a surgical intervention .  The patient's history has been reviewed, patient examined, no change in status, stable for surgery.  I have reviewed the patient's chart and labs.  Questions were answered to the patient's satisfaction.     [name]

## 2018-01-12 NOTE — Op Note (Signed)
[facility]   MULTIDISCIPLINARY HEART VALVE TEAM   TAVR OPERATIVE NOTE   Date of Procedure:  01/15/2018  Preoperative Diagnosis: Severe Aortic Stenosis   Postoperative Diagnosis: Same   Procedure:    Transcatheter Aortic Valve Replacement - Percutaneous  Transfemoral Approach  Edwards Sapien 3 THV (size 23 mm, model # 9600TFX, serial # [id number])   Co-Surgeons:  Alleen BorneBryan K. Bartle, MD and Tonny BollmanMichael Navi Erber, MD  Anesthesiologist:  Arrie AranStephen Turk, MD  Echocardiographer:  Tobias AlexanderKatarina Nelson, MD  Pre-operative Echo Findings:  Severe aortic stenosis  Normal left ventricular systolic function  Post-operative Echo Findings:  No paravalvular leak  Normal (unchanged) left ventricular systolic function  BRIEF CLINICAL NOTE AND INDICATIONS FOR SURGERY  84 yo male with a longstanding heart murmur, recently presenting with an syncopal episode and right parietal stroke, found to have critical aoritc stenosis with peak and mean gradients of 111 and 71 mmHg. He is evaluated by a multidisciplinary heart team and underwent multiple studies with a recommendation to proceed with TAVR for treatment of severe, Stage D1 aortic stenosis.   During the course of the patient's preoperative work up they have been evaluated comprehensively by a multidisciplinary team of specialists coordinated through [facility].  They have been demonstrated to suffer from symptomatic severe aortic stenosis as noted above. The patient has been counseled extensively as to the relative risks and benefits of all options for the treatment of severe aortic stenosis including long term medical therapy, conventional surgery for aortic valve replacement, and transcatheter aortic valve replacement.  The patient has been independently evaluated by two cardiac surgeons including Dr. Cornelius Moraswen and Dr. Laneta SimmersBartle, and they are felt to be at high risk for conventional  surgical aortic valve replacement. Both surgeons indicated the patient would be a poor candidate for conventional surgery because of comorbidities including very advanced age, reduced functional capacity, and recent stroke.  Based upon review of all of the patient's preoperative diagnostic tests they are felt to be candidate for transcatheter aortic valve replacement using the transfemoral approach as an alternative to high risk conventional surgery.    Following the decision to proceed with transcatheter aortic valve replacement, a discussion has been held regarding what types of management strategies would be attempted intraoperatively in the event of life-threatening complications, including whether or not the patient would be considered a candidate for the use of cardiopulmonary bypass and/or conversion to open sternotomy for attempted surgical intervention.  The patient has been advised of a variety of complications that might develop peculiar to this approach including but not limited to risks of death, stroke, paravalvular leak, aortic dissection or other major vascular complications, aortic annulus rupture, device embolization, cardiac rupture or perforation, acute myocardial infarction, arrhythmia, heart block or bradycardia requiring permanent pacemaker placement, congestive heart failure, respiratory failure, renal failure, pneumonia, infection, other late complications related to structural valve deterioration or migration, or other complications that might ultimately cause a temporary or permanent loss of functional independence or other long term morbidity.  The patient provides full informed consent for the procedure as described and all questions were answered preoperatively.  DETAILS OF THE OPERATIVE PROCEDURE  PREPARATION:   The patient is brought to the operating room on the above mentioned date and central monitoring was established by the anesthesia team including placement of a central  venous catheter and radial arterial line. The patient is placed in the supine position on the operating table.  Intravenous antibiotics are administered. The patient is monitored closely throughout the procedure under conscious sedation.  Baseline transthoracic echocardiogram is performed. The patient's chest, abdomen, both groins, and both lower extremities are prepared and draped in a sterile manner. A time out procedure is performed.   PERIPHERAL ACCESS:   Using ultrasound guidance, femoral arterial and venous access is obtained with placement of 6 Fr sheaths on the left side.  Ultrasound images are captured and stored in the patient's chart. A pigtail diagnostic catheter was passed through the femoral arterial sheath under fluoroscopic guidance into the aortic root.  A temporary transvenous pacemaker catheter was passed through the femoral venous sheath under fluoroscopic guidance into the right ventricle.  The pacemaker was tested to ensure stable lead placement and pacemaker capture. Aortic root angiography was performed in order to determine the optimal angiographic angle for valve deployment.  TRANSFEMORAL ACCESS:  A micropuncture technique is used to access the right femoral artery under fluoroscopic and ultrasound guidance.  2 Perclose devices are deployed at 10' and 2' positions to 'PreClose' the femoral artery. An 8 French sheath is placed and then an Amplatz Superstiff wire is advanced through the sheath. This is changed out for a 14 French transfemoral E-Sheath after progressively dilating over the Superstiff wire.  An AL-2 catheter was used to direct a straight-tip exchange length wire across the native aortic valve into the left ventricle. This was exchanged out for a pigtail catheter and position was confirmed in the LV apex. Simultaneous LV and Ao pressures were recorded.  The pigtail catheter was exchanged for an Amplatz Extra-stiff wire in the LV apex.  Echocardiography was utilized to  confirm appropriate wire position and no sign of entanglement in the mitral subvalvular apparatus.  BALLOON AORTIC VALVULOPLASTY:  Balloon aortic valvuloplasty was performed using a 20 mm valvuloplasty balloon.  Once optimal position was achieved, BAV was done under rapid ventricular pacing. The patient recovered well hemodynamically.   TRANSCATHETER HEART VALVE DEPLOYMENT:  An Edwards Sapien 3 transcatheter heart valve (size 23 mm, model #9600TFX, serial #[id number]) was prepared and crimped per manufacturer's guidelines, and the proper orientation of the valve is confirmed on the [employer] delivery system. The valve was advanced through the introducer sheath using normal technique until in an appropriate position in the abdominal aorta beyond the sheath tip. The balloon was then retracted and using the fine-tuning wheel was centered on the valve. The valve was then advanced across the aortic arch using appropriate flexion of the catheter. The valve was carefully positioned across the aortic valve annulus. The Commander catheter was retracted using normal technique. Once final position of the valve has been confirmed by angiographic assessment, the valve is deployed while temporarily holding ventilation and during rapid ventricular pacing to maintain systolic blood pressure < 50 mmHg and pulse pressure < 10 mmHg. The balloon inflation is held for >3 seconds after reaching full deployment volume. Once the balloon has fully deflated the balloon is retracted into the ascending aorta and valve function is assessed using echocardiography. There is felt to be no paravalvular leak and no central aortic insufficiency.  The patient's hemodynamic recovery following valve deployment is good.  The deployment balloon and guidewire are both removed. Echo demostrated acceptable post-procedural gradients, stable mitral valve function, and no aortic insufficiency.    PROCEDURE COMPLETION:  The sheath was removed and  femoral artery closure is performed using the 2 previously deployed Perclose devices.  Protamine is administered once femoral arterial repair was complete.  The site is clear with no evidence of bleeding or hematoma after the sutures are tightened. The temporary pacemaker, pigtail catheters and femoral sheaths were removed with manual pressure used for hemostasis.   The patient tolerated the procedure well and is transported to the surgical intensive care in stable condition. There were no immediate intraoperative complications. All sponge instrument and needle counts are verified correct at completion of the operation.   The patient received a total of 55 mL of intravenous contrast during the procedure.   Tonny Bollman, MD 02-04-2018 9:24 AM

## 2018-01-12 NOTE — Anesthesia Procedure Notes (Signed)
Procedure Name: MAC Date/Time: 01/24/2018 7:31 AM Performed by: Catalina Gravel, MD Oxygen Delivery Method: Simple face mask Placement Confirmation: positive ETCO2

## 2018-01-12 NOTE — Progress Notes (Signed)
  [facility]   MULTIDISCIPLINARY HEART VALVE TEAM  Patient doing well s/p TAVR. He is hemodynamically stable. Groin sites stable. ECG with marked sinus bradycardia but no high grade block. Still groggy from surgery and complaining of intermittent pain under his right axilla. Physical exam is normal. We will continue to monitor for now.   Cline CrockKathryn Juhi Lagrange PA-C  [facility]  Pager [phone]

## 2018-01-12 NOTE — Transfer of Care (Signed)
Immediate Anesthesia Transfer of Care Note  Patient: Samuel Vega  Procedure(s) Performed: TRANSCATHETER AORTIC VALVE REPLACEMENT, TRANSFEMORAL (N/A Chest) INTRAOPERATIVE TRANSTHORACIC ECHOCARDIOGRAM (N/A )  Patient Location: SICU  Anesthesia Type:MAC  Level of Consciousness: sedated  Airway & Oxygen Therapy: Patient Spontanous Breathing and Patient connected to face mask oxygen  Post-op Assessment: Report given to RN and Post -op Vital signs reviewed and stable  Post vital signs: Reviewed and stable  Last Vitals:  Vitals Value Taken Time  BP    Temp    Pulse    Resp    SpO2      Last Pain:  Vitals:   02/05/2018 0634  TempSrc:   PainSc: 5          Complications: No apparent anesthesia complications

## 2018-01-12 NOTE — Progress Notes (Signed)
PT Cancellation Note  Patient Details Name: Samuel Vega MRN: [id number] DOB: 11/19/1923   Cancelled Treatment:    Reason Eval/Treat Not Completed: Medical issues which prohibited therapy(pt s/p TAVR and back in cath lab for pacemaker. Will attempt next date)   Paislee Szatkowski B Daizha Anand 01/31/2018, 1:09 PM  Delaney MeigsMaija Tabor Navy Rothschild, PT [phone]

## 2018-01-12 NOTE — Anesthesia Procedure Notes (Signed)
Central Venous Catheter Insertion Performed by: Cecile Hearingurk, Shakthi Scipio Edward, MD, anesthesiologist Start/End06/05/2018 6:55 AM, 01/12/2018 7:05 AM Patient location: Pre-op. Preanesthetic checklist: patient identified, IV checked, site marked, risks and benefits discussed, surgical consent, monitors and equipment checked, pre-op evaluation, timeout performed and anesthesia consent Position: Trendelenburg Lidocaine 1% used for infiltration and patient sedated Hand hygiene performed , maximum sterile barriers used  and Seldinger technique used Catheter size: 8 Fr Total catheter length 16. Central line was placed.Double lumen Procedure performed using ultrasound guided technique. Ultrasound Notes:anatomy identified, needle tip was noted to be adjacent to the nerve/plexus identified, no ultrasound evidence of intravascular and/or intraneural injection and image(s) printed for medical record Attempts: 1 Following insertion, dressing applied, line sutured and Biopatch. Post procedure assessment: blood return through all ports, free fluid flow and no air  Patient tolerated the procedure well with no immediate complications.

## 2018-01-13 ENCOUNTER — Encounter (HOSPITAL_COMMUNITY): Payer: Self-pay | Admitting: Cardiovascular Disease

## 2018-01-13 ENCOUNTER — Encounter: Payer: PPO | Admitting: Vascular Surgery

## 2018-01-13 ENCOUNTER — Encounter (HOSPITAL_COMMUNITY): Admission: RE | Disposition: E | Payer: Self-pay | Source: Ambulatory Visit | Attending: Surgery

## 2018-01-13 ENCOUNTER — Inpatient Hospital Stay (HOSPITAL_COMMUNITY): Payer: PPO

## 2018-01-13 ENCOUNTER — Telehealth: Payer: Self-pay | Admitting: Vascular Surgery

## 2018-01-13 ENCOUNTER — Ambulatory Visit: Payer: PPO | Admitting: Vascular Surgery

## 2018-01-13 ENCOUNTER — Other Ambulatory Visit: Payer: Self-pay | Admitting: Physician Assistant

## 2018-01-13 DIAGNOSIS — I350 Nonrheumatic aortic (valve) stenosis: Principal | ICD-10-CM

## 2018-01-13 DIAGNOSIS — Z952 Presence of prosthetic heart valve: Secondary | ICD-10-CM

## 2018-01-13 DIAGNOSIS — I340 Nonrheumatic mitral (valve) insufficiency: Secondary | ICD-10-CM

## 2018-01-13 DIAGNOSIS — I442 Atrioventricular block, complete: Secondary | ICD-10-CM

## 2018-01-13 LAB — BASIC METABOLIC PANEL
ANION GAP: 6 (ref 5–15)
BUN: 16 mg/dL (ref 6–20)
CO2: 22 mmol/L (ref 22–32)
Calcium: 8.6 mg/dL — ABNORMAL LOW (ref 8.9–10.3)
Chloride: 109 mmol/L (ref 101–111)
Creatinine, Ser: 1.08 mg/dL (ref 0.61–1.24)
GFR calc Af Amer: >60 mL/min (ref >60)
GFR, EST NON AFRICAN AMERICAN: 57 mL/min — AB (ref >60)
GLUCOSE: 143 mg/dL — AB (ref 65–99)
POTASSIUM: 4.5 mmol/L (ref 3.5–5.1)
Sodium: 137 mmol/L (ref 135–145)

## 2018-01-13 LAB — ECHOCARDIOGRAM LIMITED: Weight: 2515.01 oz

## 2018-01-13 LAB — CBC
HEMATOCRIT: 33.6 % — AB (ref 39.0–52.0)
Hemoglobin: 10.6 g/dL — ABNORMAL LOW (ref 13.0–17.0)
MCH: 27.5 pg (ref 26.0–34.0)
MCHC: 31.5 g/dL (ref 30.0–36.0)
MCV: 87.3 fL (ref 78.0–100.0)
Platelets: 185 10*3/uL (ref 150–400)
RBC: 3.85 MIL/uL — AB (ref 4.22–5.81)
RDW: 14.4 % (ref 11.5–15.5)
WBC: 11.1 10*3/uL — AB (ref 4.0–10.5)

## 2018-01-13 LAB — MAGNESIUM: Magnesium: 1.9 mg/dL (ref 1.7–2.4)

## 2018-01-13 SURGERY — PACEMAKER IMPLANT

## 2018-01-13 MED ORDER — POLYETHYLENE GLYCOL 3350 17 G PO PACK
17.0000 g | PACK | Freq: Every day | ORAL | Status: DC
Start: 2018-01-13 — End: 2018-01-14
  Administered 2018-01-13: 17 g via ORAL
  Filled 2018-01-13: qty 1

## 2018-01-13 MED ORDER — CEFAZOLIN SODIUM-DEXTROSE 2-4 GM/100ML-% IV SOLN
2.0000 g | INTRAVENOUS | Status: DC
  Filled 2018-01-13: qty 100

## 2018-01-13 MED ORDER — SODIUM CHLORIDE 0.9 % IV SOLN: INTRAVENOUS | Status: DC

## 2018-01-13 MED ORDER — SODIUM CHLORIDE 0.9 % IV SOLN
80.0000 mg | INTRAVENOUS | Status: DC
  Filled 2018-01-13: qty 2

## 2018-01-13 MED ORDER — POLYETHYLENE GLYCOL 3350 17 G PO PACK: 17.0000 g | PACK | Freq: Every day | ORAL | Status: DC | PRN

## 2018-01-13 MED ORDER — CHLORHEXIDINE GLUCONATE 4 % EX LIQD
60.0000 mL | Freq: Once | CUTANEOUS | Status: DC
  Filled 2018-01-13: qty 60

## 2018-01-13 MED ORDER — CHLORHEXIDINE GLUCONATE 4 % EX LIQD: 60.0000 mL | Freq: Once | CUTANEOUS | Status: DC

## 2018-01-13 MED ORDER — ATROPINE SULFATE 1 MG/10ML IJ SOSY
PREFILLED_SYRINGE | INTRAMUSCULAR | Status: AC
  Filled 2018-01-13: qty 10

## 2018-01-13 NOTE — Consult Note (Addendum)
ELECTROPHYSIOLOGY CONSULT NOTE    Patient ID: Samuel Vega MRN: [id number], DOB/AGE: 1924-03-15 84 y.o.  Admit date: 01/23/2018 Date of Consult: 2018/01/23  Primary Physician: Porfirio Oar, PA-C Primary Cardiologist: Katrinka Blazing Structural: Clifton James Electrophysiologist: Graciela Husbands   Patient Profile: Samuel Vega is a 84 y.o. male with a history of prior CVA, CAD, hypertension, hyperlipidemia, and severe AS s/p TAVR who is being seen today for the evaluation of symptomatic bradycardia at the request of Dr Excell Seltzer.  HPI:  Samuel Vega is a 84 y.o. male with the above past medical history. He underwent TAVR yesterday (Edwards valve) and did well initially. Post op, he went into 2:1 heart block with hypotension and required emergent temporary pacing wire placement. He removed it overnight himself and was placed on Dopamine for heart rate support.  He continues on Dopamine this morning with heart rates in the 50-60's.  Conversation with the patient is very difficult today as he feels that no one told him soon enough about his bradycardia and he doesn't remember anything about Dr Earmon Phoenix conversations yesterday.  He is also fixated on bowel and bladder function this morning.  He denies chest pain, shortness of breath, dizziness. His nephew is at the bedside.   Echo today demonstrates EF 60-65%, severe LVH, well seated valve with trivial perivalvular leak.   Past Medical History:  Diagnosis Date  . Acute CVA (cerebrovascular accident) (HCC) 10/09/2017  . Anxiety   . Aortic stenosis, severe    a. 02/02/2018: s/p TAVR wtih an Edwards Sapien 3 THV (size 23 mm, model # 9600TFX, serial # [id number])  . CAD (coronary artery disease)    a. s/p prox RCA DES x1 11/04/17  . Chronic lower back pain   . Depression   . ED (erectile dysfunction)   . HTN (hypertension)   . Hyperlipidemia   . Macular degeneration   . PTSD (post-traumatic stress disorder)   . Seizure (HCC)   . 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 CATARACT EXTRACTION, BILATERAL Bilateral   . CORONARY STENT INTERVENTION N/A 11/04/2017   Procedure: CORONARY STENT INTERVENTION;  Surgeon: Kathleene Hazel, MD;  Location: [facility] INVASIVE CV LAB;  Service: Cardiovascular;  Laterality: N/A;  . ELBOW SURGERY  1980s   "? side; hurt when I put it down; had to have nerves clipped"  . EYE SURGERY    . IR RADIOLOGY PERIPHERAL GUIDED IV START  11/23/2017  . IR US GUIDE VASC ACCESS RIGHT  11/23/2017  . RIGHT/LEFT HEART CATH AND CORONARY ANGIOGRAPHY N/A 11/04/2017   Procedure: RIGHT/LEFT HEART CATH AND CORONARY ANGIOGRAPHY;  Surgeon: Kathleene Hazel, MD;  Location: [facility] INVASIVE CV LAB;  Service: Cardiovascular;  Laterality: N/A;  . TEMPORARY PACEMAKER N/A 01/27/2018   Procedure: TEMPORARY PACEMAKER;  Surgeon: Tonny Bollman, MD;  Location: [facility] INVASIVE CV LAB;  Service: Cardiovascular;  Laterality: N/A;     Medications Prior to Admission  Medication Sig Dispense Refill Last Dose  . acetaminophen (TYLENOL) 500 MG tablet Take 500 mg by mouth every 6 (six) hours as needed.   Past Week at Unknown time  . aspirin 81 MG chewable tablet Chew 1 tablet (81 mg total) by mouth daily. 60 tablet 3 01/10/2018  . clopidogrel (PLAVIX) 75 MG tablet Take 1 tablet (75 mg total) by mouth daily with breakfast. 90 tablet 3 01/10/2018  .  levETIRAcetam (KEPPRA) 500 MG tablet Take 1 tablet (500 mg total) by mouth 2 (two) times daily. 180 tablet 3 01/11/2018 at Unknown time  . polyethylene glycol (MIRALAX / GLYCOLAX) packet Take 17 g by mouth daily as needed for moderate constipation.   01/11/2018 at Unknown time  . docusate sodium (COLACE) 100 MG capsule Take 1 capsule (100 mg total) by mouth daily as needed for mild constipation. (Patient not taking: Reported on 01/01/2018) 90 capsule 3 Not Taking at Unknown time  . nitroGLYCERIN (NITROSTAT) 0.4  MG SL tablet Place 1 tablet (0.4 mg total) under the tongue every 5 (five) minutes as needed for chest pain. 25 tablet 0 More than a month at Unknown time  . traZODone (DESYREL) 100 MG tablet Take 1 tablet (100 mg total) by mouth at bedtime. (Patient not taking: Reported on 12/30/2017) 90 tablet 3 Not Taking at Unknown time    Inpatient Medications:  . acetaminophen  1,000 mg Oral Q6H   Or  . acetaminophen (TYLENOL) oral liquid 160 mg/5 mL  1,000 mg Per Tube Q6H  . aspirin  81 mg Oral Daily  . atropine      . clopidogrel  75 mg Oral Q breakfast  . levETIRAcetam  500 mg Oral BID  . pantoprazole  40 mg Oral Daily  . polyethylene glycol  17 g Oral Daily    Allergies:  Allergies  Allergen Reactions  . Statins Other (See Comments)    MYALGIAS    Social History   Socioeconomic History  . Marital status: Divorced    Spouse name: n/a  . Number of children: 2  . Years of education: Not on file  . Highest education level: Not on file  Occupational History  . Occupation: retired  Engineer, production  . Financial resource strain: Not on file  . Food insecurity:    Worry: Not on file    Inability: Not on file  . Transportation needs:    Medical: Not on file    Non-medical: Not on file  Tobacco Use  . Smoking status: Former Smoker    Years: 2.00    Types: Cigarettes  . Smokeless tobacco: Never Used  . Tobacco comment: "none since 1980s" (11/04/2017)  Substance and Sexual Activity  . Alcohol use: No    Comment: none since 1980  . Drug use: Never  . Sexual activity: Yes    Partners: Female  Lifestyle  . Physical activity:    Days per week: Not on file    Minutes per session: Not on file  . Stress: Not on file  Relationships  . Social connections:    Talks on phone: Not on file    Gets together: Not on file    Attends religious service: Not on file    Active member of club or organization: Not on file    Attends meetings of clubs or organizations: Not on file    Relationship  status: Not on file  . Intimate partner violence:    Fear of current or ex partner: Not on file    Emotionally abused: Not on file    Physically abused: Not on file    Forced sexual activity: Not on file  Other Topics Concern  . Not on file  Social History Narrative   Divorced in [place]. 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  His grandson is a Optometrist.     Family History  Problem Relation Age of Onset  . Hypertension Other      Review of Systems:  All other systems reviewed and are otherwise negative except as noted above.  Physical Exam: Vitals:   02/04/2018 0600 01/21/2018 0700 01/10/2018 0715 01/12/2018 0800  BP: 94/70 (!) 130/48 (!) 131/45   Pulse: 74 75 75   Resp: 15 20 18    Temp:    98 F (36.7 C)  TempSrc:    Oral  SpO2: 98% 100% 100%   Weight:        GEN- The patient is elderly appearing, alert and oriented x 3 today, frequent redirection required.   HEENT: normocephalic, atraumatic; sclera clear, conjunctiva pink; hearing intact; oropharynx clear; neck supple Lungs- Clear to ausculation bilaterally, normal work of breathing.  No wheezes, rales, rhonchi Heart- Regular rate and rhythm  GI- soft, non-tender, non-distended, bowel sounds present Extremities- no clubbing, cyanosis, or edema  MS- no significant deformity or atrophy Skin- warm and dry, no rash or lesion Psych- euthymic mood, full affect Neuro- strength and sensation are intact  Labs:   Lab Results  Component Value Date   WBC 11.1 (H) 01/28/2018   HGB 10.6 (L) 01/24/2018   HCT 33.6 (L) 02/03/2018   MCV 87.3 01/12/2018   PLT 185 01/19/2018    Recent Labs  Lab 01/08/18 1348  01/13/18 0523  NA 139   < > 137  K 3.8   < > 4.5  CL 108   < > 109  CO2 22  --  22  BUN 10   < > 16  CREATININE 0.92   < > 1.08  CALCIUM 8.7*  --  8.6*  PROT 6.1*  --   --   BILITOT 0.6  --   --   ALKPHOS 69  --   --   ALT 11*  --   --   AST 21  --   --   GLUCOSE 100*   < > 143*   < > = values in this interval not displayed.        Radiology/Studies: Dg Chest 2 View  Result Date: 01/09/2018 CLINICAL DATA:  Preoperative study prior to aortic valve replacement EXAM: CHEST - 2 VIEW COMPARISON:  None. FINDINGS: Anterior wedging of lower thoracic vertebral bodies is age indeterminate but may be nonacute given history. Recommend clinical correlation. The heart, hila, mediastinum, lungs, and pleura are unremarkable. IMPRESSION: Anterior wedging of lower thoracic vertebral bodies is age indeterminate. Recommend clinical correlation. No other abnormalities. Electronically Signed   By: Gerome Sam III M.D   On: 01/09/2018 09:34   Dg Chest Port 1 View  Result Date: 02/11/18 CLINICAL DATA:  Status post transcatheter aortic valve replacement EXAM: PORTABLE CHEST 1 VIEW COMPARISON:  Jan 08, 2018 FINDINGS: Lungs are clear. Heart is borderline enlarged with pulmonary vascularity normal. There is an aortic valve replacement. Central catheter tip is in the superior vena cava. No pneumothorax. There is aortic atherosclerosis. No adenopathy. No bone lesions. There is calcification in the left carotid artery. IMPRESSION: Status post aortic valve replacement. Stable cardiac silhouette. Lungs clear. Central catheter tip in superior vena cava. There is aortic atherosclerosis as well as calcification in the left carotid artery. Aortic Atherosclerosis (ICD10-I70.0). Electronically Signed   By: Bretta Bang III M.D.   On: 2018-02-11 10:43    EKG:2:1 heart block, RBBB, rate 42 (personally reviewed)  TELEMETRY: sinus rhythm 50-60's with Dopamine (personally reviewed)  Assessment/Plan: 1.  Heart block post TAVR in the setting of old RBBB He is currently stable on Dopamine I think he will require pacing He  wishes to discuss further with Dr Graciela HusbandsKlein Tentatively scheduled for this afternoon - I worry about his conduction system overnight without reliable back up pacing support  2.  Severe AS s/p TAVR Per structural heart team   Dr Graciela HusbandsKlein to see  later today   Signed, Gypsy BalsamAmber Seiler, NP 02/03/2018 10:16 AM   Patient seen and examined  2: 1 heart block status post TAVR  Aortic stenosis  Right bundle branch block-old  Coronary artery disease with prior stenting of his RCA with 70% residual disease in his LAD  Stroke   The patient has pre-existing right bundle branch block.  This is the strongest predictor for complete heart block requiring pacing following TAVR.  The sensitivity however is only about 30%.  The implications of heart block within the first 24 hours are not strongly predictive of need for permanent pacing.  Hence, I reviewed with Dr. Excell Seltzerooper and have agreed to withdraw catecholamines and to assess intrinsic AV nodal conduction.  Recent data have suggested that up to 10% of patients who have normal conduction post TAVR procedure at discharge can have high-grade heart block following discharge.  Outpatient monitoring has been considered and I think in this patient would be reasonable.  An M COT monitor using a ZIO Patch (AT) might be a very reasonable tool.  We will follow with you

## 2018-01-13 NOTE — Discharge Instructions (Signed)

## 2018-01-13 NOTE — Progress Notes (Signed)
Present to place PIV in Left arm.  Pt. Refusing any attempts and pulling arm away.

## 2018-01-13 NOTE — Evaluation (Signed)
Occupational Therapy Evaluation Patient Details Name: Samuel Vega MRN: [id number] DOB: 06/10/1924 Today's Date: 01/17/2018    History of Present Illness 84 yo with syncope with AS s/p TAVR 6/4. PMHx: right parietal CVA with left weakness 10/09/17, pTSD, hearing loss, HTN, HLD, macular degeneration   Clinical Impression   PTA, pt was living alone and requiring assistance for BADLs and using RW for functional mobility. Pt with family that assist with ADLs in morning and evenings and available as needed. Currently, pt requiring Mod A for UB ADLs, Max A for LB ADLs, and Mod A +2 for functional mobility with RW. Pt presenting with poor strength, balance, cognition, and activity tolerance. Pt would benefit from further acute OT to facilitate safe dc. Recommend dc to SNF for further OT to optimize safety, independence with ADLs, and return to PLOF.    Vitals as follows At rest: BP 120/51, SpO2 96% on RA, MAP in 80s After activity: BP 87/42, SpO2 90s on RA, HR 70, MAP 56 Supine at end of session: 105/40, SpO2 98% on RA, HR 69, MAP 58.  RN notified.     Follow Up Recommendations  SNF;Supervision/Assistance - 24 hour    Equipment Recommendations  Other (comment)(Defer to next venue)    Recommendations for Other Services PT consult     Precautions / Restrictions Precautions Precautions: Fall      Mobility Bed Mobility Overal bed mobility: Needs Assistance Bed Mobility: Supine to Sit;Sit to Supine     Supine to sit: Min assist;+2 for safety/equipment;HOB elevated     General bed mobility comments: MIn A for managing LLE towards EOB  Transfers Overall transfer level: Needs assistance Equipment used: Rolling walker (2 wheeled) Transfers: Sit to/from Stand Sit to Stand: Mod assist;+2 physical assistance         General transfer comment: Mod A +2 to power up into standing with blocking of left foot to prevent slidding. VCs for hand placement.    Balance Overall balance  assessment: Needs assistance Sitting-balance support: No upper extremity supported;Feet supported Sitting balance-Leahy Scale: Fair Sitting balance - Comments: Able to maintain static sitting at EOB   Standing balance support: During functional activity;Bilateral upper extremity supported Standing balance-Leahy Scale: Poor Standing balance comment: Reliant on physical A and UE support                           ADL either performed or assessed with clinical judgement   ADL Overall ADL's : Needs assistance/impaired Eating/Feeding: Set up;Sitting   Grooming: Min guard;Sitting   Upper Body Bathing: Moderate assistance;Sitting   Lower Body Bathing: Maximal assistance;+2 for physical assistance;Sit to/from stand   Upper Body Dressing : Moderate assistance;Sitting   Lower Body Dressing: Maximal assistance;+2 for physical assistance Lower Body Dressing Details (indicate cue type and reason): Max A to adjust socks.  Toilet Transfer: Moderate assistance;+2 for physical assistance;Ambulation;RW(Simulated to recliner)           Functional mobility during ADLs: Moderate assistance;+2 for safety/equipment;Rolling walker General ADL Comments: Pt presenting with poor funcitonal performance with decreased cognition, balance, strength, and awareness.     Vision Baseline Vision/History: Wears glasses;Macular Degeneration Patient Visual Report: No change from baseline       Perception     Praxis      Pertinent Vitals/Pain Pain Assessment: Faces Faces Pain Scale: Hurts even more Pain Location: Bilateral hips Pain Descriptors / Indicators: Constant;Discomfort     Hand Dominance Right  Extremity/Trunk Assessment Upper Extremity Assessment Upper Extremity Assessment: Generalized weakness;LUE deficits/detail   Lower Extremity Assessment Lower Extremity Assessment: Defer to PT evaluation   Cervical / Trunk Assessment Cervical / Trunk Assessment: Kyphotic    Communication Communication Communication: HOH   Cognition Arousal/Alertness: Awake/alert Behavior During Therapy: Restless Overall Cognitive Status: Impaired/Different from baseline Area of Impairment: Following commands;Safety/judgement;Awareness;Problem solving;Memory;Attention;Orientation                 Orientation Level: Disoriented to;Place;Time;Situation Current Attention Level: Sustained Memory: Decreased short-term memory Following Commands: Follows one step commands inconsistently;Follows one step commands with increased time Safety/Judgement: Decreased awareness of safety;Decreased awareness of deficits Awareness: Emergent Problem Solving: Slow processing;Difficulty sequencing;Decreased initiation;Requires verbal cues;Requires tactile cues General Comments: Pt requiring increased time and cues throughout session. Pt internally distractable and requiring cues to maintain attention to tasks   General Comments       Exercises     Shoulder Instructions      Home Living Family/patient expects to be discharged to:: Private residence Living Arrangements: Alone Available Help at Discharge: Family;Available PRN/intermittently Type of Home: House Home Access: Level entry     Home Layout: One level     Bathroom Shower/Tub: Producer, television/film/video: Handicapped height     Home Equipment: Environmental consultant - 2 wheels;Walker - 4 wheels;Wheelchair - manual;Shower seat;Grab bars - tub/shower          Prior Functioning/Environment Level of Independence: Needs assistance  Gait / Transfers Assistance Needed: Used RW for mobility mostly. Was working with PT on ambulating with RW.  ADL's / Homemaking Assistance Needed: Family assisting with ADLs including dressing and bathing. Pt able to self feed.   Comments: macular degeneration        OT Problem List: Decreased strength;Decreased range of motion;Decreased activity tolerance;Impaired balance (sitting and/or  standing);Decreased cognition;Decreased safety awareness;Decreased knowledge of use of DME or AE;Pain;Decreased knowledge of precautions;Impaired vision/perception      OT Treatment/Interventions: Self-care/ADL training;Therapeutic exercise;Energy conservation;DME and/or AE instruction;Therapeutic activities;Patient/family education    OT Goals(Current goals can be found in the care plan section) Acute Rehab OT Goals Patient Stated Goal: Go home OT Goal Formulation: With patient Time For Goal Achievement: 01/27/18 Potential to Achieve Goals: Good  OT Frequency: Min 2X/week   Barriers to D/C:            Co-evaluation PT/OT/SLP Co-Evaluation/Treatment: Yes Reason for Co-Treatment: Necessary to address cognition/behavior during functional activity;To address functional/ADL transfers;For patient/therapist safety   OT goals addressed during session: ADL's and self-care      AM-PAC PT "6 Clicks" Daily Activity     Outcome Measure Help from another person eating meals?: A Little Help from another person taking care of personal grooming?: A Little Help from another person toileting, which includes using toliet, bedpan, or urinal?: A Lot Help from another person bathing (including washing, rinsing, drying)?: A Lot Help from another person to put on and taking off regular upper body clothing?: A Little Help from another person to put on and taking off regular lower body clothing?: A Lot 6 Click Score: 15   End of Session Equipment Utilized During Treatment: Gait belt;Rolling walker Nurse Communication: Mobility status;Precautions  Activity Tolerance: Patient limited by pain;Patient limited by fatigue Patient left: with call bell/phone within reach;in bed;with bed alarm set;with SCD's reapplied  OT Visit Diagnosis: Unsteadiness on feet (R26.81);Other abnormalities of gait and mobility (R26.89);Muscle weakness (generalized) (M62.81);Other symptoms and signs involving cognitive  function;Pain;Low vision, both eyes (H54.2);History of falling (Z91.81)  Pain - Right/Left: (Bilateral) Pain - part of body: Hip(Hips)                Time: 1610-9604 OT Time Calculation (min): 46 min Charges:  OT General Charges $OT Visit: 1 Visit OT Evaluation $OT Eval Moderate Complexity: 1 Mod OT Treatments $Self Care/Home Management : 8-22 mins G-Codes:     Samuel Vega Samuel Vega, Samuel Vega Acute Rehab Pager: [phone] Office: [phone]  Samuel Vega 01-17-18, 1:47 PM

## 2018-01-13 NOTE — Progress Notes (Signed)
  Echocardiogram 2D Echocardiogram Limited post TAVR has been performed.  Leta JunglingCooper, Mykelti Goldenstein M 01/28/2018, 9:13 AM

## 2018-01-13 NOTE — Progress Notes (Signed)
Pt. Pulled out transvenous temp pacer. Cardiology paged to bedside. Dopamine started back up at 5 mcg. Pt responsive. HR in 100's b/p is stable with MAP of >65. Pt did have some nausea. Zofran given and effective. Pt is stable and resting in bed. Cardiology wants to medically manage for now.

## 2018-01-13 NOTE — Progress Notes (Signed)
Patient ID: Concha NorwayWilliam T Galea, male   DOB: 12/23/1923, 84 y.o.   MRN: [id number] TCTS Evening Rounds:  BP 101/50 P 67 on dopamine 2.5. Neo is off.  Alert and conversant. Some confusion at times according to family but he seems at baseline to me.  sats 100%

## 2018-01-13 NOTE — Telephone Encounter (Signed)
sch appt lvm 02/24/18 11am p/o MD

## 2018-01-13 NOTE — Progress Notes (Addendum)
[facility]   MULTIDISCIPLINARY HEART VALVE TEAM  Patient Name: Samuel Vega Date of Encounter: 01/25/2018  Primary Cardiologist: Dr. Katrinka BlazingSmith / Dr. Excell Seltzerooper & Dr. Laneta SimmersBartle (TAVR)  Hospital Problem List     Principal Problem:   S/P TAVR (transcatheter aortic valve replacement) Active Problems:   Macular degeneration   Aortic stenosis, severe   Hyperlipidemia   PTSD (post-traumatic stress disorder)   HTN (hypertension)   S/P right coronary artery (RCA) stent placement   Acute on chronic diastolic heart failure (HCC)     Subjective   Has some constipation and would like his MiraLAX reordered.  No chest pain or shortness of breath.  Getting his echo  Inpatient Medications    Scheduled Meds: . acetaminophen  1,000 mg Oral Q6H   Or  . acetaminophen (TYLENOL) oral liquid 160 mg/5 mL  1,000 mg Per Tube Q6H  . aspirin  81 mg Oral Daily  . atropine      . clopidogrel  75 mg Oral Q breakfast  . levETIRAcetam  500 mg Oral BID  . pantoprazole  40 mg Oral Daily  . polyethylene glycol  17 g Oral Daily   Continuous Infusions: . sodium chloride 50 mL/hr at 09-02-2017 1015  . sodium chloride    . albumin human    . cefUROXime (ZINACEF)  IV 1.5 g (02/05/2018 0856)  . DOPamine 5 mcg/kg/min (01/09/2018 0130)  . lactated ringers    . nitroGLYCERIN    . phenylephrine (NEO-SYNEPHRINE) Adult infusion 30 mcg/min (01/17/2018 0146)   PRN Meds: albumin human, lactated ringers, morphine injection, ondansetron (ZOFRAN) IV, oxyCODONE, traMADol   Vital Signs    Vitals:   02/03/2018 0500 02/01/2018 0600 01/17/2018 0700 01/22/2018 0715  BP: (!) 115/95 94/70 (!) 130/48 (!) 131/45  Pulse: 73 74 75 75  Resp: (!) 26 15 20 18   Temp:      TempSrc:      SpO2: 100% 98% 100% 100%  Weight: 157 lb 3 oz (71.3 kg)       Intake/Output Summary (Last 24 hours) at 01/28/2018 0904 Last data filed at 01/30/2018 0600 Gross per 24 hour  Intake 2340.48 ml  Output 370 ml  Net 1970.48 ml   Filed Weights   01/21/2018 0500  Weight: 157 lb 3 oz (71.3 kg)    Physical Exam   GEN: Well nourished, well developed, in no acute distress.  Elderly and frail HEENT: Grossly normal.  Neck: Supple, no JVD, carotid bruits, or masses. Cardiac: RRR, no murmurs, rubs, or gallops. No clubbing, cyanosis, edema.  Radials/DP/PT 2+ and equal bilaterally.  Respiratory:  Respirations regular and unlabored, clear to auscultation bilaterally. GI: Soft, nontender, nondistended, BS + x 4. MS: no deformity or atrophy. Skin: warm and dry, no rash.  Mild hematoma in left groin but soft. Neuro:  Strength and sensation are intact. Psych: AAOx3.  Normal affect.  Labs    CBC Recent Labs    09-02-2017 1257 01/10/2018 0523  WBC 4.0 11.1*  HGB 9.6* 10.6*  HCT 30.8* 33.6*  MCV 86.5 87.3  PLT 159 185   Basic Metabolic Panel Recent Labs    16/05/9600-23-2019 0929 09-02-2017 1024 01/30/2018 0523  NA 140 141 137  K 3.7 3.5 4.5  CL 102  --  109  CO2  --   --  22  GLUCOSE 122* 120* 143*  BUN 10  --  16  CREATININE 0.80  --  1.08  CALCIUM  --   --  8.6*  MG  --   --  1.9   Liver Function Tests No results for input(s): AST, ALT, ALKPHOS, BILITOT, PROT, ALBUMIN in the last 72 hours. No results for input(s): LIPASE, AMYLASE in the last 72 hours. Cardiac Enzymes No results for input(s): CKTOTAL, CKMB, CKMBINDEX, TROPONINI in the last 72 hours. BNP Invalid input(s): POCBNP D-Dimer No results for input(s): DDIMER in the last 72 hours. Hemoglobin A1C No results for input(s): HGBA1C in the last 72 hours. Fasting Lipid Panel No results for input(s): CHOL, HDL, LDLCALC, TRIG, CHOLHDL, LDLDIRECT in the last 72 hours. Thyroid Function Tests No results for input(s): TSH, T4TOTAL, T3FREE, THYROIDAB in the last 72 hours.  Invalid input(s): FREET3  Telemetry    intermittent sinus bradycardia and sinus rhythm - Personally Reviewed  ECG    Marked sinus bradycardia with RBBB- Personally Reviewed  Radiology    Dg Chest Port 1  View  Result Date: 01/21/2018 CLINICAL DATA:  Status post transcatheter aortic valve replacement EXAM: PORTABLE CHEST 1 VIEW COMPARISON:  Jan 08, 2018 FINDINGS: Lungs are clear. Heart is borderline enlarged with pulmonary vascularity normal. There is an aortic valve replacement. Central catheter tip is in the superior vena cava. No pneumothorax. There is aortic atherosclerosis. No adenopathy. No bone lesions. There is calcification in the left carotid artery. IMPRESSION: Status post aortic valve replacement. Stable cardiac silhouette. Lungs clear. Central catheter tip in superior vena cava. There is aortic atherosclerosis as well as calcification in the left carotid artery. Aortic Atherosclerosis (ICD10-I70.0). Electronically Signed   By: Bretta Bang III M.D.   On: 02/01/2018 10:43    Cardiac Studies    TAVR OPERATIVE NOTE   Date of Procedure:                01/22/2018  Preoperative Diagnosis:      Severe Aortic Stenosis   Postoperative Diagnosis:    Same   Procedure:        Transcatheter Aortic Valve Replacement - Percutaneous  Transfemoral Approach             Edwards Sapien 3 THV (size 23 mm, model # 9600TFX, serial # [id number])              Co-Surgeons:                        Alleen Borne, MD and Tonny Bollman, MD  Anesthesiologist:                  Arrie Aran, MD  Echocardiographer:              Tobias Alexander, MD  Pre-operative Echo Findings: ? Severe aortic stenosis ? Normal left ventricular systolic function  Post-operative Echo Findings: ? No paravalvular leak ? Normal (unchanged) left ventricular systolic function  ________________    Patient Profile     Samuel Vega is a 84 y.o. male with a history of PTSD, HTN, recent syncopal episode and R parietal stroke, carotid artery disease, seizures, CAD s/p DES to mRCA (3/27) and severe AS who presented to [facility] on 01/22/2018 for planned TAVR.   Assessment & Plan    Severe AS: s/p successful TAVR  with a 23 mm Edwards Sapien THV via the TF approach on 02/03/2018.  Postoperative echo pending. Groin site is stable. Continue aspirin and Plavix.  He still remains hypotensive requiring pressors.  Wean pressors as tolerated  Symptomatic bradycardia: he developed symptomatic, hemodynamically unstable junctional bradycardia  yesterday and required temporary pacemaker placement.  He accidentally pulled this out at 2 AM during a period of agitation.  His heart rate is currently stable.  We will have electrophysiology evaluate today for possible permanent pacemaker need.  HTN: patient currently hypotensive requiring pressor support. Echo pending this am.  CAD: s/p  DES to MRCA 11/04/17.  He will continue on aspirin and Plavix  Carotid artery stenosis: he has a 70 to 80% RICA stenosis that is being followed by Dr. Imogene Burn  Seizures: continue Keppra  Acute on chronic diastolic heart failure: As evidenced by elevated BNP on preadmission labs.  This is been treated with TAVR  Signed, Cline Crock, PA-C  01/16/2018, 9:04 AM  Pager [phone]  Patient seen, examined. Available data reviewed. Agree with findings, assessment, and plan as outlined by Carlean Jews, PA-C.  Pertinent exam findings include an elderly male in no acute distress.  JVP is normal.  Lungs are clear to auscultation bilaterally.  Heart is regular rate and rhythm with a 2/6 systolic ejection murmur at the right upper sternal border.  There is no diastolic murmur.  Bilateral groin sites are clear.  There is no peripheral edema.  Events overnight noted.  The patient pulled out his temporary pacing wire.  He remains on dopamine and Neo-Synephrine for hemodynamic support but his blood pressure appears stable.  Plans as outlined above.  Will attempt to wean inotropic agents.  Appreciate formal EP evaluation and consideration of permanent pacemaker.  He currently appears stable but again is on dopamine.  I reviewed his telemetry and he had episodes  of junctional bradycardia prior to dopamine initiation.  His postoperative echo is reviewed and shows normal transcatheter valve function.  Plan discussed with the patient and his nephew.  Tonny Bollman, M.D. 01/16/2018 5:18 PM

## 2018-01-13 NOTE — Evaluation (Signed)
Physical Therapy Evaluation Patient Details Name: Samuel Vega MRN: [id number] DOB: 03/13/1924 Today's Date: 02/01/2018   History of Present Illness  84 yo with syncope with AS s/p TAVR 6/4. PMHx: right parietal CVA with left weakness 10/09/17, pTSD, hearing loss, HTN, HLD, macular degeneration    Clinical Impression  Pt admitted with above diagnosis. Pt currently with functional limitations due to the deficits listed below (see PT Problem List). PTA, pt living alone with family support mornings and evenings. Upon eval pt presents with weakness and activity tolerance, requiring Mod A to stand and min A to stabilize while ambulating short distance before coming hypotensive. (see vitals below). Pt will benefit from skilled PT to increase their independence and safety with mobility to allow discharge to the venue listed below.    At rest: BP 120/51, SpO2 96% on RA, MAP in 80s After activity: BP 87/42, SpO2 90s on RA, HR 70, MAP 56 Supine at end of session: 105/40, SpO2 98% on RA, HR 69, MAP 58.  RN notified.      Follow Up Recommendations SNF;Supervision/Assistance - 24 hour    Equipment Recommendations  (TBD next venue)    Recommendations for Other Services       Precautions / Restrictions Precautions Precautions: Fall Restrictions Weight Bearing Restrictions: No      Mobility  Bed Mobility Overal bed mobility: Needs Assistance Bed Mobility: Supine to Sit;Sit to Supine     Supine to sit: Min assist;+2 for safety/equipment;HOB elevated     General bed mobility comments: MIn A for managing LLE towards EOB  Transfers Overall transfer level: Needs assistance Equipment used: Rolling walker (2 wheeled) Transfers: Sit to/from Stand Sit to Stand: Mod assist;+2 physical assistance         General transfer comment: Mod A +2 to power up into standing with blocking of left foot to prevent slidding. VCs for hand placement.  Ambulation/Gait Ambulation/Gait assistance: Min  assist Ambulation Distance (Feet): 20 Feet Assistive device: Rolling walker (2 wheeled) Gait Pattern/deviations: Step-to pattern;Step-through pattern Gait velocity: decreased   General Gait Details: pt with short distance ambulation, became hypotensive and requiring seated rest break. BP   Stairs            Wheelchair Mobility    Modified Rankin (Stroke Patients Only)       Balance Overall balance assessment: Needs assistance Sitting-balance support: No upper extremity supported;Feet supported Sitting balance-Leahy Scale: Fair Sitting balance - Comments: Able to maintain static sitting at EOB   Standing balance support: During functional activity;Bilateral upper extremity supported Standing balance-Leahy Scale: Poor Standing balance comment: Reliant on physical A and UE support                             Pertinent Vitals/Pain Pain Assessment: Faces Faces Pain Scale: Hurts even more Pain Location: Bilateral hips Pain Descriptors / Indicators: Constant;Discomfort    Home Living Family/patient expects to be discharged to:: Skilled nursing facility Living Arrangements: Alone Available Help at Discharge: Family;Available PRN/intermittently Type of Home: House Home Access: Level entry     Home Layout: One level Home Equipment: Walker - 2 wheels;Walker - 4 wheels;Wheelchair - manual;Shower seat;Grab bars - tub/shower      Prior Function Level of Independence: Needs assistance   Gait / Transfers Assistance Needed: Used RW for mobility mostly. Was working with PT on ambulating with RW.   ADL's / Homemaking Assistance Needed: Family assisting with ADLs including dressing and  bathing. Pt able to self feed.  Comments: macular degeneration     Hand Dominance   Dominant Hand: Right    Extremity/Trunk Assessment   Upper Extremity Assessment Upper Extremity Assessment: Generalized weakness    Lower Extremity Assessment Lower Extremity Assessment:  (BLE 4/5 )    Cervical / Trunk Assessment Cervical / Trunk Assessment: Kyphotic  Communication   Communication: HOH  Cognition Arousal/Alertness: Awake/alert Behavior During Therapy: Restless Overall Cognitive Status: Impaired/Different from baseline Area of Impairment: Following commands;Safety/judgement;Awareness;Problem solving;Memory;Attention;Orientation                 Orientation Level: Disoriented to;Place;Time;Situation Current Attention Level: Sustained Memory: Decreased short-term memory Following Commands: Follows one step commands inconsistently;Follows one step commands with increased time Safety/Judgement: Decreased awareness of safety;Decreased awareness of deficits Awareness: Emergent Problem Solving: Slow processing;Difficulty sequencing;Decreased initiation;Requires verbal cues;Requires tactile cues General Comments: Pt requiring increased time and cues throughout session. Pt internally distractable and requiring cues to maintain attention to tasks      General Comments      Exercises     Assessment/Plan    PT Assessment Patient needs continued PT services  PT Problem List Cardiopulmonary status limiting activity;Decreased strength;Decreased range of motion;Decreased activity tolerance;Decreased balance;Decreased mobility       PT Treatment Interventions DME instruction;Gait training;Stair training;Functional mobility training;Therapeutic activities;Therapeutic exercise;Balance training    PT Goals (Current goals can be found in the Care Plan section)  Acute Rehab PT Goals Patient Stated Goal: Go home PT Goal Formulation: With patient Time For Goal Achievement: 01/27/18 Potential to Achieve Goals: Fair    Frequency Min 2X/week   Barriers to discharge        Co-evaluation PT/OT/SLP Co-Evaluation/Treatment: Yes Reason for Co-Treatment: Necessary to address cognition/behavior during functional activity;For patient/therapist safety   OT  goals addressed during session: ADL's and self-care;Proper use of Adaptive equipment and DME;Strengthening/ROM       AM-PAC PT "6 Clicks" Daily Activity  Outcome Measure Difficulty turning over in bed (including adjusting bedclothes, sheets and blankets)?: Unable Difficulty moving from lying on back to sitting on the side of the bed? : Unable Difficulty sitting down on and standing up from a chair with arms (e.g., wheelchair, bedside commode, etc,.)?: Unable Help needed moving to and from a bed to chair (including a wheelchair)?: A Lot Help needed walking in hospital room?: A Lot Help needed climbing 3-5 steps with a railing? : Total 6 Click Score: 8    End of Session Equipment Utilized During Treatment: Gait belt Activity Tolerance: Patient limited by fatigue Patient left: in bed;with call bell/phone within reach;with nursing/sitter in room Nurse Communication: Mobility status PT Visit Diagnosis: Unsteadiness on feet (R26.81);Muscle weakness (generalized) (M62.81);Difficulty in walking, not elsewhere classified (R26.2)    Time: [id number] PT Time Calculation (min) (ACUTE ONLY): 57 min   Charges:   PT Evaluation $PT Eval Moderate Complexity: 1 Mod PT Treatments $Gait Training: 8-22 mins   PT G Codes:        Etta GrandchildSean Nollie Terlizzi, PT, DPT Acute Rehab Services Pager: [phone]    Etta GrandchildSean  Prateek Knipple 01/20/2018, 5:34 PM

## 2018-01-14 ENCOUNTER — Other Ambulatory Visit: Payer: Self-pay | Admitting: *Deleted

## 2018-01-14 ENCOUNTER — Ambulatory Visit: Payer: Self-pay | Admitting: *Deleted

## 2018-01-14 ENCOUNTER — Inpatient Hospital Stay (HOSPITAL_COMMUNITY): Payer: PPO | Admitting: Critical Care Medicine

## 2018-01-14 ENCOUNTER — Telehealth: Payer: Self-pay | Admitting: Cardiovascular Disease

## 2018-01-14 DIAGNOSIS — I469 Cardiac arrest, cause unspecified: Secondary | ICD-10-CM

## 2018-01-14 MED ORDER — EPINEPHRINE PF 1 MG/10ML IJ SOSY
PREFILLED_SYRINGE | INTRAMUSCULAR | Status: AC
  Filled 2018-01-14: qty 30

## 2018-01-14 MED ORDER — SODIUM BICARBONATE 8.4 % IV SOLN
INTRAVENOUS | Status: AC
  Filled 2018-01-14: qty 100

## 2018-01-14 MED ORDER — SODIUM BICARBONATE 8.4 % IV SOLN
INTRAVENOUS | Status: AC
  Filled 2018-01-14: qty 200

## 2018-01-14 MED FILL — Medication: Qty: 1 | Status: AC

## 2018-01-15 ENCOUNTER — Telehealth: Payer: Self-pay | Admitting: Cardiovascular Disease

## 2018-01-15 ENCOUNTER — Telehealth: Payer: Self-pay

## 2018-01-15 MED FILL — Magnesium Sulfate Inj 50%: INTRAMUSCULAR | Qty: 10 | Status: AC

## 2018-01-15 MED FILL — Sodium Chloride IV Soln 0.9%: INTRAVENOUS | Qty: 250 | Status: AC

## 2018-01-15 MED FILL — Heparin Sodium (Porcine) Inj 1000 Unit/ML: INTRAMUSCULAR | Qty: 30 | Status: AC

## 2018-01-15 MED FILL — Phenylephrine HCl IV Soln 10 MG/ML: INTRAVENOUS | Qty: 2 | Status: AC

## 2018-01-15 MED FILL — Potassium Chloride Inj 2 mEq/ML: INTRAVENOUS | Qty: 40 | Status: AC

## 2018-01-15 NOTE — Telephone Encounter (Signed)
Spoke with Sherrine MaplesGlenn. Expressed my condolences.

## 2018-01-15 NOTE — Telephone Encounter (Signed)
Original d/c received gave to Dr.Cooper to sign.

## 2018-01-15 NOTE — Telephone Encounter (Signed)
Original D/C signed By Dr.Cooper. [facility] for pick [facility].

## 2018-01-15 NOTE — Telephone Encounter (Signed)
Provider, FYI. 

## 2018-01-15 NOTE — Telephone Encounter (Signed)
Copied from CRM [phone]#[id number]. Topic: Quick Communication - Office Called Patient >> Jan 15, 2018  9:13 AM Gerrianne ScalePayne, Angela L wrote: Reason for CRM: [facility] calling stating that someone had called him about pt give him a call back at [phone] pt passed away yesterday

## 2018-01-19 ENCOUNTER — Encounter: Payer: Self-pay | Admitting: Thoracic Surgery (Cardiothoracic Vascular Surgery)

## 2018-01-26 NOTE — Telephone Encounter (Signed)
This was taken care of already and patient is now deceased

## 2018-01-27 ENCOUNTER — Ambulatory Visit: Payer: PPO | Admitting: Physician Assistant

## 2018-02-08 NOTE — Progress Notes (Signed)
Follow up from night Chaplain.  Family is sharing how much of a full life this patient lived so they are not sad.  They feel he is at peace and are enjoying the family members coming around visiting.  Thank you to the medical team that cared for him and his family.  Chaplains are available as needed.    02/02/2018 0847  Clinical Encounter Type  Visited With Family  Visit Type Follow-up;Death

## 2018-02-08 NOTE — Code Documentation (Signed)
  Patient Name: Concha NorwayWilliam T Privett   MRN: [id number]   Date of Birth/ Sex: 11/24/1923 , male      Admission Date: 01/31/2018  Attending Provider: Alleen BorneBartle, Bryan K, MD  Primary Diagnosis: S/P TAVR (transcatheter aortic valve replacement) [Z95.2]   Indication: Pt was in his usual state of health until this AM, when he was noted to be in PEA arrest. Code blue was subsequently called. At the time of arrival on scene, ACLS protocol was underway.   Technical Description:  - CPR performance duration:  30 minutes  - Was defibrillation or cardioversion used? Yes   - Was external pacer placed? No  - Was patient intubated pre/post CPR? Yes   Medications Administered: Y = Yes; Blank = No Amiodarone  y  Atropine    Calcium  y  Epinephrine  y  Lidocaine    Magnesium  y  Norepinephrine    Phenylephrine    Sodium bicarbonate  y  Vasopressin    Other    Post CPR evaluation:  - Final Status - Was patient successfully resuscitated ? No   Miscellaneous Information:  - Time of death:  6:55 AM  - Primary team notified?  Yes  - Family Notified? Yes     Gwynn BurlyWallace, Rowan Blaker, DO   01/18/2018, 7:01 AM

## 2018-02-08 NOTE — Patient Outreach (Signed)
[facility] Care Management  01/24/2018  Concha NorwayWilliam T Belvin 08/20/1923 [id number]   Noted that member coded this morning as inpatient, unable to resuscitate.  This care manager will close case at this time due to death.  Kemper DurieMonica Neko Mcgeehan, [place]RN, MSN [facility] Care [facility]  [facility] Care Manager [phone]

## 2018-02-08 NOTE — Progress Notes (Signed)
Patient is confused and trying to pull at lines.  He nearly pulled out his central line.  Line re-dressed. Mits placed.  RN will continue to monitor.

## 2018-02-08 NOTE — Telephone Encounter (Signed)
Sallye LatMike Sears called with [facility] asking if Dr.Cooper will sign d/c. I called them back let them know it shouldn't be a problem if they can drop off tomorrow 01/15/18 while he's in office.

## 2018-02-08 NOTE — Progress Notes (Signed)
Patient ID: Samuel Vega, male   DOB: 07/06/1924, 84 y.o.   MRN: [id number] Cardiothoracic Surgery  Patient developed PEA arrest this am after being up on bedside commode and getting back to bed. He had been up to commode 3 hrs earlier and was hypotensive afterward but recovered. He was intubated and CPR performed for 30 min with ACLS protocol but in and out of VF and progressed to asystole. Limited echo performed at bedside by Dr. Excell Seltzerooper and no pericardial effusion seen. Code called after 30 minutes. Nephew at bedside. Patient had 3-vessel CAD with DES placed in tight mid RCA lesion in 10/2017 but Plavix and ASA continued. He also had calcified 70% proximal and mid LAD lesions. It is possible that this arrest could have been due to stent occlusion or other coronary event, or possibly a pulmonary embolism although he was not complaining of shortness of breath.

## 2018-02-08 NOTE — Death Summary Note (Signed)
[facility]   MULTIDISCIPLINARY HEART VALVE TEAM   Discharge Summary    Patient ID: Samuel Vega,  MRN: [id number], DOB/AGE: 82/04/1924 82 y.o.  Admit date: 02/04/2018 Discharge date: 01/26/2018  Primary Care Provider: Porfirio OarJeffery, Chelle Primary Cardiologist: Dr. Katrinka BlazingSmith / Dr. Excell Seltzerooper & Dr. Laneta SimmersBartle (TAVR)   Discharge Diagnoses    Principal Problem:   S/P TAVR (transcatheter aortic valve replacement) Active Problems:   Macular degeneration   Aortic stenosis, severe   Hyperlipidemia   PTSD (post-traumatic stress disorder)   HTN (hypertension)   S/P right coronary artery (RCA) stent placement   Acute on chronic diastolic heart failure (HCC)   PEA (Pulseless electrical activity) (HCC)   Allergies Allergies  Allergen Reactions  . Statins Other (See Comments)    MYALGIAS     History of Present Illness       Samuel Vega is a 84 y.o. male with a history of PTSD, HTN, recent syncopal episode and R parietal stroke, carotid artery disease, seizures, CAD s/p DES to mRCA (3/27) and severe AS who [facility] for planned TAVR.   This 84 year old gentleman had stage D, critical, symptomatic aortic stenosis with New York Heart Association class II symptoms of exertional fatigue and tiredness consistent with chronic diastolic congestive heart failure. He presented with an acute ischemic right brain stroke on 10/09/2017 which is likely due to a combination of a 70 to 80% right carotid bulb stenosis in addition to critical aortic stenosis. He has made a good recovery from that and went home home and was independent. In addition, he also had three-vessel coronary disease and underwent PCI stenting of a high-grade mid right coronary stenosis on 11/04/2017. He denies any anginal symptoms at this time.   He was evaluated by the multidisciplinary valve team and felt to be a suitable candidate for TAVR, which was set up for 02/04/2018.  Hospital Course       Consultants: none  He underwent successful TAVR with a 23 mm Edwards Sapien THV via the TF approach on 01/20/2018.  Postoperative echo showed EF 60% with a normally functioning TAVR valve with trivial paravalvular leak and a mean gradient of 13 mmHg.  The patient then developed symptomatic, hemodynamically unstable junctional bradycardia and required temporary pacemaker placement.  He accidentally pulled this out the following night around 2 AM during a period of agitation.  Electrophysiology evaluated and felt he was high risk for conduction abnormalities, but outpatient heart rate monitoring was reasonable as well.  The patient continued to have mild hypotension requiring pressor support.  On the morning of 01/15/2018 the patient developed PEA arrest after being up on bedside commode and getting back into bed. He was intubated and CPR performed for 30 min with ACLS protocol but in and out of VF and progressed to asystole. Limited echo performed at bedside by Dr. Excell Seltzerooper and no pericardial effusion seen. Code called after 30 minutes.  His nephew was at his bedside who ultimately felt like CPR could be discontinued.  It was felt that his arrest could possibly be due to a stent occlusion of recently placed cardiac stent or other coronary event or possibly a pulmonary embolism. His time of death was 6:55 AM.  The chaplain has been in to visit and he has family at his bedside.  _____________  Discharge Vitals Blood pressure 107/90, pulse 62, temperature 99.4 F (37.4 C), temperature source Axillary, resp. rate (!) 24, weight 157 lb 3 oz (71.3 kg),  SpO2 100 %.  Filed Weights   2018-02-03 0500  Weight: 157 lb 3 oz (71.3 kg)    Labs & Radiologic Studies     CBC Recent Labs    01/11/2018 1257 2018/02/03 0523  WBC 4.0 11.1*  HGB 9.6* 10.6*  HCT 30.8* 33.6*  MCV 86.5 87.3  PLT 159 185   Basic Metabolic Panel Recent Labs    16/10/96 0929 02/06/2018 1024 2018/02/03 0523  NA 140 141 137  K 3.7 3.5 4.5  CL  102  --  109  CO2  --   --  22  GLUCOSE 122* 120* 143*  BUN 10  --  16  CREATININE 0.80  --  1.08  CALCIUM  --   --  8.6*  MG  --   --  1.9   Liver Function Tests No results for input(s): AST, ALT, ALKPHOS, BILITOT, PROT, ALBUMIN in the last 72 hours. No results for input(s): LIPASE, AMYLASE in the last 72 hours. Cardiac Enzymes No results for input(s): CKTOTAL, CKMB, CKMBINDEX, TROPONINI in the last 72 hours. BNP Invalid input(s): POCBNP D-Dimer No results for input(s): DDIMER in the last 72 hours. Hemoglobin A1C No results for input(s): HGBA1C in the last 72 hours. Fasting Lipid Panel No results for input(s): CHOL, HDL, LDLCALC, TRIG, CHOLHDL, LDLDIRECT in the last 72 hours. Thyroid Function Tests No results for input(s): TSH, T4TOTAL, T3FREE, THYROIDAB in the last 72 hours.  Invalid input(s): FREET3  Dg Chest 2 View  Result Date: 01/09/2018 CLINICAL DATA:  Preoperative study prior to aortic valve replacement EXAM: CHEST - 2 VIEW COMPARISON:  None. FINDINGS: Anterior wedging of lower thoracic vertebral bodies is age indeterminate but may be nonacute given history. Recommend clinical correlation. The heart, hila, mediastinum, lungs, and pleura are unremarkable. IMPRESSION: Anterior wedging of lower thoracic vertebral bodies is age indeterminate. Recommend clinical correlation. No other abnormalities. Electronically Signed   By: Gerome Sam III M.D   On: 01/09/2018 09:34   Dg Chest Port 1 View  Result Date: 02/01/2018 CLINICAL DATA:  Status post transcatheter aortic valve replacement EXAM: PORTABLE CHEST 1 VIEW COMPARISON:  Jan 08, 2018 FINDINGS: Lungs are clear. Heart is borderline enlarged with pulmonary vascularity normal. There is an aortic valve replacement. Central catheter tip is in the superior vena cava. No pneumothorax. There is aortic atherosclerosis. No adenopathy. No bone lesions. There is calcification in the left carotid artery. IMPRESSION: Status post aortic valve  replacement. Stable cardiac silhouette. Lungs clear. Central catheter tip in superior vena cava. There is aortic atherosclerosis as well as calcification in the left carotid artery. Aortic Atherosclerosis (ICD10-I70.0). Electronically Signed   By: Bretta Bang III M.D.   On: 01/18/2018 10:43     Diagnostic Studies/Procedures    TAVR OPERATIVE NOTE   Date of Procedure:01/21/2018  Preoperative Diagnosis:Severe Aortic Stenosis   Postoperative Diagnosis:Same   Procedure:   Transcatheter Aortic Valve Replacement - Percutaneous Transfemoral Approach Edwards Sapien 3 THV (size 23mm, model # 9600TFX, serial # [id number])  Co-Surgeons:Bryan Jennefer Bravo, MD and Tonny Bollman, MD  Anesthesiologist:Stephen Desmond Lope, MD  Delano Metz, MD  Pre-operative Echo Findings: ? Severe aortic stenosis ? Normalleft ventricular systolic function  Post-operative Echo Findings: ? Noparavalvular leak ? Normal (unchanged)left ventricular systolic function   _____________   Post operative echo 2018/02/03 Study Conclusions - Left ventricle: The cavity size was normal. Wall thickness was   increased in a pattern of severe LVH. Systolic function was   normal. The estimated ejection fraction was  in the range of 60%   to 65%. - Aortic valve: s/p Edwards Sapien 3 THV (23 mm) - well seated,   trivial paravalvular leak, no obstruction. Mean gradient (S): 13   mm Hg. Peak gradient (S): 23 mm Hg. Valve area (VTI): 1.42 cm^2.   Valve area (Vmax): 1.54 cm^2. Valve area (Vmean): 1.41 cm^2. - Mitral valve: Calcified annulus. Mildly thickened leaflets .   There was mild to moderate regurgitation. - Tricuspid valve: There was mild regurgitation. - Pulmonary arteries: PA peak pressure: 55 mm Hg (S). - Inferior vena cava: The vessel was dilated. The respirophasic   diameter changes  were blunted (< 50%), consistent with elevated   central venous pressure. Impressions: - Compared to a prior study on 02/03/2018, there has been interval   placement of an Edwards Sapien 3 THV (23 mm) which is well   seated. There is a trivial perivalvular leak with a mean gradient   of 13 mmHg. There is mild to moderate MR and mild TR with an RVSP   of 55 mmHg and the IVC is dilated, suggestive of elevated filling   pressure.   Disposition   Pt is being discharged home today in good condition.  Follow-up Plans & Appointments    None  Discharge Medications   none   Outstanding Labs/Studies   none  Duration of Discharge Encounter   Greater than 30 minutes including physician time.  Signed, Cline Crock PA-C 02/09/2018, 10:37 AM

## 2018-02-08 NOTE — Patient Outreach (Signed)
[facility] Care Management  01/29/2018  Concha NorwayWilliam T Budlong 06/21/1924 [id number]   CSW was advised of pt's death today. CSW will close case referral and offer outreach of sympathy to pt's family.   Reece LevyJanet Bayard More, MSW, LCSW Clinical Social Worker  [facility] [phone]

## 2018-02-08 NOTE — Progress Notes (Signed)
At little after 0600 the patient requested to use the bathroom.  Nephew in room said that he needs to use the bedside commode.  Of note, we had gotten him up to use the bedside commode about 3 hours earlier.  He was hypotensive afterwards but recovered.  I explained to the nephew that he probably didn't need to get up to to use the bedside commode, but he insisted that he wouldn't use the bedpan.  (during the day yesterday he had been getting up and down to use the bedside commode)    The patient was weak while on the bedside commode and the nephew was holding him up.  The patient said he wanted to go back to bed.  We put him back into the bed and he subsequently became increasingly lethargic, agonally breathing, and eventually PEA arrested.  ACLS protocol was carried out.  The Code team, Dr. Excell Seltzerooper, and Dr. Laneta SimmersBartle came to the bedside.  Dr. Excell Seltzerooper performed bedside Echo.  The patient was intubated by anesthesia.  He was in and out of vfib which progressed to asystole.    He was coded for 30 minutes and the MDs spoke with the nephew who agreed to us ceasing the code.  The patient unfortunately passed around [phone].  The family is at the bedside.

## 2018-02-08 NOTE — Anesthesia Procedure Notes (Signed)
Procedure Name: Intubation Date/Time: 2018-01-15 6:33 AM Performed by: Wilburn Cornelia, CRNA Pre-anesthesia Checklist: Patient identified, Emergency Drugs available, Suction available, Patient being monitored and Timeout performed Patient Re-evaluated:Patient Re-evaluated prior to induction Oxygen Delivery Method: Ambu bag Preoxygenation: Pre-oxygenation with 100% oxygen Ventilation: Mask ventilation without difficulty Laryngoscope Size: Mac and 3 Grade View: Grade III Tube type: Subglottic suction tube Tube size: 8.0 mm Number of attempts: 1 Airway Equipment and Method: Stylet Placement Confirmation: ETT inserted through vocal cords under direct vision,  positive ETCO2,  CO2 detector and breath sounds checked- equal and bilateral Secured at: 23 cm Tube secured with: Tape Dental Injury: Teeth and Oropharynx as per pre-operative assessment

## 2018-02-08 DEATH — deceased

## 2018-02-17 ENCOUNTER — Other Ambulatory Visit (HOSPITAL_COMMUNITY): Payer: PPO

## 2018-02-17 ENCOUNTER — Ambulatory Visit: Payer: PPO | Admitting: Physician Assistant

## 2018-02-24 ENCOUNTER — Encounter: Payer: PPO | Admitting: Vascular Surgery

## 2018-03-24 ENCOUNTER — Ambulatory Visit: Payer: PPO | Admitting: Adult Health

## 2019-07-08 IMAGING — CT CT HEAD W/O CM
3 of 4 series · 13 of 47 positions shown, 15 images · non-contrast
Comparison: 10/09/2017 an MRI 10/09/2017

CLINICAL DATA: Stroke 10/08/2016. Left-sided tremors today. History
of dementia.

EXAM:
CT HEAD WITHOUT CONTRAST
TECHNIQUE: Contiguous axial images were obtained from the base of the skull
through the vertex without intravenous contrast.

[Series 3: head wo · axial · 0.47mm/px · z∈[-80,+40]mm · 7 of 33 slices shown, 9 images]
[im 5/33  brain]
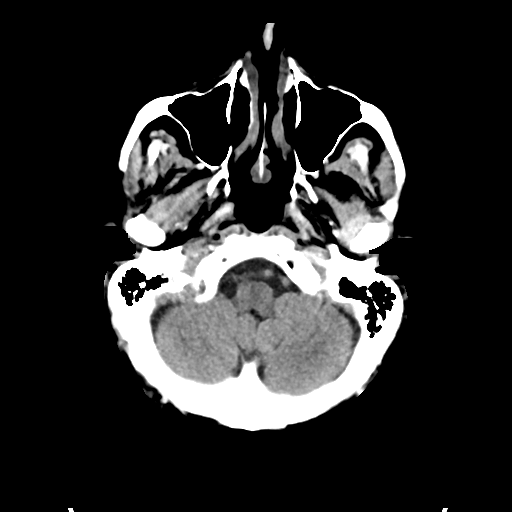
[im 5/33  bone]
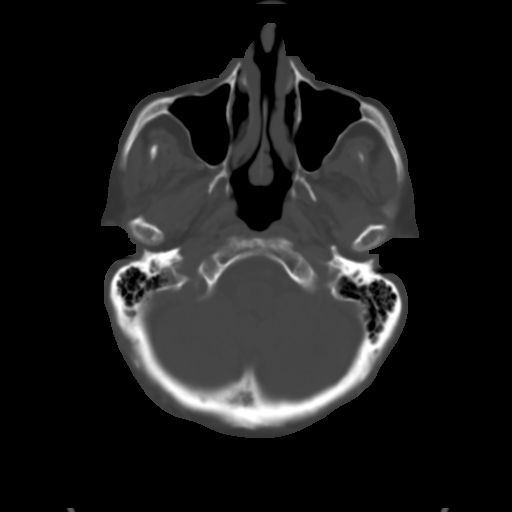
[im 9/33  brain]
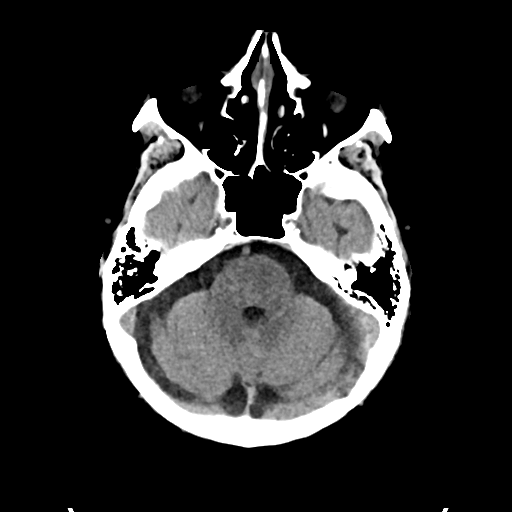
[im 13/33  brain]
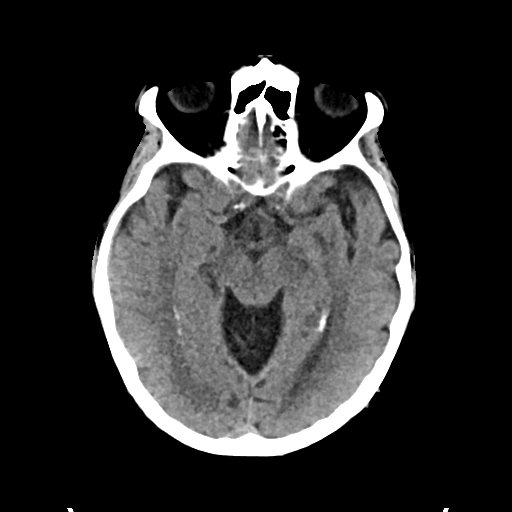
[im 17/33  brain]
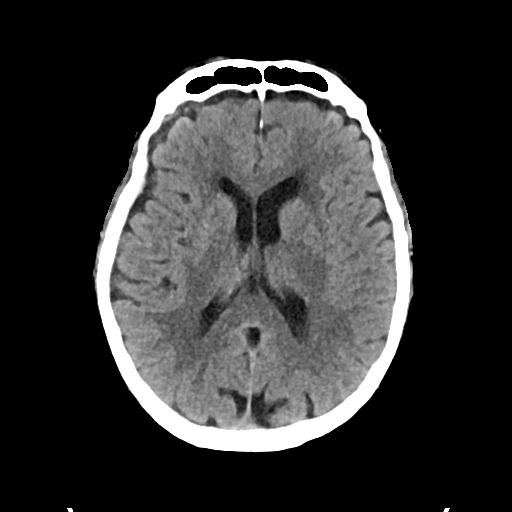
[im 21/33  brain]
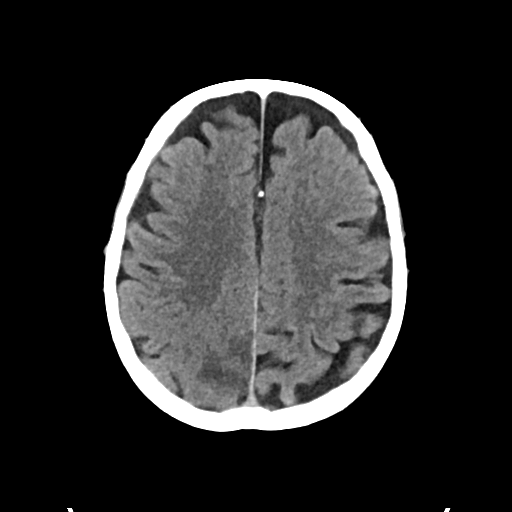
[im 21/33  bone]
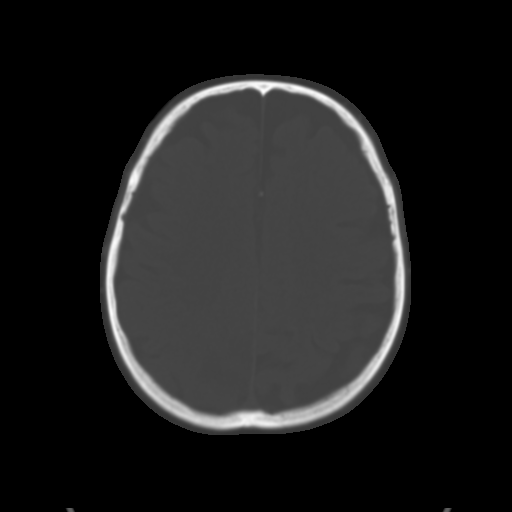
[im 25/33  brain]
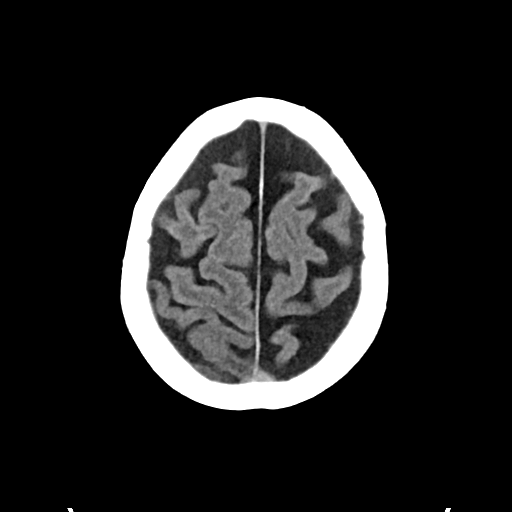
[im 29/33  brain]
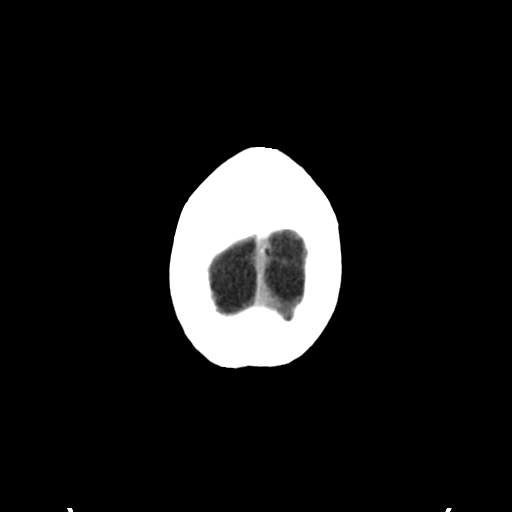

[Series 5: cor soft · coronal · 0.34mm/px · 3 of 77 slices shown]
[im 26/77  brain]
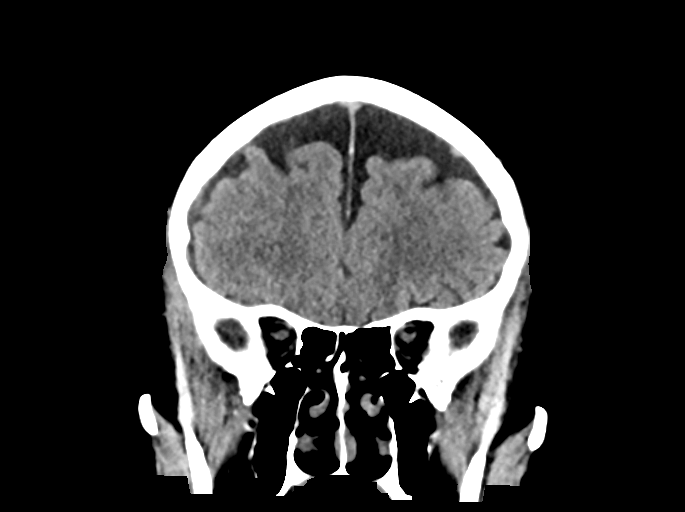
[im 34/77  brain]
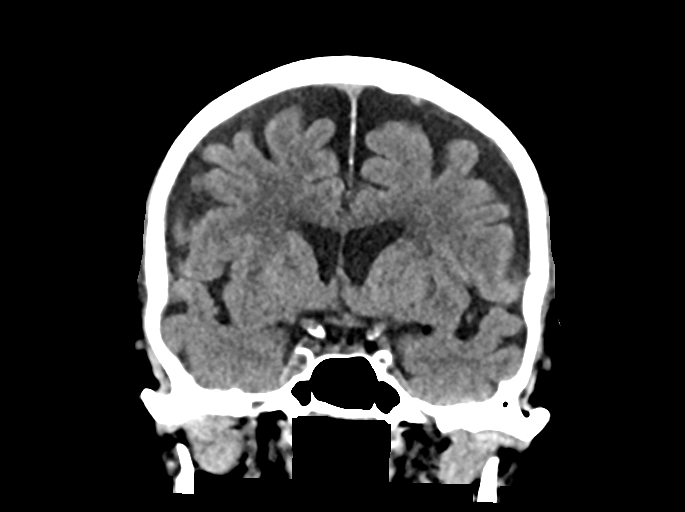
[im 43/77  brain]
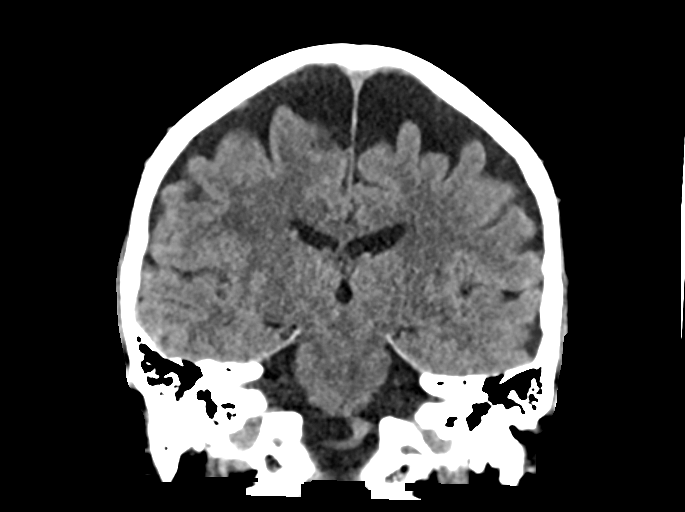

[Series 6: sag soft · sagittal · 0.38mm/px · 3 of 67 slices shown]
[im 23/67  brain]
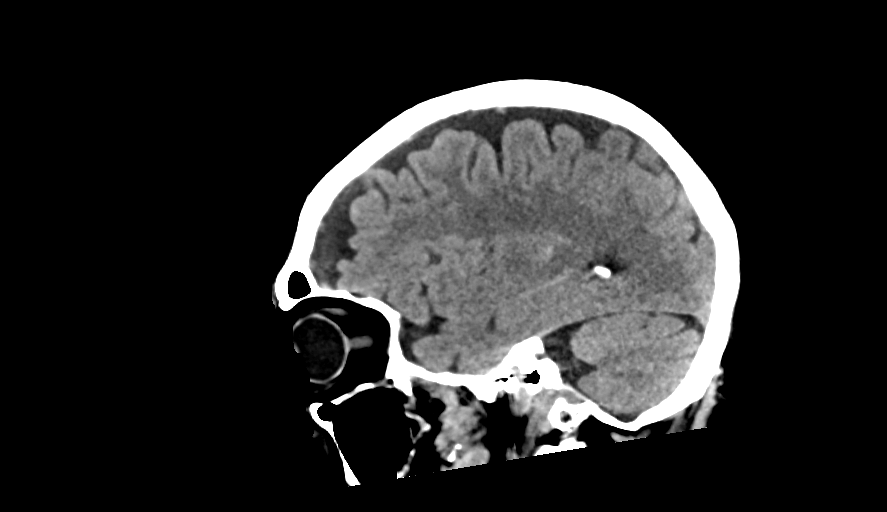
[im 34/67  brain]
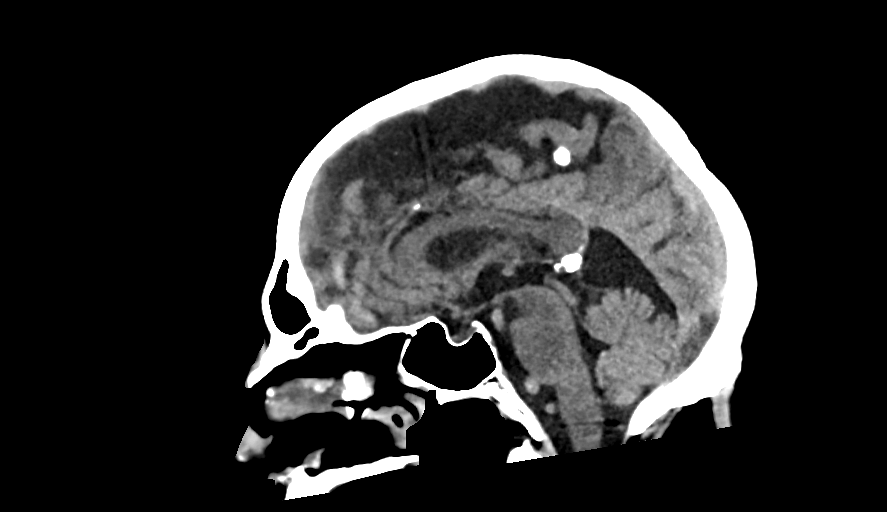
[im 45/67  brain]
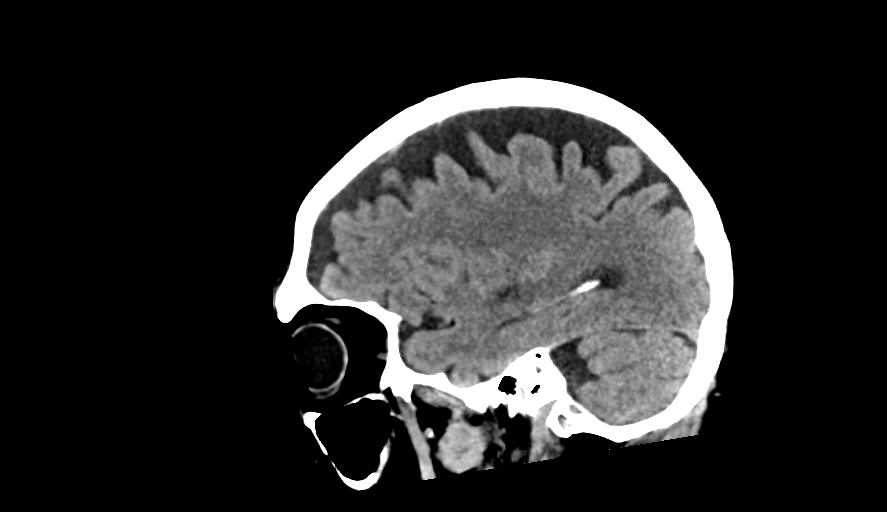

[13 of 47 positions shown; findings below may reference images not displayed]

FINDINGS: Brain: Ventricles, cisterns and other CSF spaces within normal.
There is evidence of patient's known subacute posterior right
parietal infarct without significant change in size or distribution
compared to recent MRI. Evidence of minimal chronic ischemic
microvascular disease. No mass, significant mass effect or midline
shift. No evidence of acute hemorrhage.

Vascular: No hyperdense vessel or unexpected calcification.

Skull: Normal. Negative for fracture or focal lesion.

Sinuses/Orbits: No acute finding.

Other: None.
IMPRESSION: Evidence of patient's known subacute infarct over the right
posterior parietal region without significant change. No new areas
of infarction and no acute hemorrhage.

Mild chronic ischemic microvascular disease.

## 2019-07-08 IMAGING — MR MR HEAD W/O CM
9 of 10 series · 34 of 48 positions shown · IV contrast (Yes MH)
Comparison: MRI brain 10/09/2017.  CT head 10/26/2017.

CLINICAL DATA: Continued surveillance, recent stroke.Abdominal
spasms.

EXAM:
MRI HEAD WITHOUT CONTRAST
TECHNIQUE: Multiplanar, multiecho pulse sequences of the brain and surrounding
structures were obtained without intravenous contrast.

[Series 3: DWI · axial · 3.0mm · 0.94mm/px · z∈[-43,+116]mm · 8 of 108 slices shown (1 of 2)]
[im 1/108]
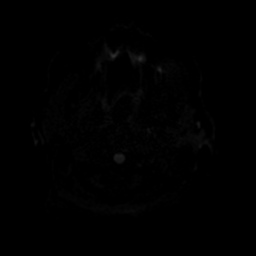
[im 12/108]
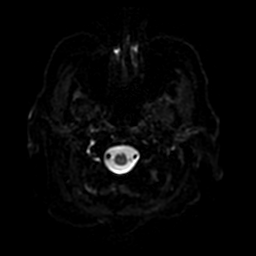
[im 36/108]
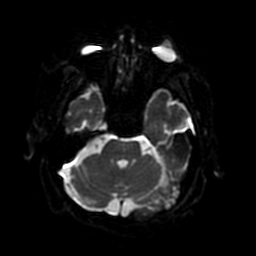
[im 48/108]
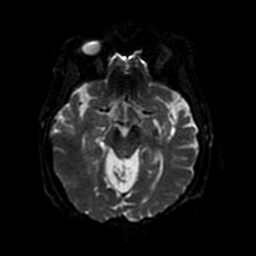
[im 60/108]
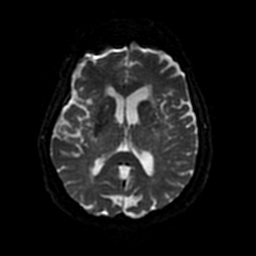
[im 72/108]
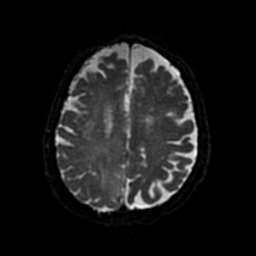
[im 96/108]
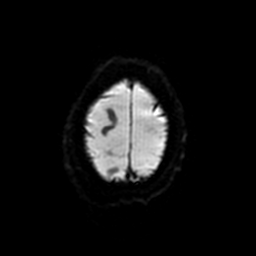
[im 108/108]
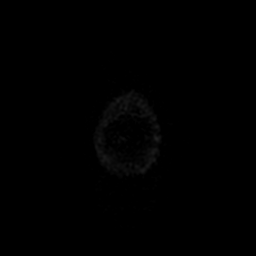

[Series 4: DWI · coronal · 4.0mm · 0.94mm/px · 6 of 72 slices shown (2 of 2)]
[im 1/72]
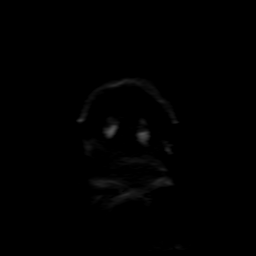
[im 15/72]
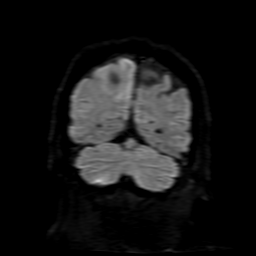
[im 29/72]
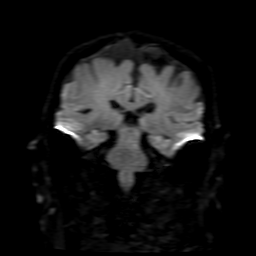
[im 43/72]
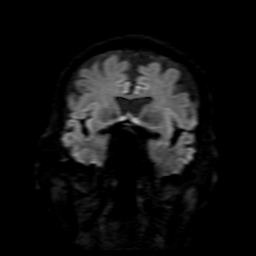
[im 57/72]
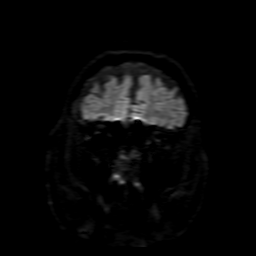
[im 72/72]
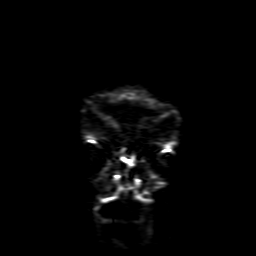

[Series 5: FLAIR · sagittal · 5.0mm · 0.47mm/px · 2 of 26 slices shown (1 of 2)]
[im 1/26]
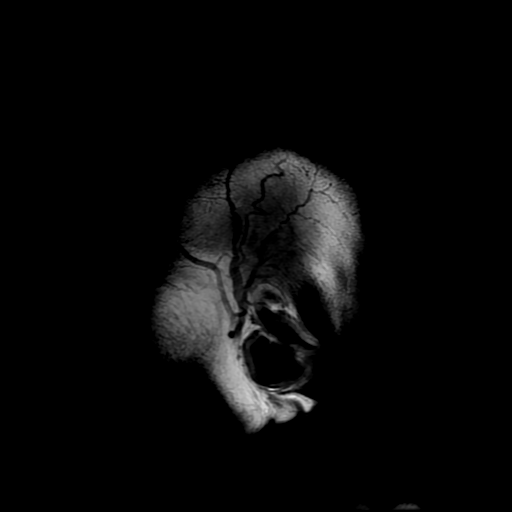
[im 26/26]
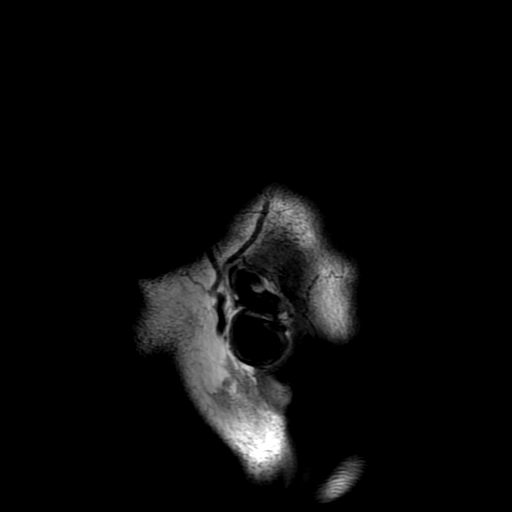

[Series 6: T2 · axial · 5.0mm · 0.47mm/px · z∈[-42,+114]mm · 2 of 27 slices shown]
[im 1/27]
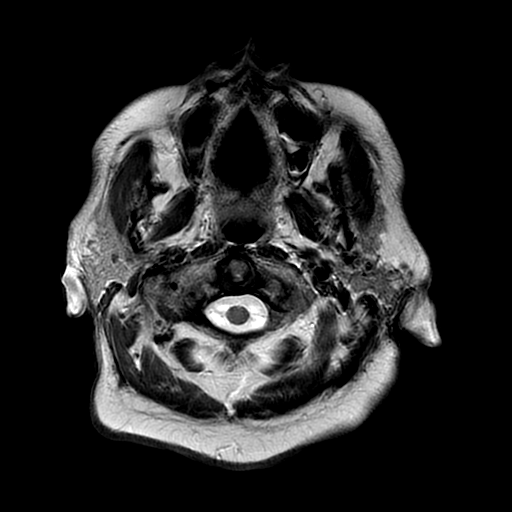
[im 27/27]
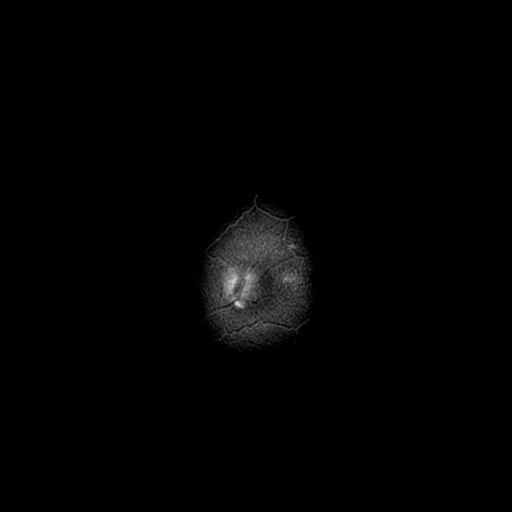

[Series 7: FLAIR · axial · 3.0mm · 0.47mm/px · z∈[-42,+114]mm · 2 of 27 slices shown (2 of 2)]
[im 1/27]
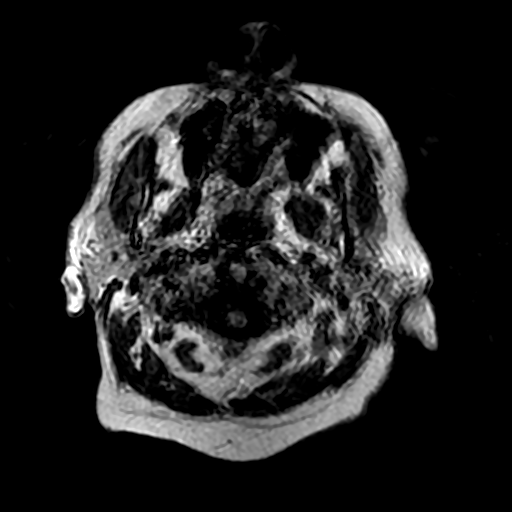
[im 27/27]
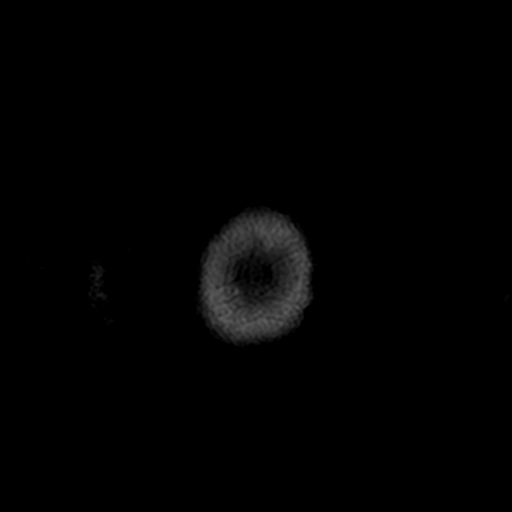

[Series 8: (person_name) · axial · 3.0mm · 0.47mm/px · z∈[-43,+9]mm · 3 of 108 slices shown]
[im 1/108]
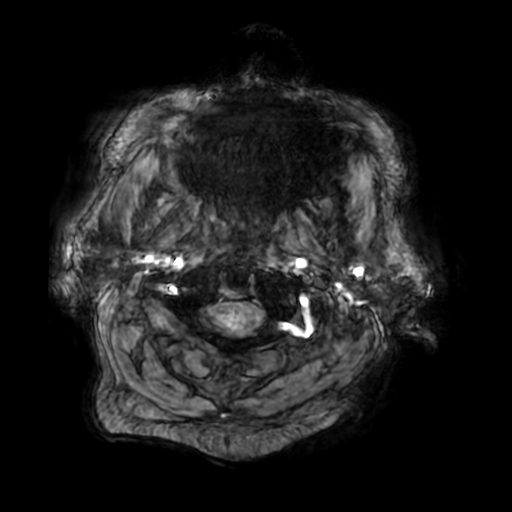
[im 12/108]
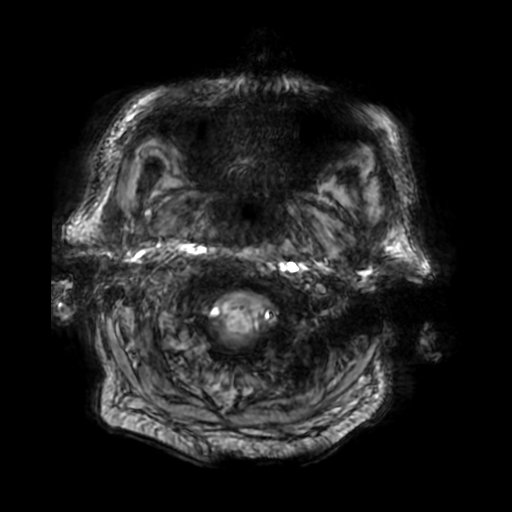
[im 36/108]
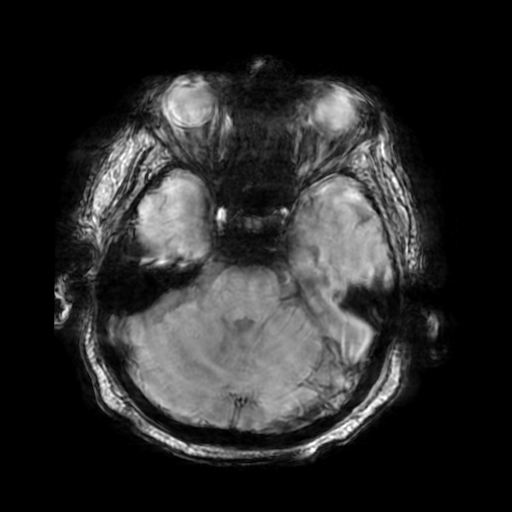

[Series 10: T2 post-contrast · coronal · 5.0mm · 0.47mm/px · 3 of 30 slices shown]
[im 1/30]
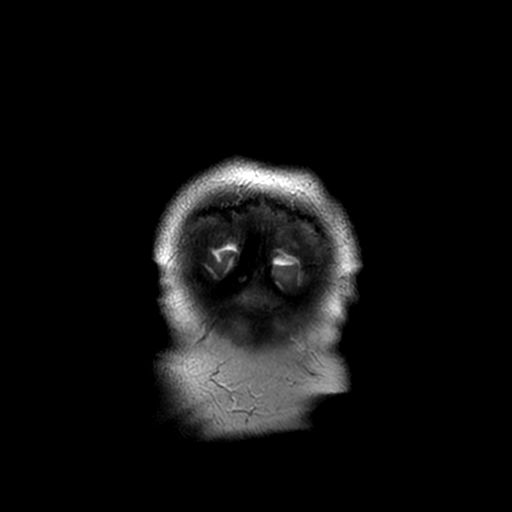
[im 15/30]
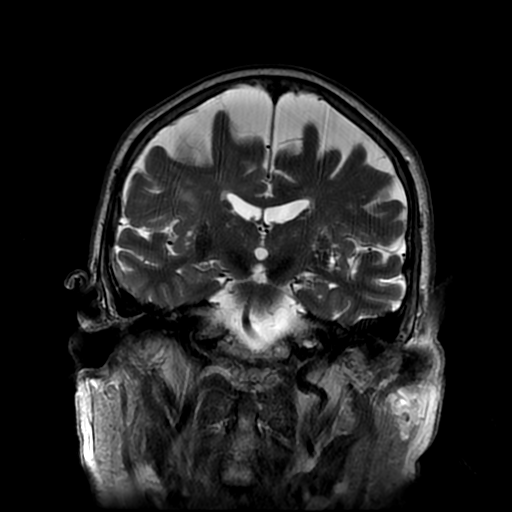
[im 30/30]
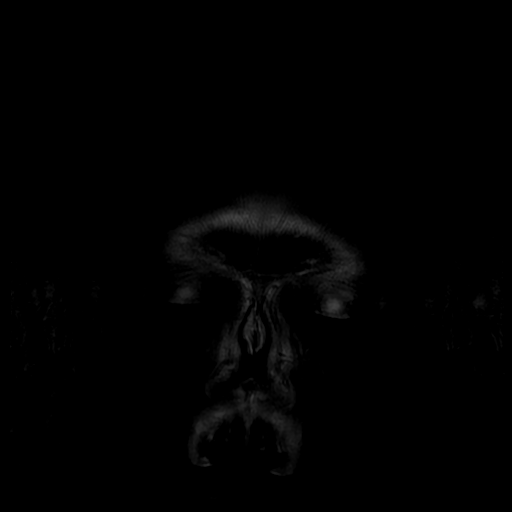

[Series 350: ADC · axial · 3.0mm · 0.94mm/px · z∈[-43,+116]mm · 5 of 54 slices shown (1 of 2)]
[im 1/54]
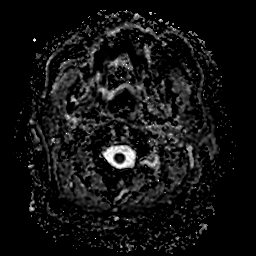
[im 14/54]
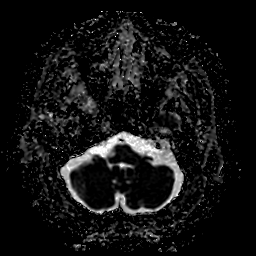
[im 27/54]
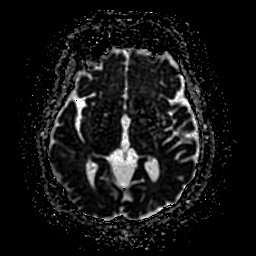
[im 40/54]
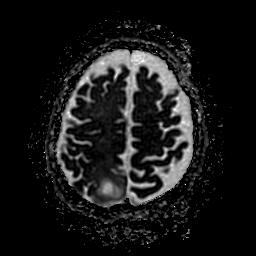
[im 54/54]
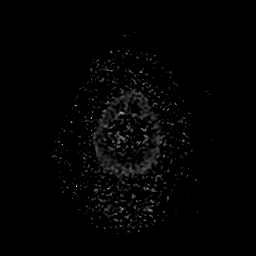

[Series 450: ADC · coronal · 4.0mm · 0.94mm/px · 3 of 36 slices shown (2 of 2)]
[im 1/36]
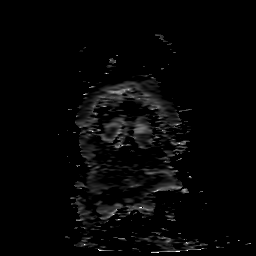
[im 18/36]
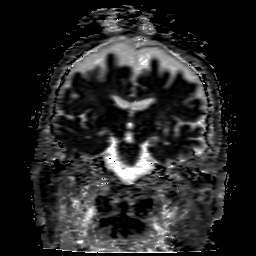
[im 36/36]
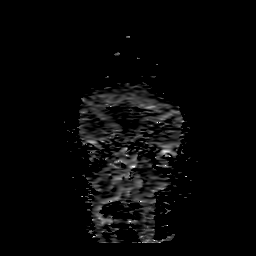

[34 of 48 positions shown; findings below may reference images not displayed]

FINDINGS: Brain: Subacute infarction redemonstrated in the RIGHT parietal
lobe, central encephalomalacia, T2 and FLAIR hyperintensity,
normalizing ADC with only mild restricted diffusion. Developing
encephalomalacia centrally. No new areas of infarction.

There is a small subacute subdural hematoma, RIGHT occipital
subdural space, maximum 3 mm thickness, reference image 16 series 7,
which in retrospect was present on the previous exam. There is no
mass effect on the underlying brain. No midline shift. There was
also a RIGHT subdural hygroma, which is essentially normalized on
today's exam.

Atrophy and small vessel disease are stable.

Vascular: Flow voids are maintained.

Skull and upper cervical spine: Normal marrow signal.

Sinuses/Orbits: Negative.

Other: None.
IMPRESSION: Typical evolutionary change for subacute RIGHT parietal infarction,
without new areas of cerebral infarction.

RIGHT subdural hygroma has essentially resolved, but there is
residual subacute RIGHT 3 mm thick subdural hematoma, also present
previously, and unchanged. This is a nonsurgical lesion.

Atrophy and small vessel disease, not unexpected for age.
